# Patient Record
Sex: Female | Born: 1935 | Race: White | Hispanic: No | Marital: Single | Smoking: Never smoker
Health system: Southern US, Community
[De-identification: ages and names within clinical notes are randomized; demographics above are authoritative.]

## PROBLEM LIST (undated history)

## (undated) DIAGNOSIS — Z95810 Presence of automatic (implantable) cardiac defibrillator: Secondary | ICD-10-CM

## (undated) DIAGNOSIS — C8590 Non-Hodgkin lymphoma, unspecified, unspecified site: Secondary | ICD-10-CM

## (undated) DIAGNOSIS — R32 Unspecified urinary incontinence: Secondary | ICD-10-CM

## (undated) DIAGNOSIS — I10 Essential (primary) hypertension: Secondary | ICD-10-CM

## (undated) DIAGNOSIS — M21959 Unspecified acquired deformity of unspecified thigh: Secondary | ICD-10-CM

## (undated) DIAGNOSIS — G629 Polyneuropathy, unspecified: Secondary | ICD-10-CM

## (undated) DIAGNOSIS — R351 Nocturia: Secondary | ICD-10-CM

## (undated) DIAGNOSIS — F329 Major depressive disorder, single episode, unspecified: Secondary | ICD-10-CM

## (undated) DIAGNOSIS — F32A Depression, unspecified: Secondary | ICD-10-CM

## (undated) DIAGNOSIS — N3941 Urge incontinence: Secondary | ICD-10-CM

## (undated) DIAGNOSIS — R000 Tachycardia, unspecified: Secondary | ICD-10-CM

## (undated) DIAGNOSIS — I4891 Unspecified atrial fibrillation: Secondary | ICD-10-CM

## (undated) DIAGNOSIS — I509 Heart failure, unspecified: Secondary | ICD-10-CM

## (undated) HISTORY — DX: Urge incontinence: N39.41

## (undated) HISTORY — DX: Unspecified urinary incontinence: R32

## (undated) HISTORY — DX: Non-Hodgkin lymphoma, unspecified, unspecified site: C85.90

## (undated) HISTORY — DX: Polyneuropathy, unspecified: G62.9

## (undated) HISTORY — PX: TOTAL HIP ARTHROPLASTY: SHX124

## (undated) HISTORY — DX: Depression, unspecified: F32.A

## (undated) HISTORY — DX: Essential (primary) hypertension: I10

## (undated) HISTORY — PX: BONE MARROW TRANSPLANT: SHX200

## (undated) HISTORY — DX: Nocturia: R35.1

## (undated) HISTORY — DX: Tachycardia, unspecified: R00.0

## (undated) HISTORY — PX: COMPLICATED TOTAL HIP PROSTHESIS AND METHYLMETHACRALATE REMOVAL W/O SPACER INSERTION: SHX1385

## (undated) HISTORY — DX: Unspecified acquired deformity of unspecified thigh: M21.959

## (undated) HISTORY — DX: Major depressive disorder, single episode, unspecified: F32.9

---

## 2006-11-13 DIAGNOSIS — C8599 Non-Hodgkin lymphoma, unspecified, extranodal and solid organ sites: Secondary | ICD-10-CM | POA: Insufficient documentation

## 2006-11-13 DIAGNOSIS — I429 Cardiomyopathy, unspecified: Secondary | ICD-10-CM | POA: Insufficient documentation

## 2006-11-13 DIAGNOSIS — C8590 Non-Hodgkin lymphoma, unspecified, unspecified site: Secondary | ICD-10-CM | POA: Insufficient documentation

## 2013-09-24 ENCOUNTER — Ambulatory Visit: Payer: Self-pay | Admitting: Adult Health

## 2013-10-08 ENCOUNTER — Ambulatory Visit: Payer: Self-pay | Admitting: Adult Health

## 2013-10-14 ENCOUNTER — Ambulatory Visit: Payer: Self-pay | Admitting: Adult Health

## 2014-06-09 DIAGNOSIS — R200 Anesthesia of skin: Secondary | ICD-10-CM | POA: Insufficient documentation

## 2014-06-09 DIAGNOSIS — M79604 Pain in right leg: Secondary | ICD-10-CM | POA: Insufficient documentation

## 2014-08-02 DIAGNOSIS — E538 Deficiency of other specified B group vitamins: Secondary | ICD-10-CM | POA: Insufficient documentation

## 2014-08-10 ENCOUNTER — Ambulatory Visit: Payer: Self-pay

## 2014-10-28 ENCOUNTER — Ambulatory Visit: Payer: Self-pay | Admitting: Neurology

## 2015-02-08 ENCOUNTER — Emergency Department
Admit: 2015-02-08 | Disposition: A | Discharge status: Home / Self Care | Payer: Self-pay | Admitting: Emergency Medicine

## 2015-02-08 DIAGNOSIS — I4891 Unspecified atrial fibrillation: Secondary | ICD-10-CM | POA: Insufficient documentation

## 2015-02-08 LAB — BASIC METABOLIC PANEL
Anion Gap: 7 (ref 7–16)
BUN: 19 mg/dL
CHLORIDE: 107 mmol/L
CO2: 23 mmol/L
CREATININE: 0.89 mg/dL
Calcium, Total: 8.8 mg/dL — ABNORMAL LOW
EGFR (African American): >60
EGFR (Non-African Amer.): >60
Glucose: 114 mg/dL — ABNORMAL HIGH
POTASSIUM: 3.8 mmol/L
Sodium: 137 mmol/L

## 2015-02-08 LAB — CBC
HCT: 37.3 % (ref 35.0–47.0)
HGB: 12 g/dL (ref 12.0–16.0)
MCH: 28.8 pg (ref 26.0–34.0)
MCHC: 32.2 g/dL (ref 32.0–36.0)
MCV: 90 fL (ref 80–100)
PLATELETS: 185 10*3/uL (ref 150–440)
RBC: 4.17 10*6/uL (ref 3.80–5.20)
RDW: 14.2 % (ref 11.5–14.5)
WBC: 9.4 10*3/uL (ref 3.6–11.0)

## 2015-02-08 LAB — PRO B NATRIURETIC PEPTIDE: B-Type Natriuretic Peptide: 909 pg/mL — ABNORMAL HIGH

## 2015-02-08 LAB — TROPONIN I
Troponin-I: 0.03 ng/mL
Troponin-I: 0.03 ng/mL

## 2015-03-16 DIAGNOSIS — G4700 Insomnia, unspecified: Secondary | ICD-10-CM | POA: Insufficient documentation

## 2015-03-16 DIAGNOSIS — F0280 Dementia in other diseases classified elsewhere without behavioral disturbance: Secondary | ICD-10-CM

## 2015-03-16 DIAGNOSIS — F0150 Vascular dementia without behavioral disturbance: Secondary | ICD-10-CM | POA: Insufficient documentation

## 2015-03-16 DIAGNOSIS — G309 Alzheimer's disease, unspecified: Secondary | ICD-10-CM

## 2015-04-04 ENCOUNTER — Other Ambulatory Visit: Payer: Self-pay | Admitting: Physician Assistant

## 2015-04-04 DIAGNOSIS — Z1231 Encounter for screening mammogram for malignant neoplasm of breast: Secondary | ICD-10-CM

## 2015-04-08 ENCOUNTER — Ambulatory Visit (INDEPENDENT_AMBULATORY_CARE_PROVIDER_SITE_OTHER): Payer: Medicare Other | Admitting: Urology

## 2015-04-08 ENCOUNTER — Encounter: Payer: Self-pay | Admitting: Urology

## 2015-04-08 VITALS — BP 119/73 | HR 73 | Ht 66.0 in | Wt 149.6 lb

## 2015-04-08 DIAGNOSIS — H409 Unspecified glaucoma: Secondary | ICD-10-CM | POA: Insufficient documentation

## 2015-04-08 DIAGNOSIS — F329 Major depressive disorder, single episode, unspecified: Secondary | ICD-10-CM | POA: Insufficient documentation

## 2015-04-08 DIAGNOSIS — I509 Heart failure, unspecified: Secondary | ICD-10-CM | POA: Insufficient documentation

## 2015-04-08 DIAGNOSIS — G629 Polyneuropathy, unspecified: Secondary | ICD-10-CM | POA: Insufficient documentation

## 2015-04-08 DIAGNOSIS — R350 Frequency of micturition: Secondary | ICD-10-CM

## 2015-04-08 DIAGNOSIS — Z89621 Acquired absence of right hip joint: Secondary | ICD-10-CM | POA: Insufficient documentation

## 2015-04-08 DIAGNOSIS — I10 Essential (primary) hypertension: Secondary | ICD-10-CM | POA: Insufficient documentation

## 2015-04-08 DIAGNOSIS — H269 Unspecified cataract: Secondary | ICD-10-CM | POA: Insufficient documentation

## 2015-04-08 DIAGNOSIS — F32A Depression, unspecified: Secondary | ICD-10-CM | POA: Insufficient documentation

## 2015-04-08 DIAGNOSIS — N3941 Urge incontinence: Secondary | ICD-10-CM

## 2015-04-08 LAB — URINALYSIS, COMPLETE
Bilirubin, UA: NEGATIVE
GLUCOSE, UA: NEGATIVE
KETONES UA: NEGATIVE
LEUKOCYTES UA: NEGATIVE
Nitrite, UA: NEGATIVE
Protein, UA: NEGATIVE
RBC, UA: NEGATIVE
SPEC GRAV UA: 1.025 (ref 1.005–1.030)
Urobilinogen, Ur: 0.2 mg/dL (ref 0.2–1.0)
pH, UA: 6 (ref 5.0–7.5)

## 2015-04-08 LAB — MICROSCOPIC EXAMINATION

## 2015-04-08 NOTE — Progress Notes (Signed)
04/08/2015 2:16 PM   Mindy Cortez September 06, 1935 [id number]  Referring provider: No referring provider defined for this encounter.  Chief Complaint  Patient presents with  . Urinary Incontinence    pt OAB, nocturia, and urge incontinence    HPI: 80 year old female who returns today for reevaluation of nocturia and urge urinary incontinence. Since 05/2014, she has tried on multiple anticholinergic medications including Vesicare, Lisbeth Ply, and most recently Mirbetriq 25 mg and 50 mg. She either had no response to these medications or dry mouth with inability to tolerate the side effects.  Overall, her symptoms are unchanged and she seen no improvement. She is fairly bothered by her symptoms and does desire further intervention.  Previous postvoid residuals have been less than 100 without concern for overflow incontinence.  No history of urinary tract infections or urinary retention.  She continues to get up at least twice per night and has difficulty getting to the bathroom on time even though her toilet is just adjacent to her bedroom. She is in a wheelchair and has difficulty ambulating. Before she is able to get there, she completely soaks her depends which is quite disruptive to her sleep.   She returned today concerned that she may have a UTI but does not verbalize any specific symptoms.    She is very interested in pursuing PNTS as previously discussed but is having difficulty coordinating transportation.    Of note, at last visit, her heart rate was elevated and recommended f/u with PCP.  She has since been dx with Afib and has been started on anticoagulation and digoxin.  PMH: Past Medical History  Diagnosis Date  . Urge incontinence   . Nocturia   . Depression   . Hypertension   . Neuropathy   . Non-Hodgkin lymphoma     Surgical History: Past Surgical History  Procedure Laterality Date  . Total hip arthroplasty  2002,2012    Home Medications:      Medication List       This list is accurate as of: 04/08/15 11:59 PM.  Always use your most recent med list.               apixaban 5 MG Tabs tablet  Commonly known as:  ELIQUIS  Take by mouth.     aspirin EC 325 MG tablet  Take by mouth.     Calcium 250 MG Caps  Take by mouth.     CHONDROITIN SULFATE A PO  Take by mouth.     clonazePAM 1 MG tablet  Commonly known as:  KLONOPIN  Take by mouth.     cyanocobalamin 1000 MCG/ML injection  Commonly known as:  (VITAMIN B-12)  Inject into the muscle.     digoxin 0.125 MG tablet  Commonly known as:  LANOXIN  Take by mouth.     donepezil 10 MG tablet  Commonly known as:  ARICEPT  Take by mouth.     DULoxetine 60 MG capsule  Commonly known as:  CYMBALTA  Take by mouth.     DULoxetine 30 MG capsule  Commonly known as:  CYMBALTA  TK 1 C PO QD     furosemide 20 MG tablet  Commonly known as:  LASIX  TK 1 T PO QD     losartan 25 MG tablet  Commonly known as:  COZAAR  Take by mouth.     LUMIGAN 0.01 % Soln  Generic drug:  bimatoprost  1 drop.     LUMIGAN 0.01 %  Soln  Generic drug:  bimatoprost  INT 1 GTT IN OU QHS     metoprolol 50 MG tablet  Commonly known as:  LOPRESSOR  Take by mouth.     MULTI VITAMIN DAILY PO  Take by mouth.     MYRBETRIQ 50 MG Tb24 tablet  Generic drug:  mirabegron ER  Take by mouth.     PROZAC 20 MG capsule  Generic drug:  FLUoxetine  Take by mouth.     traZODone 100 MG tablet  Commonly known as:  DESYREL  Take by mouth.     Vitamin D (Cholecalciferol) 1000 UNITS Caps  Take by mouth.        Allergies:  Allergies  Allergen Reactions  . Penicillin V Potassium Hives  . Prochlorperazine Nausea And Vomiting    DIZZINESS, DRUNK FEELING. DIZZINESS, DRUNK FEELING.  . Ramipril Other (See Comments)    SEVERE HEADACHE SEVERE HEADACHE  . Rifampin     Severe stomach irriatates cramping and nausea  . Sulfa Antibiotics     TOLD AS A CHILD, MADE PT NERVOUS, EMOTIONAL, TOLD  NOT TO TAKE.  . Cephalexin Rash    RASH, ITCHING. RASH, ITCHING.    Family History: History reviewed. No pertinent family history.  Social History:  reports that she has never smoked. She does not have any smokeless tobacco history on file. She reports that she drinks alcohol. She reports that she does not use illicit drugs.    Physical Exam: BP 119/73 mmHg  Pulse 73  Ht 5\' 6"  (1.676 m)  Wt 149 lb 9.6 oz (67.858 kg)  BMI 24.16 kg/m2  Constitutional:  Alert and oriented, No acute distress. In wheel chair.   HEENT: [place] AT, moist mucus membranes.  Trachea midline, no masses. Cardiovascular: No clubbing, cyanosis, or edema. Respiratory: Normal respiratory effort, no increased work of breathing. GI: Abdomen is soft, nontender, nondistended, no abdominal masses.  Skin: No rashes, bruises or suspicious lesions. Neurologic: Grossly intact, no focal deficits, moving all 4 extremities. Psychiatric: Somewhat irritated, agitated today.    Laboratory Data: Lab Results  Component Value Date   WBC 9.4 02/08/2015   HGB 12.0 02/08/2015   HCT 37.3 02/08/2015   MCV 90 02/08/2015   PLT 185 02/08/2015    Lab Results  Component Value Date   CREATININE 0.89 02/08/2015   Urinalysis Results for orders placed or performed in visit on 04/08/15  Microscopic Examination  Result Value Ref Range   WBC, UA 0-5 0 -  5 /hpf   RBC, UA 0-2 0 -  2 /hpf   Epithelial Cells (non renal) 0-10 0 - 10 /hpf   Bacteria, UA Few None seen/Few  Urinalysis, Complete  Result Value Ref Range   Specific Gravity, UA 1.025 1.005 - 1.030   pH, UA 6.0 5.0 - 7.5   Color, UA Yellow Yellow   Appearance Ur Clear Clear   Leukocytes, UA Negative Negative   Protein, UA Negative Negative/Trace   Glucose, UA Negative Negative   Ketones, UA Negative Negative   RBC, UA Negative Negative   Bilirubin, UA Negative Negative   Urobilinogen, Ur 0.2 0.2 - 1.0 mg/dL   Nitrite, UA Negative Negative   Microscopic Examination See  below:     Assessment & Plan:   80 year old female with OAB, nocturia, and urge incontience. She has now failed multiple anticholinergics including Vesicare and Toviaz, Myrbetriq 25 mg and 50 mg which does not seem to be improving her symptoms. I continue to feel  that she is an excellent candidate for tibial nerve stimulation. Discussed the procedures today in detail including need for multiple weekly sessions 12 and that the effect does not often seen until she's had several treatments. She has been having trouble getting a ride but thinks that she'll be able to work this out. She would like to proceed. - she does have an INACTIVE defibulator which has a dead battery, need cardiac clearance prior to PTNS  1. Urinary incontinence, urge - Urinalysis, Complete  2. Urinary frequency -no evidence of UTI today    Return for arrange for PTNS.  Hollice Espy, MD  [facility] [address], [place], [place] [address] [phone]

## 2015-04-10 ENCOUNTER — Encounter: Payer: Self-pay | Admitting: Urology

## 2015-04-11 ENCOUNTER — Telehealth: Payer: Self-pay | Admitting: Urology

## 2015-04-11 NOTE — Telephone Encounter (Signed)
The patient's daughter called and cancelled all of her PTNS appts for her mother. She said that she has alzheimer's and that the family does not want to proceed with this. I have cancelled all of her appointments.

## 2015-04-13 ENCOUNTER — Telehealth: Payer: Self-pay | Admitting: Urology

## 2015-04-13 NOTE — Telephone Encounter (Signed)
Spoke with patient in regards to scheduling her for PTNS therapy. I discussed with her in detail the fact that she has a defibrillator and even though it is inactive that we would like to get clearance from her cardiologist. Patient insisted that there was nothing to check on and the defibrillator did not work and she does not have a cardiologist and does not remember the name of the cardiologist that placed her device it was a long time ago.  Patient would like to go ahead and schedule for PTNS treatments, she was scheduled for 05-11-15@2 :30pm for her first treatment. A letter with PTNS info and apt date and time was mailed to the patient/SW

## 2015-04-13 NOTE — Telephone Encounter (Signed)
Ok, noted.    Hollice Espy, MD

## 2015-04-26 ENCOUNTER — Ambulatory Visit: Payer: Self-pay | Attending: Physician Assistant

## 2015-05-06 ENCOUNTER — Encounter: Payer: Self-pay | Admitting: *Deleted

## 2015-05-06 ENCOUNTER — Other Ambulatory Visit: Payer: Self-pay | Admitting: *Deleted

## 2015-05-11 ENCOUNTER — Ambulatory Visit: Payer: Medicare Other | Admitting: Urology

## 2015-05-11 ENCOUNTER — Encounter: Payer: Self-pay | Admitting: Internal Medicine

## 2015-05-11 ENCOUNTER — Inpatient Hospital Stay: Payer: Medicare Other | Attending: Internal Medicine | Admitting: Internal Medicine

## 2015-05-11 ENCOUNTER — Encounter: Payer: Self-pay | Admitting: Urology

## 2015-05-11 ENCOUNTER — Inpatient Hospital Stay: Payer: Medicare Other

## 2015-05-11 VITALS — BP 108/68 | HR 74 | Temp 97.5°F | Resp 18 | Ht 66.0 in | Wt 149.6 lb

## 2015-05-11 DIAGNOSIS — C8590 Non-Hodgkin lymphoma, unspecified, unspecified site: Secondary | ICD-10-CM

## 2015-05-11 DIAGNOSIS — Z8572 Personal history of non-Hodgkin lymphomas: Secondary | ICD-10-CM | POA: Diagnosis not present

## 2015-05-11 DIAGNOSIS — R590 Localized enlarged lymph nodes: Secondary | ICD-10-CM | POA: Insufficient documentation

## 2015-05-11 LAB — CBC WITH DIFFERENTIAL/PLATELET
BASOS ABS: 0.1 10*3/uL (ref 0–0.1)
Basophils Relative: 1 %
Eosinophils Absolute: 0.4 10*3/uL (ref 0–0.7)
Eosinophils Relative: 4 %
HEMATOCRIT: 35.4 % (ref 35.0–47.0)
Hemoglobin: 11.5 g/dL — ABNORMAL LOW (ref 12.0–16.0)
LYMPHS ABS: 1.5 10*3/uL (ref 1.0–3.6)
Lymphocytes Relative: 18 %
MCH: 28.5 pg (ref 26.0–34.0)
MCHC: 32.5 g/dL (ref 32.0–36.0)
MCV: 87.5 fL (ref 80.0–100.0)
Monocytes Absolute: 1.5 10*3/uL — ABNORMAL HIGH (ref 0.2–0.9)
Monocytes Relative: 17 %
Neutro Abs: 5.1 10*3/uL (ref 1.4–6.5)
Neutrophils Relative %: 60 %
PLATELETS: 234 10*3/uL (ref 150–440)
RBC: 4.04 MIL/uL (ref 3.80–5.20)
RDW: 16.2 % — ABNORMAL HIGH (ref 11.5–14.5)
WBC: 8.5 10*3/uL (ref 3.6–11.0)

## 2015-05-11 LAB — CREATININE, SERUM
Creatinine, Ser: 0.77 mg/dL (ref 0.44–1.00)
GFR calc non Af Amer: >60 mL/min (ref >60)

## 2015-05-11 LAB — LACTATE DEHYDROGENASE: LDH: 204 U/L — AB (ref 98–192)

## 2015-05-11 NOTE — Progress Notes (Signed)
[place]  [phone] [phone]     ID: Trevor Iha OB: [birth year]  MR#: [id number]  TIR#:[id number]  Patient Care Team: Marinda Elk, MD as PCP - General (Physician Assistant)  CHIEF COMPLAINT/DIAGNOSIS:  History of recurrent lymphoma in the 1990s (details obtained from patient and her daughter's history. 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 Bone marrow biopsy study at that time reported normocellular marrow with lymphoid aggregates, nondiagnostic of lymphoma). Patient currently with persistent tenderness and mild swelling in the left angle of the mandible -  referred here for Oncology evaluation to rule out recurrent lymphoma.  HISTORY OF PRESENT ILLNESS:  Mindy Cortez is a 80 year old female with past medical history significant for hypertension, depression, tachycardia, urinary symptoms as below, non-Hodgkin's lymphoma in the 1990s. 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 Bone marrow biopsy study at that time reported normocellular marrow with lymphoid aggregates, nondiagnostic of lymphoma. According to patient and her daughter present, 16 she presented with multiple areas of adenopathy in the right inguinal area, left axilla/breast area, neck and was diagnosed to have aggressive lymphoma. She underwent chemotherapy and ventricular remission. She relapsed in 1994 mainly in the right inguinal area and possibly other lymph nodes, states that she received chemotherapy and radiation therapy and then had bone marrow transplant (autologous). States that she relapsed in 1995 in the right axilla and possibly in the right  groin but unsure and got more chemotherapy and radiation therapy and since then has been in remission. She has occasional minor sweats but relates this to palpitations and sees Dr. Ubaldo Glassing. Appetite and weight are steady. More recently patient has persistent pain and tenderness in the left angle of the mandible upper neck area and she strongly feels that there is an enlarged lymph node and is very concerned about relapsed lymphoma. Otherwise no new bone pains.   REVIEW OF SYSTEMS:   ROS CONSTITUTIONAL: As in HPI above. No chills, fever or sweats.    ENT:  No headache, dizziness or epistaxis. No ear pain. No sinus symptoms. RESPIRATORY:   No cough.  No shortness of breath. No wheezing. No hemoptysis. CARDIAC:  No palpitations.  No retrosternal chest pain. No orthopnea, PND. GI:  No abdominal pain, nausea or vomiting. No diarrhea.   GU:  No dysuria or hematuria.  SKIN: No rashes or pruritus. HEMATOLOGIC: denies bleeding symptoms MUSCULOSKELETAL:  No new bone pains.  EXTREMITY:  No new swelling or pain.  NEURO:  Chronically poor memory from dementia. Denies new focal weakness. No numbness or tingling of extremities.  No seizures.   ENDOCRINE:  No polyuria or polydipsia.   PAST MEDICAL HISTORY: Reviewed. Past Medical History  Diagnosis Date  . Urge incontinence   . Nocturia   . Depression   . Hypertension   . Neuropathy   . Non-Hodgkin lymphoma   . 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 HTN (hypertension)   . Tachycardia   . Urinary incontinence     PAST SURGICAL HISTORY: Reviewed. Past Surgical History  Procedure Laterality Date  . Total hip arthroplasty  2002,2012  . Complicated total hip prosthesis and methylmethacralate removal w/o spacer insertion    .  Bone marrow transplant      FAMILY HISTORY: Reviewed. Family History  Problem Relation Age of Onset  . Osteosarcoma Father   . Heart disease Mother   . Heart disease Father    . Breast cancer Mother     SOCIAL HISTORY: Reviewed. History  Substance Use Topics  . Smoking status: Never Smoker   . Smokeless tobacco: Not on file  . Alcohol Use: 0.0 oz/week    0 Standard drinks or equivalent per week     Comment: rarely    Allergies  Allergen Reactions  . Penicillin V Potassium Hives  . Prochlorperazine Nausea And Vomiting    DIZZINESS, DRUNK FEELING. DIZZINESS, DRUNK FEELING.  . Ramipril Other (See Comments)    SEVERE HEADACHE SEVERE HEADACHE  . Rifampin     Severe stomach irriatates cramping and nausea  . Sulfa Antibiotics     TOLD AS A CHILD, MADE PT NERVOUS, EMOTIONAL, TOLD NOT TO TAKE.  . Cephalexin Rash    RASH, ITCHING. RASH, ITCHING.    Current Outpatient Prescriptions  Medication Sig Dispense Refill  . apixaban (ELIQUIS) 5 MG TABS tablet Take by mouth.    Marland Kitchen aspirin EC 325 MG tablet Take by mouth.    . bimatoprost (LUMIGAN) 0.01 % SOLN 1 drop.     . Calcium 250 MG CAPS Take by mouth.    . CHONDROITIN SULFATE A PO Take by mouth.    . clonazePAM (KLONOPIN) 1 MG tablet Take by mouth.    . cyanocobalamin (,VITAMIN B-12,) 1000 MCG/ML injection Inject into the muscle.    . digoxin (LANOXIN) 0.125 MG tablet Take by mouth.    . donepezil (ARICEPT) 10 MG tablet Take by mouth.    . DULoxetine (CYMBALTA) 30 MG capsule TK 1 C PO QD  5  . DULoxetine (CYMBALTA) 60 MG capsule Take by mouth.    Marland Kitchen FLUoxetine (PROZAC) 20 MG capsule Take by mouth.    . furosemide (LASIX) 20 MG tablet TK 1 T PO QD  0  . losartan (COZAAR) 25 MG tablet Take by mouth.    . LUMIGAN 0.01 % SOLN INT 1 GTT IN OU QHS  5  . metoprolol (LOPRESSOR) 50 MG tablet Take by mouth.    . mirabegron ER (MYRBETRIQ) 50 MG TB24 tablet Take by mouth.    . Multiple Vitamin (MULTI VITAMIN DAILY PO) Take by mouth.    Marland Kitchen rOPINIRole (REQUIP) 1 MG tablet Take by mouth.    . Vitamin D, Cholecalciferol, 1000 UNITS CAPS Take by mouth.    . traZODone (DESYREL) 100 MG tablet Take by mouth.     No  current facility-administered medications for this visit.    PHYSICAL EXAM: Filed Vitals:   05/11/15 1048  BP: 108/68  Pulse: 74  Temp: 97.5 F (36.4 C)  Resp: 18     Body mass index is 24.15 kg/(m^2).       GENERAL: Patient is alert and oriented and in no acute distress. There is no icterus. HEENT: EOMs intact. Oral exam negative for thrush or lesions. There is thickening and tenderness present in the left angle of the mandible, no clear-cut lymph node palpable. No other cervical lymphadenopathy. CVS: S1S2, regular LUNGS: Bilaterally clear to auscultation, no rhonchi. ABDOMEN: Soft, nontender. No hepatosplenomegaly clinically.  NEURO: grossly nonfocal, cranial nerves are intact.   EXTREMITIES: No pedal edema. LYMPHATICS: No palpable adenopathy in axillary or inguinal areas. SKIN: No major bruising. No rash MUSCULOSKELETAL: No obvious joint redness or swelling  LAB RESULTS:    Component Value Date/Time   NA 137 02/08/2015 0313   K 3.8 02/08/2015 0313   CL 107 02/08/2015 0313   CO2 23 02/08/2015 0313   GLUCOSE 114* 02/08/2015 0313   BUN 19 02/08/2015 0313   CREATININE 0.77 05/11/2015 1203   CREATININE 0.89 02/08/2015 0313   CALCIUM 8.8* 02/08/2015 0313   GFRNONAA >60 05/11/2015 1203   GFRNONAA >60 02/08/2015 0313   GFRAA >60 05/11/2015 1203   GFRAA >60 02/08/2015 0313   Lab Results  Component Value Date   WBC 8.5 05/11/2015   NEUTROABS 5.1 05/11/2015   HGB 11.5* 05/11/2015   HCT 35.4 05/11/2015   MCV 87.5 05/11/2015   PLT 234 05/11/2015     STUDIES: Surgical Pathology report of left axillary lymph node biopsy on 06/04/1990 which reported malignant lymphoma, 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 Bone marrow biopsy study at that time reported normocellular marrow with lymphoid aggregates, nondiagnostic of lymphoma.   ASSESSMENT / PLAN:   History of recurrent lymphoma in the 1990s (details obtained from patient and her daughter's history. 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 Bone marrow biopsy study at that time reported normocellular marrow with lymphoid aggregates, nondiagnostic of lymphoma).  Patient currently with persistent tenderness and mild swelling in the left angle of the mandible -  referred here for Oncology evaluation to rule out recurrent Have reviewed records sent benefiting physician and history provided by patient/her daughter. Have explained that on clinical exam there is no obvious superficial lymphadenopathy in the neck, axillary or inguinal areas, however, she does have significant tenderness and mild prominence in the left upper neck just behind the angle of the mandible on the left side and she has induration/thickening in the right groin area but this could be secondary to scar tissue and patient states that it is mostly unchanged for many years now. She does give a history of upper respiratory symptoms including mild sore throat, mild intermittent cough and sinus drainage but does not want to try and take antibiotic due to concern of side effects. She also does not want ENT referral unless indicated by CAT scan or worsening symptoms. Plan is to obtain labs today including CBC/differential, creatinine LDH, peripheral blood flow cytometry and schedule CT scan of the neck with contrast to evaluate for abnormal lymphadenopathy. Will see her back in about 2 weeks and make further plan of management.     In between visits, the patient has been advised to call or come to the ER in case of fevers, chills, bleeding, acute sickness, or worsening symptoms. Patient is agreeable to this plan.   Leia Alf, MD   05/11/2015 9:48 PM

## 2015-05-11 NOTE — Progress Notes (Signed)
Patient was referred here by Boykin Reaper, PA. Patient has a history of Non-Hodgkin's Lymphoma which was first dx'ed in 1991. Patient had a relapse in 1994 and had a bone marrow transplant in August 1994. She also had another relapse in August 1995. Patient has been having constant jaw pain for 6 months. And she is certain this pain is related to her prior lymphoma. She has not seen an oncologist since roughly 2005.

## 2015-05-12 ENCOUNTER — Encounter: Payer: Self-pay | Admitting: Urology

## 2015-05-13 ENCOUNTER — Other Ambulatory Visit: Payer: Self-pay | Admitting: Physician Assistant

## 2015-05-13 ENCOUNTER — Ambulatory Visit
Admission: RE | Admit: 2015-05-13 | Discharge: 2015-05-13 | Disposition: A | Discharge status: Home / Self Care | Payer: Medicare Other | Source: Ambulatory Visit | Attending: Physician Assistant | Admitting: Physician Assistant

## 2015-05-13 DIAGNOSIS — M79604 Pain in right leg: Secondary | ICD-10-CM | POA: Diagnosis not present

## 2015-05-13 DIAGNOSIS — M7989 Other specified soft tissue disorders: Secondary | ICD-10-CM

## 2015-05-13 DIAGNOSIS — R600 Localized edema: Secondary | ICD-10-CM | POA: Diagnosis not present

## 2015-05-16 LAB — COMP PANEL: LEUKEMIA/LYMPHOMA

## 2015-05-17 ENCOUNTER — Ambulatory Visit: Payer: Medicare Other

## 2015-05-18 ENCOUNTER — Ambulatory Visit: Payer: Medicare Other | Admitting: Urology

## 2015-05-20 ENCOUNTER — Ambulatory Visit
Admission: RE | Admit: 2015-05-20 | Discharge: 2015-05-20 | Disposition: A | Discharge status: Home / Self Care | Payer: Medicare Other | Source: Ambulatory Visit | Attending: Internal Medicine | Admitting: Internal Medicine

## 2015-05-20 DIAGNOSIS — C8590 Non-Hodgkin lymphoma, unspecified, unspecified site: Secondary | ICD-10-CM

## 2015-05-20 DIAGNOSIS — J984 Other disorders of lung: Secondary | ICD-10-CM | POA: Insufficient documentation

## 2015-05-20 MED ORDER — IOHEXOL 300 MG/ML  SOLN
75.0000 mL | Freq: Once | INTRAMUSCULAR | Status: AC | PRN
  Administered 2015-05-20: 75 mL via INTRAVENOUS

## 2015-05-25 ENCOUNTER — Ambulatory Visit: Payer: Medicare Other | Admitting: Urology

## 2015-05-27 ENCOUNTER — Ambulatory Visit: Payer: Medicare Other | Admitting: Internal Medicine

## 2015-05-30 ENCOUNTER — Telehealth: Payer: Self-pay | Admitting: *Deleted

## 2015-05-30 NOTE — Telephone Encounter (Signed)
rcvd in basket from scheduler that daughter did not want to come to appt unless nec. Because pt has alzheimer's and it is difficult to get pt to appt.  She has PCP Boykin Reaper that she sees once a month.  Spoke with daughter Izora Gala per Ma Hillock his findings were anemia with hgb11.5, monocytes 1.5 and based on that rec: cbc once every 3 months to monitor, ct showed RUL 1.6 diameter area that looks like scarring. Usually would have pt go to pulmonary.  Daughter wants the info including and scan and rec. Sent to PCP and she could send pt somewhere if they felt it was [place]. But could get her labs monitored at [facility] because pt comes there monthly for b 12 inj.  I have contacted PCP office and faxed over notes, labs, scan and cover sheet in regards to above. Daughter is agreeable to plan and her f/u appt for pt has been cancelled per req. Of daughter

## 2015-06-03 ENCOUNTER — Ambulatory Visit: Payer: Medicare Other | Admitting: Internal Medicine

## 2015-06-08 ENCOUNTER — Ambulatory Visit: Payer: Medicare Other | Admitting: Urology

## 2015-06-15 ENCOUNTER — Ambulatory Visit: Payer: Medicare Other | Admitting: Urology

## 2015-06-17 NOTE — Progress Notes (Signed)
This was the pt that her daughter called  On 8/8 and said unless it was absolutely nec. Because of her alzheimers and it is so difficult to get her to appt. She has PCP Mimi Mclaughlin and she goes there once a month for b 12 inj. And if I send the notes over about ct and rec and cbc rec. Then she will take advice from [facility].  I called the PCP office and let them know the reason for my call and faxed all info over for review including your note and so that is why she did not come to appt.

## 2015-06-22 ENCOUNTER — Ambulatory Visit: Payer: Medicare Other | Admitting: Urology

## 2015-06-29 ENCOUNTER — Ambulatory Visit: Payer: Medicare Other | Admitting: Urology

## 2016-01-06 ENCOUNTER — Emergency Department
Admission: EM | Admit: 2016-01-06 | Discharge: 2016-01-06 | Disposition: A | Discharge status: Skilled Nursing Facility | Payer: Medicare Other | Attending: Emergency Medicine | Admitting: Emergency Medicine

## 2016-01-06 ENCOUNTER — Emergency Department: Payer: Medicare Other

## 2016-01-06 ENCOUNTER — Encounter: Payer: Self-pay | Admitting: Emergency Medicine

## 2016-01-06 DIAGNOSIS — Z881 Allergy status to other antibiotic agents status: Secondary | ICD-10-CM | POA: Insufficient documentation

## 2016-01-06 DIAGNOSIS — N3091 Cystitis, unspecified with hematuria: Secondary | ICD-10-CM | POA: Diagnosis not present

## 2016-01-06 DIAGNOSIS — F329 Major depressive disorder, single episode, unspecified: Secondary | ICD-10-CM | POA: Insufficient documentation

## 2016-01-06 DIAGNOSIS — Z880 Allergy status to penicillin: Secondary | ICD-10-CM | POA: Diagnosis not present

## 2016-01-06 DIAGNOSIS — Z888 Allergy status to other drugs, medicaments and biological substances status: Secondary | ICD-10-CM | POA: Insufficient documentation

## 2016-01-06 DIAGNOSIS — N3090 Cystitis, unspecified without hematuria: Secondary | ICD-10-CM

## 2016-01-06 DIAGNOSIS — I10 Essential (primary) hypertension: Secondary | ICD-10-CM | POA: Diagnosis not present

## 2016-01-06 DIAGNOSIS — R319 Hematuria, unspecified: Secondary | ICD-10-CM | POA: Diagnosis present

## 2016-01-06 DIAGNOSIS — C8599 Non-Hodgkin lymphoma, unspecified, extranodal and solid organ sites: Secondary | ICD-10-CM | POA: Insufficient documentation

## 2016-01-06 LAB — COMPREHENSIVE METABOLIC PANEL
ALK PHOS: 80 U/L (ref 38–126)
ALT: 17 U/L (ref 14–54)
AST: 20 U/L (ref 15–41)
Albumin: 3.8 g/dL (ref 3.5–5.0)
Anion gap: 8 (ref 5–15)
BILIRUBIN TOTAL: 0.3 mg/dL (ref 0.3–1.2)
BUN: 13 mg/dL (ref 6–20)
CALCIUM: 9 mg/dL (ref 8.9–10.3)
CO2: 26 mmol/L (ref 22–32)
CREATININE: 0.8 mg/dL (ref 0.44–1.00)
Chloride: 95 mmol/L — ABNORMAL LOW (ref 101–111)
Glucose, Bld: 102 mg/dL — ABNORMAL HIGH (ref 65–99)
Potassium: 4.1 mmol/L (ref 3.5–5.1)
Sodium: 129 mmol/L — ABNORMAL LOW (ref 135–145)
Total Protein: 8.4 g/dL — ABNORMAL HIGH (ref 6.5–8.1)

## 2016-01-06 LAB — URINALYSIS COMPLETE WITH MICROSCOPIC (ARMC ONLY)
Bilirubin Urine: NEGATIVE
Glucose, UA: NEGATIVE mg/dL
Ketones, ur: NEGATIVE mg/dL
Nitrite: NEGATIVE
PROTEIN: 100 mg/dL — AB
SPECIFIC GRAVITY, URINE: 1.014 (ref 1.005–1.030)
SQUAMOUS EPITHELIAL / LPF: NONE SEEN
pH: 6 (ref 5.0–8.0)

## 2016-01-06 LAB — CBC WITH DIFFERENTIAL/PLATELET
Basophils Absolute: 0.1 10*3/uL (ref 0–0.1)
Basophils Relative: 1 %
Eosinophils Absolute: 0.3 10*3/uL (ref 0–0.7)
Eosinophils Relative: 2 %
HCT: 34.1 % — ABNORMAL LOW (ref 35.0–47.0)
HEMOGLOBIN: 11.1 g/dL — AB (ref 12.0–16.0)
LYMPHS ABS: 1.5 10*3/uL (ref 1.0–3.6)
LYMPHS PCT: 10 %
MCH: 28.2 pg (ref 26.0–34.0)
MCHC: 32.7 g/dL (ref 32.0–36.0)
MCV: 86.2 fL (ref 80.0–100.0)
Monocytes Absolute: 1.9 10*3/uL — ABNORMAL HIGH (ref 0.2–0.9)
Monocytes Relative: 13 %
NEUTROS PCT: 74 %
Neutro Abs: 11 10*3/uL — ABNORMAL HIGH (ref 1.4–6.5)
Platelets: 267 10*3/uL (ref 150–440)
RBC: 3.95 MIL/uL (ref 3.80–5.20)
RDW: 16.1 % — ABNORMAL HIGH (ref 11.5–14.5)
WBC: 14.8 10*3/uL — AB (ref 3.6–11.0)

## 2016-01-06 LAB — PROTIME-INR
INR: 1.23
PROTHROMBIN TIME: 15.7 s — AB (ref 11.4–15.0)

## 2016-01-06 MED ORDER — CIPROFLOXACIN HCL 500 MG PO TABS: 500.0000 mg | ORAL_TABLET | Freq: Two times a day (BID) | ORAL | Status: DC

## 2016-01-06 MED ORDER — PHENAZOPYRIDINE HCL 200 MG PO TABS: 200.0000 mg | ORAL_TABLET | Freq: Three times a day (TID) | ORAL | Status: AC | PRN

## 2016-01-06 MED ORDER — DEXTROSE 5 % IV SOLN
1.0000 g | Freq: Once | INTRAVENOUS | Status: AC
  Administered 2016-01-06: 1 g via INTRAVENOUS
  Filled 2016-01-06: qty 10

## 2016-01-06 NOTE — ED Notes (Signed)
Pt arrived via EMS from [facility] for complaints of blood in urine and lower abdominal pain. EMS reports 157/88, CBG 78. Pt in NAD on arrival.

## 2016-01-06 NOTE — Discharge Instructions (Signed)

## 2016-01-06 NOTE — ED Notes (Signed)
Pt reports having intermittent lower abdominal pain. Pt reports pain is sharp and stabbing when it comes.

## 2016-01-06 NOTE — ED Provider Notes (Signed)
[facility] Emergency Department Provider Note     Time seen: ----------------------------------------- 8:54 AM on 01/06/2016 -----------------------------------------    I have reviewed the triage vital signs and the nursing notes.   HISTORY  Chief Complaint Hematuria    HPI Mindy Cortez is a 81 y.o. female who presents to the ER for vaginal bleeding and lower abdominal pain that started this morning. Patient states around 3:00 this morning she started seeing blood coming from her vagina and dull lower abdominal pain. EMS reported she had complained of blood in her urine but she complained to me of vaginal bleeding. She denies a history of this before. She does take Eliquis.   Past Medical History  Diagnosis Date  . Urge incontinence   . Nocturia   . Depression   . Hypertension   . Neuropathy   . 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 HTN (hypertension)   . Tachycardia   . Urinary incontinence   . Non-Hodgkin lymphoma     Bone Marrow Transplant with chemo + Rad tx's.    Patient Active Problem List   Diagnosis Date Noted  . Acquired absence of right hip 04/08/2015  . Bilateral cataracts 04/08/2015  . CCF (congestive cardiac failure) ([place]) 04/08/2015  . Clinical depression 04/08/2015  . Glaucoma 04/08/2015  . BP (high blood pressure) 04/08/2015  . Neuropathy ([place]) 04/08/2015  . Insomnia, persistent 03/16/2015  . Mixed Alzheimer's and vascular dementia 03/16/2015  . Atrial fibrillation with rapid ventricular response ([place]) 02/08/2015  . B12 deficiency 08/02/2014  . Leg pain, right 06/09/2014  . Leg numbness 06/09/2014  . Endomyocardial disease ([place]) 11/13/2006  . Cardiac failure left ([place]) 11/13/2006  . Lymphoma, non-Hodgkin's ([place]) 11/13/2006  . Infection and inflammatory reaction due to internal prosthetic device, implant, and graft ([place]) 11/13/2006  . Infection of breast implant ([place])  11/13/2006  . Lymphoma of extranodal and solid organ sites ([place]) 11/13/2006  . Cardiomyopathy ([place]) 11/13/2006    Past Surgical History  Procedure Laterality Date  . Total hip arthroplasty  2002,2012  . Complicated total hip prosthesis and methylmethacralate removal w/o spacer insertion    . Bone marrow transplant      Allergies Penicillin v potassium; Prochlorperazine; Ramipril; Rifampin; Sulfa antibiotics; and Cephalexin  Social History Social History  Substance Use Topics  . Smoking status: Never Smoker   . Smokeless tobacco: Not on file  . Alcohol Use: 0.0 oz/week    0 Standard drinks or equivalent per week     Comment: rarely    Review of Systems Constitutional: Negative for fever. Eyes: Negative for visual changes. ENT: Negative for sore throat. Cardiovascular: Negative for chest pain. Respiratory: Negative for shortness of breath. Gastrointestinal: Positive for lower abdominal pain Genitourinary: Negative for dysuria. Positive for vaginal bleeding Musculoskeletal: Negative for back pain. Skin: Negative for rash. Neurological: Negative for headaches, focal weakness or numbness.  ____________________________________________   PHYSICAL EXAM:  VITAL SIGNS: ED Triage Vitals  Enc Vitals Group     BP --      Pulse --      Resp --      Temp --      Temp src --      SpO2 --      Weight --      Height --      Head Cir --      Peak Flow --      Pain Score --  Pain Loc --      Pain Edu? --      Excl. in [place]? --     Constitutional: Alert, Well appearing and in no distress. Eyes: Conjunctivae are normal. PERRL. Normal extraocular movements. ENT   Head: Normocephalic and atraumatic.   Nose: No congestion/rhinnorhea.   Mouth/Throat: Mucous membranes are moist.   Neck: No stridor. Cardiovascular: Normal rate, regular rhythm. Normal and symmetric distal pulses are present in all extremities. No murmurs, rubs, or gallops. Respiratory: Normal  respiratory effort without tachypnea nor retractions. Breath sounds are clear and equal bilaterally. No wheezes/rales/rhonchi. Gastrointestinal: Soft and nontender. No distention. No abdominal bruits.  Genitourinary: No external bleeding is noted. Vaginal exam reveals no blood vaginally Musculoskeletal: Nontender with normal range of motion in all extremities. No joint effusions.  No lower extremity tenderness nor edema. Neurologic:  Normal speech and language. No gross focal neurologic deficits are appreciated.  Skin:  Skin is warm, dry and intact. No rash noted. Psychiatric: Mood and affect are normal.  ____________________________________________  ED COURSE:  Pertinent labs & imaging results that were available during my care of the patient were reviewed by me and considered in my medical decision making (see chart for details). Patient is in no acute distress, difficult to determine if this is bladder or uterine bleeding. She may require ultrasound basic labs. ____________________________________________    LABS (pertinent positives/negatives)  Labs Reviewed  CBC WITH DIFFERENTIAL/PLATELET - Abnormal; Notable for the following:    WBC 14.8 (*)    Hemoglobin 11.1 (*)    HCT 34.1 (*)    RDW 16.1 (*)    Neutro Abs 11.0 (*)    Monocytes Absolute 1.9 (*)    All other components within normal limits  COMPREHENSIVE METABOLIC PANEL - Abnormal; Notable for the following:    Sodium 129 (*)    Chloride 95 (*)    Glucose, Bld 102 (*)    Total Protein 8.4 (*)    All other components within normal limits  PROTIME-INR - Abnormal; Notable for the following:    Prothrombin Time 15.7 (*)    All other components within normal limits  URINALYSIS COMPLETEWITH MICROSCOPIC ([facility] ONLY) - Abnormal; Notable for the following:    Color, Urine YELLOW (*)    APPearance CLOUDY (*)    Hgb urine dipstick 3+ (*)    Protein, ur 100 (*)    Leukocytes, UA 1+ (*)    Bacteria, UA RARE (*)    All other  components within normal limits  URINE CULTURE    RADIOLOGY Images were viewed by me  Pelvic ultrasound IMPRESSION: Abnormally thickened endometrium at 10 mm, heterogeneous appearance. In the setting of post-menopausal bleeding, endometrial sampling is indicated to exclude carcinoma. If results are benign, sonohysterogram should be considered for focal lesion work-up. (Ref: Radiological Reasoning: Algorithmic Workup of Abnormal Vaginal Bleeding with Endovaginal Sonography and Sonohysterography. [id number]) ____________________________________________  FINAL ASSESSMENT AND PLAN  Cystitis  Plan: Patient with labs and imaging as dictated above. Blood is likely coming from cystitis secondary to infection. She is received IV Rocephin, urine culture has been sent. I did obtain an ultrasound and she does need OB/GYN follow-up and has been referred to same. She is stable for discharge.   Earleen Newport, MD   Earleen Newport, MD 01/06/16 1106

## 2016-01-06 NOTE — ED Notes (Signed)
Patient transported to Ultrasound 

## 2016-01-10 LAB — URINE CULTURE
Culture: >=100000
Special Requests: NORMAL

## 2016-04-19 ENCOUNTER — Ambulatory Visit
Admission: RE | Admit: 2016-04-19 | Discharge: 2016-04-19 | Disposition: A | Discharge status: Home / Self Care | Payer: Medicare Other | Source: Ambulatory Visit | Attending: Physician Assistant | Admitting: Physician Assistant

## 2016-04-19 ENCOUNTER — Other Ambulatory Visit: Payer: Self-pay | Admitting: Physician Assistant

## 2016-04-19 DIAGNOSIS — Z1231 Encounter for screening mammogram for malignant neoplasm of breast: Secondary | ICD-10-CM | POA: Diagnosis not present

## 2016-04-19 DIAGNOSIS — R921 Mammographic calcification found on diagnostic imaging of breast: Secondary | ICD-10-CM | POA: Insufficient documentation

## 2016-04-25 ENCOUNTER — Other Ambulatory Visit: Payer: Self-pay | Admitting: Physician Assistant

## 2016-04-25 DIAGNOSIS — R921 Mammographic calcification found on diagnostic imaging of breast: Secondary | ICD-10-CM

## 2016-05-09 ENCOUNTER — Other Ambulatory Visit: Payer: Self-pay | Admitting: Physician Assistant

## 2016-05-09 ENCOUNTER — Ambulatory Visit
Admission: RE | Admit: 2016-05-09 | Discharge: 2016-05-09 | Disposition: A | Discharge status: Home / Self Care | Payer: Medicare Other | Source: Ambulatory Visit | Attending: Physician Assistant | Admitting: Physician Assistant

## 2016-05-09 DIAGNOSIS — R921 Mammographic calcification found on diagnostic imaging of breast: Secondary | ICD-10-CM

## 2016-05-14 ENCOUNTER — Inpatient Hospital Stay
Admission: RE | Admit: 2016-05-14 | Discharge: 2016-05-14 | Disposition: A | Discharge status: Home / Self Care | Payer: Self-pay | Source: Ambulatory Visit | Attending: *Deleted | Admitting: *Deleted

## 2016-05-14 ENCOUNTER — Other Ambulatory Visit: Payer: Self-pay | Admitting: *Deleted

## 2016-05-14 DIAGNOSIS — Z9289 Personal history of other medical treatment: Secondary | ICD-10-CM

## 2016-06-12 ENCOUNTER — Other Ambulatory Visit: Payer: Self-pay | Admitting: Vascular Surgery

## 2016-06-15 ENCOUNTER — Other Ambulatory Visit
Admission: RE | Admit: 2016-06-15 | Discharge: 2016-06-15 | Disposition: A | Discharge status: Home / Self Care | Payer: Medicare Other | Source: Ambulatory Visit | Attending: Vascular Surgery | Admitting: Vascular Surgery

## 2016-06-15 DIAGNOSIS — I739 Peripheral vascular disease, unspecified: Secondary | ICD-10-CM | POA: Diagnosis present

## 2016-06-15 LAB — CREATININE, SERUM
Creatinine, Ser: 0.75 mg/dL (ref 0.44–1.00)
GFR calc Af Amer: >60 mL/min (ref >60)
GFR calc non Af Amer: >60 mL/min (ref >60)

## 2016-06-15 LAB — BUN: BUN: 13 mg/dL (ref 6–20)

## 2016-06-18 ENCOUNTER — Encounter: Payer: Self-pay | Admitting: *Deleted

## 2016-06-18 ENCOUNTER — Encounter
Admission: RE | Disposition: A | Discharge status: Home / Self Care | Payer: Self-pay | Source: Ambulatory Visit | Attending: Vascular Surgery

## 2016-06-18 ENCOUNTER — Ambulatory Visit
Admission: RE | Admit: 2016-06-18 | Discharge: 2016-06-18 | Disposition: A | Discharge status: Home / Self Care | Payer: Medicare Other | Source: Ambulatory Visit | Attending: Vascular Surgery | Admitting: Vascular Surgery

## 2016-06-18 DIAGNOSIS — Z6825 Body mass index (BMI) 25.0-25.9, adult: Secondary | ICD-10-CM | POA: Insufficient documentation

## 2016-06-18 DIAGNOSIS — I7025 Atherosclerosis of native arteries of other extremities with ulceration: Secondary | ICD-10-CM | POA: Diagnosis not present

## 2016-06-18 DIAGNOSIS — Z7902 Long term (current) use of antithrombotics/antiplatelets: Secondary | ICD-10-CM | POA: Insufficient documentation

## 2016-06-18 DIAGNOSIS — Z96649 Presence of unspecified artificial hip joint: Secondary | ICD-10-CM | POA: Insufficient documentation

## 2016-06-18 DIAGNOSIS — Z881 Allergy status to other antibiotic agents status: Secondary | ICD-10-CM | POA: Insufficient documentation

## 2016-06-18 DIAGNOSIS — Z880 Allergy status to penicillin: Secondary | ICD-10-CM | POA: Diagnosis not present

## 2016-06-18 DIAGNOSIS — I999 Unspecified disorder of circulatory system: Secondary | ICD-10-CM | POA: Diagnosis not present

## 2016-06-18 DIAGNOSIS — L97512 Non-pressure chronic ulcer of other part of right foot with fat layer exposed: Secondary | ICD-10-CM | POA: Insufficient documentation

## 2016-06-18 DIAGNOSIS — Z888 Allergy status to other drugs, medicaments and biological substances status: Secondary | ICD-10-CM | POA: Diagnosis not present

## 2016-06-18 DIAGNOSIS — Z809 Family history of malignant neoplasm, unspecified: Secondary | ICD-10-CM | POA: Diagnosis not present

## 2016-06-18 DIAGNOSIS — Z95810 Presence of automatic (implantable) cardiac defibrillator: Secondary | ICD-10-CM | POA: Diagnosis not present

## 2016-06-18 DIAGNOSIS — C801 Malignant (primary) neoplasm, unspecified: Secondary | ICD-10-CM | POA: Diagnosis not present

## 2016-06-18 DIAGNOSIS — F0390 Unspecified dementia without behavioral disturbance: Secondary | ICD-10-CM | POA: Insufficient documentation

## 2016-06-18 DIAGNOSIS — I509 Heart failure, unspecified: Secondary | ICD-10-CM | POA: Diagnosis not present

## 2016-06-18 DIAGNOSIS — Z882 Allergy status to sulfonamides status: Secondary | ICD-10-CM | POA: Diagnosis not present

## 2016-06-18 DIAGNOSIS — I499 Cardiac arrhythmia, unspecified: Secondary | ICD-10-CM | POA: Diagnosis not present

## 2016-06-18 HISTORY — PX: PERIPHERAL VASCULAR CATHETERIZATION: SHX172C

## 2016-06-18 SURGERY — LOWER EXTREMITY ANGIOGRAPHY
Anesthesia: Moderate Sedation | Laterality: Right

## 2016-06-18 MED ORDER — SODIUM CHLORIDE 0.9 % IV SOLN
500.0000 mL | Freq: Once | INTRAVENOUS | Status: DC | PRN
Start: 2016-06-18 — End: 2016-06-18

## 2016-06-18 MED ORDER — OXYCODONE-ACETAMINOPHEN 5-325 MG PO TABS: 1.0000 | ORAL_TABLET | ORAL | Status: DC | PRN

## 2016-06-18 MED ORDER — ACETAMINOPHEN 325 MG RE SUPP
325.0000 mg | RECTAL | Status: DC | PRN
  Filled 2016-06-18: qty 2

## 2016-06-18 MED ORDER — ACETAMINOPHEN 325 MG PO TABS: 325.0000 mg | ORAL_TABLET | ORAL | Status: DC | PRN

## 2016-06-18 MED ORDER — FENTANYL CITRATE (PF) 100 MCG/2ML IJ SOLN
INTRAMUSCULAR | Status: AC
  Filled 2016-06-18: qty 2

## 2016-06-18 MED ORDER — ASPIRIN EC 81 MG PO TBEC: 81.0000 mg | DELAYED_RELEASE_TABLET | Freq: Every day | ORAL | 2 refills | Status: DC

## 2016-06-18 MED ORDER — ONDANSETRON HCL 4 MG/2ML IJ SOLN: 4.0000 mg | Freq: Four times a day (QID) | INTRAMUSCULAR | Status: DC | PRN

## 2016-06-18 MED ORDER — CLINDAMYCIN PHOSPHATE 300 MG/50ML IV SOLN: 300.0000 mg | Freq: Once | INTRAVENOUS | Status: DC

## 2016-06-18 MED ORDER — HYDROMORPHONE HCL 1 MG/ML IJ SOLN: 0.5000 mg | INTRAMUSCULAR | Status: DC | PRN

## 2016-06-18 MED ORDER — LABETALOL HCL 5 MG/ML IV SOLN: 10.0000 mg | INTRAVENOUS | Status: DC | PRN

## 2016-06-18 MED ORDER — GUAIFENESIN-DM 100-10 MG/5ML PO SYRP: 15.0000 mL | ORAL_SOLUTION | ORAL | Status: DC | PRN

## 2016-06-18 MED ORDER — HYDROMORPHONE HCL 1 MG/ML IJ SOLN
INTRAMUSCULAR | Status: AC
  Filled 2016-06-18: qty 1

## 2016-06-18 MED ORDER — HYDRALAZINE HCL 20 MG/ML IJ SOLN: 5.0000 mg | INTRAMUSCULAR | Status: DC | PRN

## 2016-06-18 MED ORDER — MIDAZOLAM HCL 2 MG/2ML IJ SOLN
INTRAMUSCULAR | Status: DC | PRN
  Administered 2016-06-18: 2 mg via INTRAVENOUS
  Administered 2016-06-18: 1 mg via INTRAVENOUS

## 2016-06-18 MED ORDER — SODIUM CHLORIDE 0.9 % IV SOLN
INTRAVENOUS | Status: DC
  Administered 2016-06-18: 11:00:00 via INTRAVENOUS

## 2016-06-18 MED ORDER — MIDAZOLAM HCL 5 MG/5ML IJ SOLN
INTRAMUSCULAR | Status: AC
  Filled 2016-06-18: qty 5

## 2016-06-18 MED ORDER — PHENOL 1.4 % MT LIQD
1.0000 | OROMUCOSAL | Status: DC | PRN
  Filled 2016-06-18: qty 177

## 2016-06-18 MED ORDER — METHYLPREDNISOLONE SODIUM SUCC 125 MG IJ SOLR: 125.0000 mg | INTRAMUSCULAR | Status: DC | PRN

## 2016-06-18 MED ORDER — HYDROMORPHONE HCL 1 MG/ML IJ SOLN
1.0000 mg | Freq: Once | INTRAMUSCULAR | Status: AC
  Administered 2016-06-18: 1 mg via INTRAVENOUS

## 2016-06-18 MED ORDER — IOPAMIDOL (ISOVUE-300) INJECTION 61%
INTRAVENOUS | Status: DC | PRN
  Administered 2016-06-18: 85 mL via INTRAVENOUS

## 2016-06-18 MED ORDER — CLINDAMYCIN PHOSPHATE 300 MG/50ML IV SOLN
INTRAVENOUS | Status: AC
  Filled 2016-06-18: qty 50

## 2016-06-18 MED ORDER — DIPHENHYDRAMINE HCL 50 MG/ML IJ SOLN
INTRAMUSCULAR | Status: AC
  Filled 2016-06-18: qty 1

## 2016-06-18 MED ORDER — METOPROLOL TARTRATE 5 MG/5ML IV SOLN: 2.0000 mg | INTRAVENOUS | Status: DC | PRN

## 2016-06-18 MED ORDER — LIDOCAINE-EPINEPHRINE (PF) 1 %-1:200000 IJ SOLN
INTRAMUSCULAR | Status: AC
  Filled 2016-06-18: qty 30

## 2016-06-18 MED ORDER — HEPARIN (PORCINE) IN NACL 2-0.9 UNIT/ML-% IJ SOLN
INTRAMUSCULAR | Status: AC
  Filled 2016-06-18: qty 1000

## 2016-06-18 MED ORDER — HEPARIN SODIUM (PORCINE) 1000 UNIT/ML IJ SOLN
INTRAMUSCULAR | Status: DC | PRN
  Administered 2016-06-18: 5000 [IU] via INTRAVENOUS

## 2016-06-18 MED ORDER — HEPARIN SODIUM (PORCINE) 1000 UNIT/ML IJ SOLN
INTRAMUSCULAR | Status: AC
  Filled 2016-06-18: qty 1

## 2016-06-18 MED ORDER — FAMOTIDINE 20 MG PO TABS: 40.0000 mg | ORAL_TABLET | ORAL | Status: DC | PRN

## 2016-06-18 MED ORDER — ATORVASTATIN CALCIUM 20 MG PO TABS: 20.0000 mg | ORAL_TABLET | Freq: Every day | ORAL | 5 refills | Status: DC

## 2016-06-18 MED ORDER — DIPHENHYDRAMINE HCL 50 MG/ML IJ SOLN
INTRAMUSCULAR | Status: DC | PRN
  Administered 2016-06-18: 50 mg via INTRAVENOUS

## 2016-06-18 MED ORDER — FENTANYL CITRATE (PF) 100 MCG/2ML IJ SOLN
INTRAMUSCULAR | Status: DC | PRN
  Administered 2016-06-18: 50 ug via INTRAVENOUS
  Administered 2016-06-18: 25 ug via INTRAVENOUS

## 2016-06-18 SURGICAL SUPPLY — 24 items
BALLN LUTONIX 5X120X130 (BALLOONS) ×4
BALLN LUTONIX DCB 7X60X130 (BALLOONS) ×4
BALLN ULTRVRSE 2X150X150 (BALLOONS) ×2
BALLN ULTRVRSE 2X150X150 OTW (BALLOONS) ×2
BALLN ULTRVRSE 3X300X150 (BALLOONS) ×2
BALLN ULTRVRSE 3X300X150 OTW (BALLOONS) ×2
BALLOON LUTONIX 5X120X130 (BALLOONS) ×2 IMPLANT
BALLOON LUTONIX DCB 7X60X130 (BALLOONS) ×2 IMPLANT
BALLOON ULTRVRSE 2X150X150 OTW (BALLOONS) ×2 IMPLANT
BALLOON ULTRVRSE 3X300X150 OTW (BALLOONS) ×2 IMPLANT
CATH CXI SUPP ANG 4FR 135 (MICROCATHETER) ×2 IMPLANT
CATH CXI SUPP ANG 4FR 135CM (MICROCATHETER) ×4
CATH PIG 70CM (CATHETERS) ×4 IMPLANT
CATH VERT 100CM (CATHETERS) ×4 IMPLANT
DEVICE PRESTO INFLATION (MISCELLANEOUS) ×4 IMPLANT
DEVICE STARCLOSE SE CLOSURE (Vascular Products) ×4 IMPLANT
GLIDEWIRE ADV .035X260CM (WIRE) ×8 IMPLANT
GUIDEWIRE PFTE-COATED .018X300 (WIRE) ×4 IMPLANT
PACK ANGIOGRAPHY (CUSTOM PROCEDURE TRAY) ×4 IMPLANT
SHEATH ANL2 6FRX45 HC (SHEATH) ×4 IMPLANT
SHEATH BRITE TIP 5FRX11 (SHEATH) ×4 IMPLANT
SYR MEDRAD MARK V 150ML (SYRINGE) ×4 IMPLANT
TUBING CONTRAST HIGH PRESS 72 (TUBING) ×4 IMPLANT
WIRE J 3MM .035X145CM (WIRE) ×4 IMPLANT

## 2016-06-18 NOTE — Op Note (Signed)
[facility] Percutaneous Study/Intervention Procedural Note   Date of Surgery: 06/18/2016  Surgeon(s):Tanaia Hawkey   Assistants:none  Pre-operative Diagnosis: PAD with ulceration right lower extremity  Post-operative diagnosis: Same  Procedure(s) Performed: 1. Ultrasound guidance for vascular access left femoral artery 2. Catheter placement into right anterior tibial artery and right posterior tibial artery from left femoral approach 3. Aortogram and selective right lower extremity angiogram 4. Percutaneous transluminal angioplasty of right posterior tibial artery with 2 mm diameter angioplasty balloon distally and 3 mm diameter angioplasty balloon from its origin to just above the ankle 5. Percutaneous transluminal angioplasty of the right anterior tibial artery with 2 mm diameter angioplasty balloon in the dorsalis pedis artery and foot and 3 mm diameter angioplasty balloon from the ankle to the origin  6.  Percutaneous transluminal angioplasty of the distal SFA and above-knee popliteal artery with 5 mm diameter by 12 cm Lutonix drug-coated angioplasty balloon  7.  Percutaneous transluminal angioplasty of the right external iliac artery and proximal common femoral artery with 2 inflations a 7 mm diameter by 6 cm length Lutonix drug-coated angioplasty balloon 8. StarClose closure device left femoral artery  EBL: 25 cc  Contrast: 85 cc  Fluoro Time: 9.7 minutes  Moderate Conscious Sedation Time: approximately 45 minutes using 4 mg of Versed and 100 mcg of Fentanyl  Indications: Patient is a 81 y.o.female with nonhealing ulcers on the right foot. The patient is brought in for angiography for further evaluation and potential treatment. Risks and benefits are discussed and informed consent is obtained  Procedure: The patient was identified and appropriate procedural time  out was performed. The patient was then placed supine on the table and prepped and draped in the usual sterile fashion.Moderate conscious sedation was administered during a face to face encounter with the patient throughout the procedure with my supervision of the RN administering medicines and monitoring the patient's vital signs, pulse oximetry, telemetry and mental status throughout from the start of the procedure until the patient was taken to the recovery room. Ultrasound was used to evaluate the left common femoral artery. It was patent . A digital ultrasound image was acquired. A Seldinger needle was used to access the left common femoral artery under direct ultrasound guidance and a permanent image was performed. A 0.035 J wire was advanced without resistance and a 5Fr sheath was placed. Pigtail catheter was placed into the aorta and an AP aortogram was performed.  Renal arteries and aorta were patent and normal appearing. Right iliac artery was markedly tortuous and had about a 50% stenosis at the origin of the external iliac artery and then a large calcific plaque and what appeared to be the distal external iliac artery and proximal common femoral artery that had about a 70% stenosis. The left external iliac artery had subtle wall irregularities and some tortuosity that could have been fibromuscular dysplasia although wire bias could have also been present. . I then crossed the aortic bifurcation and advanced to the right femoral head. Selective right lower extremity angiogram was then performed. This demonstrated 60-70% stenosis in 2 areas in the above-knee popliteal artery just below Hunter's canal in close proximity. There was then a continuous peroneal artery which terminated at the ankle is typical. The anterior tibial and posterior tibial arteries both had long segment occlusions but reconstituted distally. The patient was systemically heparinized and a 6 [place] Ansell sheath was then placed  over the [employer] wire. I then used a Kumpe catheter and  the advantage wire to navigate into the tibioperoneal trunk. I then went into the posterior tibial artery and was able to easily cross the occlusion with a CXI catheter and a 0.018 advantage wire and confirm intraluminal flow in the posterior tibial artery in the foot. A 2 mm diameter by 10 cm length angioplasty balloon was inflated in the foot and just above the ankle posterior tibial artery with a 3 mm diameter by 30 cm length angioplasty balloon used for the remainder of the posterior tibial artery. These inflations were both to about 8-10 atm for 1 minute. Completion angiogram showed no greater than 20% residual stenosis with brisk flow in the posterior tibial artery to the foot. I then turned my attention to the anterior tibial artery. With the CXI catheter and the 0.018 advantage wire was able to get into the anterior tibial artery, and easily cross the occlusion and confirm intraluminal flow in the foot and the dorsalis pedis artery. The wire was replaced. Again, the 2 mm diameter angioplasty balloon was used in the foot and ankle area and a 3 mm diameter by 30 cm length angioplasty balloon was taken from just above the ankle to the origin of the anterior tibial artery. The 3 balloon was inflated to 8 atm with a 2 balloon inflated to 12 atm. Completion angiogram showed no greater than 20% residual stenosis in the anterior tibial artery and now there was three-vessel runoff. A 5 mm diameter by 12 cm length Lutonix drug-coated angioplasty balloon was used to treat the popliteal stenosis. This was inflated to 10 atm for 1 minute with less than 10% residual stenosis remaining after angioplasty. For the distal external iliac artery and common femoral lesion I used a 7 mm diameter by 6 cm length Lutonix drug-coated angioplasty balloon. This was inflated to 10 atm for 1 minute. This balloon was pulled back to the proximal external iliac artery to treat  the borderline stenosis there with only a mild waist seen. This was inflated to 8 atm for 1 minute. Completion angiogram showed no residual stenosis in the proximal external iliac artery with only about a 20-25% residual stenosis at the distal external iliac artery that did not appear flow limiting. I elected to terminate the procedure. The sheath was removed and StarClose closure device was deployed in the left femoral artery with excellent hemostatic result. The patient was taken to the recovery room in stable condition having tolerated the procedure well.  Findings:  Aortogram: Renal arteries and aorta were patent and normal appearing. Right iliac artery was markedly tortuous and had about a 50% stenosis at the origin of the external iliac artery and then a large calcific plaque and what appeared to be the distal external iliac artery and proximal common femoral artery that had about a 70% stenosis. The left external iliac artery had subtle wall irregularities and some tortuosity that could have been fibromuscular dysplasia although wire bias could have also been present. Right Lower Extremity: This demonstrated 60-70% stenosis in 2 areas in the above-knee popliteal artery just below Hunter's canal in close proximity. There was then a continuous peroneal artery which terminated at the ankle is typical. The anterior tibial and posterior tibial arteries both had long segment occlusions but reconstituted distally.   Disposition: Patient was taken to the recovery room in stable condition having tolerated the procedure well.  Complications: None  Mindy Cortez 06/18/2016 12:28 PM

## 2016-06-18 NOTE — H&P (Signed)
  [facility] History & Physical Update  The patient was interviewed and re-examined.  The patient's previous History and Physical has been reviewed and is unchanged.  There is no change in the plan of care. We plan to proceed with the scheduled procedure.  Anjelina Dung, MD  06/18/2016, 10:32 AM

## 2016-06-18 NOTE — OR Nursing (Signed)
Pubis irregular, Mindy Cortez from vascular reports it is due to prior surgeries and it is unchanged. Left groin site soft and no hematoma noted

## 2016-06-18 NOTE — Discharge Instructions (Signed)
Angiogram, Care After °Refer to this sheet in the next few weeks. These instructions provide you with information about caring for yourself after your procedure. Your health care provider may also give you more specific instructions. Your treatment has been planned according to current medical practices, but problems sometimes occur. Call your health care provider if you have any problems or questions after your procedure. °WHAT TO EXPECT AFTER THE PROCEDURE °After your procedure, it is typical to have the following: °· Bruising at the catheter insertion site that usually fades within 1-2 weeks. °· Blood collecting in the tissue (hematoma) that may be painful to the touch. It should usually decrease in size and tenderness within 1-2 weeks. °HOME CARE INSTRUCTIONS °· Take medicines only as directed by your health care provider. °· You may shower 24-48 hours after the procedure or as directed by your health care provider. Remove the bandage (dressing) and gently wash the site with plain soap and water. Pat the area dry with a clean towel. Do not rub the site, because this may cause bleeding. °· Do not take baths, swim, or use a hot tub until your health care provider approves. °· Check your insertion site every day for redness, swelling, or drainage. °· Do not apply powder or lotion to the site. °· Do not lift over 10 lb (4.5 kg) for 5 days after your procedure or as directed by your health care provider. °· Ask your health care provider when it is okay to: °¨ Return to work or school. °¨ Resume usual physical activities or sports. °¨ Resume sexual activity. °· Do not drive home if you are discharged the same day as the procedure. Have someone else drive you. °· You may drive 24 hours after the procedure unless otherwise instructed by your health care provider. °· Do not operate machinery or power tools for 24 hours after the procedure or as directed by your health care provider. °· If your procedure was done as an  outpatient procedure, which means that you went home the same day as your procedure, a responsible adult should be with you for the first 24 hours after you arrive home. °· Keep all follow-up visits as directed by your health care provider. This is important. °SEEK MEDICAL CARE IF: °· You have a fever. °· You have chills. °· You have increased bleeding from the catheter insertion site. Hold pressure on the site. °SEEK IMMEDIATE MEDICAL CARE IF: °· You have unusual pain at the catheter insertion site. °· You have redness, warmth, or swelling at the catheter insertion site. °· You have drainage (other than a small amount of blood on the dressing) from the catheter insertion site. °· The catheter insertion site is bleeding, and the bleeding does not stop after 30 minutes of holding steady pressure on the site. °· The area near or just beyond the catheter insertion site becomes pale, cool, tingly, or numb. °  °This information is not intended to replace advice given to you by your health care provider. Make sure you discuss any questions you have with your health care provider. °  °Document Released: 04/26/2005 Document Revised: 10/29/2014 Document Reviewed: 03/11/2013 °Elsevier Interactive Patient Education ©2016 [employer] ° °

## 2016-06-19 ENCOUNTER — Encounter: Payer: Self-pay | Admitting: Vascular Surgery

## 2016-09-21 ENCOUNTER — Encounter: Payer: Self-pay | Admitting: *Deleted

## 2016-10-17 ENCOUNTER — Encounter (INDEPENDENT_AMBULATORY_CARE_PROVIDER_SITE_OTHER): Payer: Self-pay

## 2016-10-17 ENCOUNTER — Ambulatory Visit (INDEPENDENT_AMBULATORY_CARE_PROVIDER_SITE_OTHER): Payer: Self-pay | Admitting: Vascular Surgery

## 2016-12-11 ENCOUNTER — Ambulatory Visit (INDEPENDENT_AMBULATORY_CARE_PROVIDER_SITE_OTHER): Payer: Medicare Other | Admitting: Vascular Surgery

## 2016-12-11 ENCOUNTER — Encounter (INDEPENDENT_AMBULATORY_CARE_PROVIDER_SITE_OTHER): Payer: Self-pay | Admitting: Vascular Surgery

## 2016-12-11 VITALS — BP 114/59 | HR 76 | Resp 16 | Wt 153.0 lb

## 2016-12-11 DIAGNOSIS — C8590 Non-Hodgkin lymphoma, unspecified, unspecified site: Secondary | ICD-10-CM | POA: Diagnosis not present

## 2016-12-11 DIAGNOSIS — I10 Essential (primary) hypertension: Secondary | ICD-10-CM | POA: Diagnosis not present

## 2016-12-11 DIAGNOSIS — I7025 Atherosclerosis of native arteries of other extremities with ulceration: Secondary | ICD-10-CM | POA: Diagnosis not present

## 2016-12-11 DIAGNOSIS — I4891 Unspecified atrial fibrillation: Secondary | ICD-10-CM | POA: Diagnosis not present

## 2016-12-11 NOTE — Assessment & Plan Note (Signed)
Can certainly create a hypercoagulable situation. On anticoagulation.

## 2016-12-11 NOTE — Progress Notes (Signed)
Patient ID: Mindy Cortez, female   DOB: 26-Nov-1934, 82 y.o.   MRN: [id number]  Chief Complaint  Patient presents with  . Follow-up    HPI Mindy Cortez is a 82 y.o. female.  I am asked to see the patient by Boykin Reaper for evaluation of a non-healing ulcer on the patient's toe.  She Is about 6 months status post revascularization of the right lower extremity percutaneously, but she has not been back to our clinic in the last 4-5 months.  The patient reports having gone to the podiatrist yesterday who felt this was a blood blister which burst. They're not scheduled to see the podiatrist again and he felt this would heal. She does not really have a lot of pain. Her sensation is decreased in the feet. Her legs are weak and she uses a motorized wheelchair. She remains on anticoagulation.   Past Medical History:  Diagnosis Date  . Depression   . 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 HTN (hypertension)   . Hypertension   . Neuropathy ([place])   . Nocturia   . Non-Hodgkin lymphoma ([place])    Bone Marrow Transplant with chemo + Rad tx's.  . Tachycardia   . Urge incontinence   . Urinary incontinence     Past Surgical History:  Procedure Laterality Date  . BONE MARROW TRANSPLANT    . COMPLICATED TOTAL HIP PROSTHESIS AND METHYLMETHACRALATE REMOVAL W/O SPACER INSERTION    . PERIPHERAL VASCULAR CATHETERIZATION Right 06/18/2016   Procedure: Lower Extremity Angiography;  Surgeon: Algernon Huxley, MD;  Location: [facility];  Service: Cardiovascular;  Laterality: Right;  . PERIPHERAL VASCULAR CATHETERIZATION  06/18/2016   Procedure: Lower Extremity Intervention;  Surgeon: Algernon Huxley, MD;  Location: [facility];  Service: Cardiovascular;;  . TOTAL HIP ARTHROPLASTY  [id number]    Family History  Problem Relation Age of Onset  . Osteosarcoma Father   . Heart disease Father   . Heart disease Mother   . Breast cancer Mother   No  bleeding or clotting disorders  Social History Social History  Substance Use Topics  . Smoking status: Never Smoker  . Smokeless tobacco: Never Used  . Alcohol use 0.0 oz/week     Comment: rarely  No IVDU  Allergies  Allergen Reactions  . Penicillin V Potassium Hives  . Prochlorperazine Nausea And Vomiting    DIZZINESS, DRUNK FEELING. DIZZINESS, DRUNK FEELING.  . Ramipril Other (See Comments)    SEVERE HEADACHE SEVERE HEADACHE  . Rifampin     Severe stomach irriatates cramping and nausea  . Sulfa Antibiotics     TOLD AS A CHILD, MADE PT NERVOUS, EMOTIONAL, TOLD NOT TO TAKE.  . Cephalexin Rash    RASH, ITCHING. RASH, ITCHING.    Current Outpatient Prescriptions  Medication Sig Dispense Refill  . apixaban (ELIQUIS) 5 MG TABS tablet Take 5 mg by mouth 2 (two) times daily.    Marland Kitchen aspirin EC 81 MG tablet Take 1 tablet (81 mg total) by mouth daily. 150 tablet 2  . atorvastatin (LIPITOR) 20 MG tablet Take 1 tablet (20 mg total) by mouth daily. 30 tablet 5  . cholecalciferol (VITAMIN D) 1000 units tablet Take 1,000 Units by mouth daily.    . digoxin (LANOXIN) 0.125 MG tablet Take 0.125 mg by mouth daily.    Marland Kitchen donepezil (ARICEPT) 10 MG tablet Take 10 mg by mouth at bedtime.    Marland Kitchen  DULoxetine (CYMBALTA) 30 MG capsule Take 30 mg by mouth daily.    . DULoxetine (CYMBALTA) 60 MG capsule Take 60 mg by mouth daily.    . furosemide (LASIX) 20 MG tablet Take 20 mg by mouth daily as needed for edema.    Marland Kitchen guaifenesin (ROBITUSSIN) 100 MG/5ML syrup Take 200 mg by mouth every 6 (six) hours as needed for cough.    . loperamide (IMODIUM) 2 MG capsule Take 2 mg by mouth as needed for diarrhea or loose stools.    Marland Kitchen losartan (COZAAR) 25 MG tablet Take 25 mg by mouth daily.    . magnesium hydroxide (MILK OF MAGNESIA) 400 MG/5ML suspension Take 30 mLs by mouth at bedtime as needed for mild constipation.    . metoprolol (LOPRESSOR) 50 MG tablet Take 50 mg by mouth 2 (two) times daily.    . Multiple  Vitamin (MULTIVITAMIN WITH MINERALS) TABS tablet Take 1 tablet by mouth daily.    Marland Kitchen neomycin-bacitracin-polymyxin (NEOSPORIN) ointment Apply 1 application topically as needed for wound care. apply to eye    . vitamin B-12 (CYANOCOBALAMIN) 1000 MCG tablet Take 1,000 mcg by mouth daily.    Marland Kitchen alum & mag hydroxide-simeth (MINTOX) 200-200-20 MG/5ML suspension Take 30 mLs by mouth as needed for indigestion or heartburn.    . phenazopyridine (PYRIDIUM) 200 MG tablet Take 1 tablet (200 mg total) by mouth 3 (three) times daily as needed for pain. (Patient not taking: Reported on 12/11/2016) 20 tablet 0   No current facility-administered medications for this visit.       REVIEW OF SYSTEMS (Negative unless checked)  Constitutional: [] Weight loss  [] Fever  [] Chills Cardiac: [] Chest pain   [] Chest pressure   [] Palpitations   [] Shortness of breath when laying flat   [] Shortness of breath at rest   [] Shortness of breath with exertion. Vascular:  [] Pain in legs with walking   [] Pain in legs at rest   [] Pain in legs when laying flat   [] Claudication   [] Pain in feet when walking  [x] Pain in feet at rest  [] Pain in feet when laying flat   [] History of DVT   [] Phlebitis   [] Swelling in legs   [] Varicose veins   [x] Non-healing ulcers Pulmonary:   [] Uses home oxygen   [] Productive cough   [] Hemoptysis   [] Wheeze  [] COPD   [] Asthma Neurologic:  [] Dizziness  [] Blackouts   [] Seizures   [] History of stroke   [] History of TIA  [] Aphasia   [] Temporary blindness   [] Dysphagia   [] Weakness or numbness in arms   [x] Weakness or numbness in legs Musculoskeletal:  [] Arthritis   [] Joint swelling   [] Joint pain   [] Low back pain Hematologic:  [x] Easy bruising  [x] Easy bleeding   [] Hypercoagulable state   [x] Anemic  [] Hepatitis Gastrointestinal:  [] Blood in stool   [] Vomiting blood  [] Gastroesophageal reflux/heartburn   [] Abdominal pain Genitourinary:  [] Chronic kidney disease   [] Difficult urination  [] Frequent urination   [] Burning with urination   [] Hematuria Skin:  [] Rashes   [x] Ulcers   [x] Wounds Psychological:  [] History of anxiety   []  History of major depression.    Physical Exam BP (!) 114/59   Pulse 76   Resp 16   Wt 153 lb (69.4 kg)   BMI 24.32 kg/m  Gen:  WD/WN, NAD Head: [place]/AT, No temporalis wasting. Ear/Nose/Throat: Hearing grossly intact, nares w/o erythema or drainage, oropharynx w/o Erythema/Exudate Eyes: Conjunctiva clear, sclera non-icteric  Neck: trachea midline.  No JVD.  Pulmonary:  Good air  movement, no use of accessory muscles  Cardiac: RRR, normal S1, S2 Vascular:  Vessel Right Left  Radial Palpable Palpable  Ulnar Palpable Palpable  Brachial Palpable Palpable  Carotid Palpable, without bruit Palpable, without bruit  Aorta Not palpable N/A  Femoral Palpable Palpable  Popliteal 1+ Palpable 1+ Palpable  PT Trace Palpable 1+ Palpable  DP 2+ Palpable 1+ Palpable   Gastrointestinal: soft, non-tender/non-distended. Musculoskeletal: uses a motorize wheelchair. 1+ BLE edema. Neurologic: Sensation grossly intact in extremities. Speech is fluent.  Psychiatric: Judgment intact, Mood & affect appropriate for pt's clinical situation. Dermatologic: Shallow ulceration on the medial aspect of the right second toe. Recently treated by podiatry who felt this was a blood blister that burst. No evidence of erythema or purulent drainage. Lymph : No Cervical, Axillary, or Inguinal lymphadenopathy.   Radiology No results found.  Labs No results found for this or any previous visit (from the past 2160 hour(s)).  Assessment/Plan:  BP (high blood pressure) blood pressure control important in reducing the progression of atherosclerotic disease. On appropriate oral medications.   Atrial fibrillation with rapid ventricular response On anticoagulation.  Lymphoma, non-Hodgkin's Can certainly create a hypercoagulable situation. On anticoagulation.  Atherosclerosis of native arteries of  the extremities with ulceration ([place]) The patient does have a shallow ulceration on the right second toe. Although this appears reasonably clean, with a patient who had an extensive history of revascularization 6 months ago and has not been checked in many months, I think it would be prudent to recheck her noninvasive studies. She does have a palpable pulse and I think clinically he has good perfusion for wound healing. Her right ABI at last check was 0.9. I would continue her current medical regimen and obtain noninvasive studies in the near future at her convenience. We will see her back following the studies.      Leotis Pain 12/11/2016, 5:02 PM   This note was created with Dragon medical transcription system.  Any errors from dictation are unintentional.

## 2016-12-11 NOTE — Assessment & Plan Note (Signed)
On anticoagulation 

## 2016-12-11 NOTE — Assessment & Plan Note (Signed)
blood pressure control important in reducing the progression of atherosclerotic disease. On appropriate oral medications.  

## 2016-12-11 NOTE — Assessment & Plan Note (Signed)
The patient does have a shallow ulceration on the right second toe. Although this appears reasonably clean, with a patient who had an extensive history of revascularization 6 months ago and has not been checked in many months, I think it would be prudent to recheck her noninvasive studies. She does have a palpable pulse and I think clinically he has good perfusion for wound healing. Her right ABI at last check was 0.9. I would continue her current medical regimen and obtain noninvasive studies in the near future at her convenience. We will see her back following the studies.

## 2017-02-22 ENCOUNTER — Ambulatory Visit (INDEPENDENT_AMBULATORY_CARE_PROVIDER_SITE_OTHER): Payer: Medicare Other | Admitting: Vascular Surgery

## 2017-02-22 ENCOUNTER — Encounter (INDEPENDENT_AMBULATORY_CARE_PROVIDER_SITE_OTHER): Payer: Medicare Other

## 2017-09-05 ENCOUNTER — Other Ambulatory Visit: Payer: Self-pay | Admitting: Physician Assistant

## 2017-09-05 ENCOUNTER — Ambulatory Visit
Admission: RE | Admit: 2017-09-05 | Discharge: 2017-09-05 | Disposition: A | Discharge status: Home / Self Care | Payer: Medicare Other | Source: Ambulatory Visit | Attending: Physician Assistant | Admitting: Physician Assistant

## 2017-09-05 DIAGNOSIS — K5730 Diverticulosis of large intestine without perforation or abscess without bleeding: Secondary | ICD-10-CM | POA: Insufficient documentation

## 2017-09-05 DIAGNOSIS — I700 Atherosclerosis of aorta: Secondary | ICD-10-CM | POA: Diagnosis not present

## 2017-09-05 DIAGNOSIS — R1031 Right lower quadrant pain: Secondary | ICD-10-CM | POA: Insufficient documentation

## 2017-09-05 DIAGNOSIS — M8580 Other specified disorders of bone density and structure, unspecified site: Secondary | ICD-10-CM | POA: Diagnosis not present

## 2017-09-05 DIAGNOSIS — K8020 Calculus of gallbladder without cholecystitis without obstruction: Secondary | ICD-10-CM | POA: Insufficient documentation

## 2017-09-05 DIAGNOSIS — I708 Atherosclerosis of other arteries: Secondary | ICD-10-CM | POA: Diagnosis not present

## 2017-09-05 DIAGNOSIS — K439 Ventral hernia without obstruction or gangrene: Secondary | ICD-10-CM | POA: Diagnosis not present

## 2017-09-05 MED ORDER — IOPAMIDOL (ISOVUE-300) INJECTION 61%
100.0000 mL | Freq: Once | INTRAVENOUS | Status: AC | PRN
  Administered 2017-09-05: 100 mL via INTRAVENOUS

## 2017-10-10 ENCOUNTER — Emergency Department: Payer: Medicare Other

## 2017-10-10 ENCOUNTER — Other Ambulatory Visit: Payer: Self-pay

## 2017-10-10 ENCOUNTER — Inpatient Hospital Stay
Admission: EM | Admit: 2017-10-10 | Discharge: 2017-10-12 | DRG: 871 | Disposition: A | Discharge status: Nursing Home (Includes ICF) | Payer: Medicare Other | Attending: Internal Medicine | Admitting: Internal Medicine

## 2017-10-10 DIAGNOSIS — Z8249 Family history of ischemic heart disease and other diseases of the circulatory system: Secondary | ICD-10-CM

## 2017-10-10 DIAGNOSIS — R6520 Severe sepsis without septic shock: Secondary | ICD-10-CM | POA: Diagnosis present

## 2017-10-10 DIAGNOSIS — E876 Hypokalemia: Secondary | ICD-10-CM | POA: Diagnosis present

## 2017-10-10 DIAGNOSIS — G9341 Metabolic encephalopathy: Secondary | ICD-10-CM | POA: Diagnosis present

## 2017-10-10 DIAGNOSIS — R4189 Other symptoms and signs involving cognitive functions and awareness: Secondary | ICD-10-CM | POA: Diagnosis present

## 2017-10-10 DIAGNOSIS — R351 Nocturia: Secondary | ICD-10-CM | POA: Diagnosis present

## 2017-10-10 DIAGNOSIS — Z7982 Long term (current) use of aspirin: Secondary | ICD-10-CM

## 2017-10-10 DIAGNOSIS — E871 Hypo-osmolality and hyponatremia: Secondary | ICD-10-CM | POA: Diagnosis present

## 2017-10-10 DIAGNOSIS — E860 Dehydration: Secondary | ICD-10-CM | POA: Diagnosis present

## 2017-10-10 DIAGNOSIS — G8929 Other chronic pain: Secondary | ICD-10-CM | POA: Diagnosis present

## 2017-10-10 DIAGNOSIS — M79643 Pain in unspecified hand: Secondary | ICD-10-CM

## 2017-10-10 DIAGNOSIS — R4182 Altered mental status, unspecified: Secondary | ICD-10-CM | POA: Diagnosis not present

## 2017-10-10 DIAGNOSIS — S92901A Unspecified fracture of right foot, initial encounter for closed fracture: Secondary | ICD-10-CM

## 2017-10-10 DIAGNOSIS — F0390 Unspecified dementia without behavioral disturbance: Secondary | ICD-10-CM | POA: Diagnosis present

## 2017-10-10 DIAGNOSIS — F329 Major depressive disorder, single episode, unspecified: Secondary | ICD-10-CM | POA: Diagnosis present

## 2017-10-10 DIAGNOSIS — G629 Polyneuropathy, unspecified: Secondary | ICD-10-CM | POA: Diagnosis present

## 2017-10-10 DIAGNOSIS — Z96649 Presence of unspecified artificial hip joint: Secondary | ICD-10-CM | POA: Diagnosis present

## 2017-10-10 DIAGNOSIS — Z79899 Other long term (current) drug therapy: Secondary | ICD-10-CM

## 2017-10-10 DIAGNOSIS — E872 Acidosis: Secondary | ICD-10-CM | POA: Diagnosis present

## 2017-10-10 DIAGNOSIS — Z808 Family history of malignant neoplasm of other organs or systems: Secondary | ICD-10-CM

## 2017-10-10 DIAGNOSIS — Z87440 Personal history of urinary (tract) infections: Secondary | ICD-10-CM | POA: Diagnosis not present

## 2017-10-10 DIAGNOSIS — Z881 Allergy status to other antibiotic agents status: Secondary | ICD-10-CM

## 2017-10-10 DIAGNOSIS — M7989 Other specified soft tissue disorders: Secondary | ICD-10-CM

## 2017-10-10 DIAGNOSIS — I5022 Chronic systolic (congestive) heart failure: Secondary | ICD-10-CM | POA: Diagnosis present

## 2017-10-10 DIAGNOSIS — I110 Hypertensive heart disease with heart failure: Secondary | ICD-10-CM | POA: Diagnosis present

## 2017-10-10 DIAGNOSIS — Z8572 Personal history of non-Hodgkin lymphomas: Secondary | ICD-10-CM | POA: Diagnosis not present

## 2017-10-10 DIAGNOSIS — A419 Sepsis, unspecified organism: Principal | ICD-10-CM

## 2017-10-10 DIAGNOSIS — Z803 Family history of malignant neoplasm of breast: Secondary | ICD-10-CM

## 2017-10-10 DIAGNOSIS — Z882 Allergy status to sulfonamides status: Secondary | ICD-10-CM

## 2017-10-10 DIAGNOSIS — I480 Paroxysmal atrial fibrillation: Secondary | ICD-10-CM | POA: Diagnosis present

## 2017-10-10 DIAGNOSIS — Z7901 Long term (current) use of anticoagulants: Secondary | ICD-10-CM

## 2017-10-10 DIAGNOSIS — Z880 Allergy status to penicillin: Secondary | ICD-10-CM

## 2017-10-10 DIAGNOSIS — Z888 Allergy status to other drugs, medicaments and biological substances status: Secondary | ICD-10-CM

## 2017-10-10 LAB — URINALYSIS, COMPLETE (UACMP) WITH MICROSCOPIC
Bacteria, UA: NONE SEEN
Bilirubin Urine: NEGATIVE
GLUCOSE, UA: NEGATIVE mg/dL
HGB URINE DIPSTICK: NEGATIVE
Ketones, ur: 5 mg/dL — AB
LEUKOCYTES UA: NEGATIVE
NITRITE: NEGATIVE
PH: 5 (ref 5.0–8.0)
Protein, ur: 100 mg/dL — AB
SPECIFIC GRAVITY, URINE: 1.027 (ref 1.005–1.030)
Squamous Epithelial / LPF: NONE SEEN

## 2017-10-10 LAB — APTT
aPTT: 51 seconds — ABNORMAL HIGH (ref 24–36)
aPTT: 53 seconds — ABNORMAL HIGH (ref 24–36)

## 2017-10-10 LAB — COMPREHENSIVE METABOLIC PANEL
ALBUMIN: 3.2 g/dL — AB (ref 3.5–5.0)
ALK PHOS: 137 U/L — AB (ref 38–126)
ALT: 9 U/L — ABNORMAL LOW (ref 14–54)
ANION GAP: 14 (ref 5–15)
AST: 24 U/L (ref 15–41)
BILIRUBIN TOTAL: 1.1 mg/dL (ref 0.3–1.2)
BUN: 20 mg/dL (ref 6–20)
CALCIUM: 9 mg/dL (ref 8.9–10.3)
CO2: 21 mmol/L — ABNORMAL LOW (ref 22–32)
Chloride: 99 mmol/L — ABNORMAL LOW (ref 101–111)
Creatinine, Ser: 1.07 mg/dL — ABNORMAL HIGH (ref 0.44–1.00)
GFR calc Af Amer: 55 mL/min — ABNORMAL LOW (ref >60)
GFR, EST NON AFRICAN AMERICAN: 48 mL/min — AB (ref >60)
GLUCOSE: 125 mg/dL — AB (ref 65–99)
Potassium: 3.9 mmol/L (ref 3.5–5.1)
Sodium: 134 mmol/L — ABNORMAL LOW (ref 135–145)
TOTAL PROTEIN: 8 g/dL (ref 6.5–8.1)

## 2017-10-10 LAB — CBC WITH DIFFERENTIAL/PLATELET
BASOS ABS: 0.1 10*3/uL (ref 0–0.1)
BASOS PCT: 0 %
EOS PCT: 0 %
Eosinophils Absolute: 0 10*3/uL (ref 0–0.7)
HCT: 35.3 % (ref 35.0–47.0)
Hemoglobin: 11.4 g/dL — ABNORMAL LOW (ref 12.0–16.0)
Lymphocytes Relative: 2 %
Lymphs Abs: 0.3 10*3/uL — ABNORMAL LOW (ref 1.0–3.6)
MCH: 27.8 pg (ref 26.0–34.0)
MCHC: 32.4 g/dL (ref 32.0–36.0)
MCV: 85.8 fL (ref 80.0–100.0)
MONO ABS: 1.3 10*3/uL — AB (ref 0.2–0.9)
Monocytes Relative: 6 %
Neutro Abs: 19.7 10*3/uL — ABNORMAL HIGH (ref 1.4–6.5)
Neutrophils Relative %: 92 %
PLATELETS: 292 10*3/uL (ref 150–440)
RBC: 4.12 MIL/uL (ref 3.80–5.20)
RDW: 15.2 % — AB (ref 11.5–14.5)
WBC: 21.4 10*3/uL — AB (ref 3.6–11.0)

## 2017-10-10 LAB — INFLUENZA PANEL BY PCR (TYPE A & B)
Influenza A By PCR: NEGATIVE
Influenza B By PCR: NEGATIVE

## 2017-10-10 LAB — PROTIME-INR
INR: 2.11
INR: 2.25
PROTHROMBIN TIME: 23.5 s — AB (ref 11.4–15.2)
PROTHROMBIN TIME: 24.7 s — AB (ref 11.4–15.2)

## 2017-10-10 LAB — MRSA PCR SCREENING: MRSA by PCR: POSITIVE — AB

## 2017-10-10 LAB — TROPONIN I

## 2017-10-10 LAB — LACTIC ACID, PLASMA
Lactic Acid, Venous: 2.9 mmol/L (ref 0.5–1.9)
Lactic Acid, Venous: 2.2 mmol/L (ref 0.5–1.9)

## 2017-10-10 LAB — PROCALCITONIN: PROCALCITONIN: 5.4 ng/mL

## 2017-10-10 MED ORDER — ACETAMINOPHEN 650 MG RE SUPP: 650.0000 mg | Freq: Four times a day (QID) | RECTAL | Status: DC | PRN

## 2017-10-10 MED ORDER — DONEPEZIL HCL 5 MG PO TABS
10.0000 mg | ORAL_TABLET | Freq: Every day | ORAL | Status: DC
  Administered 2017-10-10 – 2017-10-11 (×2): 10 mg via ORAL
  Filled 2017-10-10 (×3): qty 2

## 2017-10-10 MED ORDER — LEVOFLOXACIN IN D5W 750 MG/150ML IV SOLN: 750.0000 mg | INTRAVENOUS | Status: DC

## 2017-10-10 MED ORDER — ONDANSETRON HCL 4 MG/2ML IJ SOLN: 4.0000 mg | Freq: Four times a day (QID) | INTRAMUSCULAR | Status: DC | PRN

## 2017-10-10 MED ORDER — HYDROCODONE-ACETAMINOPHEN 5-325 MG PO TABS
1.0000 | ORAL_TABLET | ORAL | Status: DC | PRN
  Administered 2017-10-11 (×2): 1 via ORAL
  Administered 2017-10-11: 2 via ORAL
  Administered 2017-10-11: 1 via ORAL
  Administered 2017-10-12: 2 via ORAL
  Filled 2017-10-10 (×5): qty 1
  Filled 2017-10-10: qty 2

## 2017-10-10 MED ORDER — DULOXETINE HCL 30 MG PO CPEP
30.0000 mg | ORAL_CAPSULE | Freq: Every day | ORAL | Status: DC
  Administered 2017-10-11 – 2017-10-12 (×2): 30 mg via ORAL
  Filled 2017-10-10 (×2): qty 1

## 2017-10-10 MED ORDER — DIGOXIN 125 MCG PO TABS
0.1250 mg | ORAL_TABLET | Freq: Every day | ORAL | Status: DC
  Administered 2017-10-11 – 2017-10-12 (×2): 0.125 mg via ORAL
  Filled 2017-10-10 (×2): qty 1

## 2017-10-10 MED ORDER — LEVOFLOXACIN IN D5W 750 MG/150ML IV SOLN
750.0000 mg | Freq: Once | INTRAVENOUS | Status: AC
  Administered 2017-10-10: 750 mg via INTRAVENOUS
  Filled 2017-10-10: qty 150

## 2017-10-10 MED ORDER — CHLORHEXIDINE GLUCONATE CLOTH 2 % EX PADS
6.0000 | MEDICATED_PAD | Freq: Every day | CUTANEOUS | Status: DC
  Administered 2017-10-11: 6 via TOPICAL

## 2017-10-10 MED ORDER — DEXTROSE 5 % IV SOLN
2.0000 g | Freq: Three times a day (TID) | INTRAVENOUS | Status: DC
  Administered 2017-10-11 – 2017-10-12 (×4): 2 g via INTRAVENOUS
  Filled 2017-10-10 (×6): qty 2

## 2017-10-10 MED ORDER — VANCOMYCIN HCL IN DEXTROSE 1-5 GM/200ML-% IV SOLN
1000.0000 mg | Freq: Once | INTRAVENOUS | Status: AC
Start: 2017-10-10 — End: 2017-10-10
  Administered 2017-10-10: 1000 mg via INTRAVENOUS
  Filled 2017-10-10: qty 200

## 2017-10-10 MED ORDER — APIXABAN 5 MG PO TABS
5.0000 mg | ORAL_TABLET | Freq: Two times a day (BID) | ORAL | Status: DC
  Administered 2017-10-10 – 2017-10-12 (×4): 5 mg via ORAL
  Filled 2017-10-10 (×4): qty 1

## 2017-10-10 MED ORDER — ENOXAPARIN SODIUM 40 MG/0.4ML ~~LOC~~ SOLN: 40.0000 mg | SUBCUTANEOUS | Status: DC

## 2017-10-10 MED ORDER — ONDANSETRON HCL 4 MG PO TABS: 4.0000 mg | ORAL_TABLET | Freq: Four times a day (QID) | ORAL | Status: DC | PRN

## 2017-10-10 MED ORDER — SODIUM CHLORIDE 0.9 % IV BOLUS (SEPSIS)
1000.0000 mL | Freq: Once | INTRAVENOUS | Status: AC
  Administered 2017-10-10: 1000 mL via INTRAVENOUS

## 2017-10-10 MED ORDER — SENNOSIDES-DOCUSATE SODIUM 8.6-50 MG PO TABS: 1.0000 | ORAL_TABLET | Freq: Every evening | ORAL | Status: DC | PRN

## 2017-10-10 MED ORDER — ACETAMINOPHEN 325 MG PO TABS: 650.0000 mg | ORAL_TABLET | Freq: Four times a day (QID) | ORAL | Status: DC | PRN

## 2017-10-10 MED ORDER — SODIUM CHLORIDE 0.9 % IV SOLN
INTRAVENOUS | Status: DC
  Administered 2017-10-10 – 2017-10-11 (×2): via INTRAVENOUS

## 2017-10-10 MED ORDER — ALBUTEROL SULFATE (2.5 MG/3ML) 0.083% IN NEBU: 2.5000 mg | INHALATION_SOLUTION | RESPIRATORY_TRACT | Status: DC | PRN

## 2017-10-10 MED ORDER — ASPIRIN EC 81 MG PO TBEC
81.0000 mg | DELAYED_RELEASE_TABLET | Freq: Every day | ORAL | Status: DC
  Administered 2017-10-11 – 2017-10-12 (×2): 81 mg via ORAL
  Filled 2017-10-10 (×2): qty 1

## 2017-10-10 MED ORDER — MUPIROCIN 2 % EX OINT
1.0000 "application " | TOPICAL_OINTMENT | Freq: Two times a day (BID) | CUTANEOUS | Status: DC
  Administered 2017-10-11 – 2017-10-12 (×3): 1 via NASAL
  Filled 2017-10-10: qty 22

## 2017-10-10 MED ORDER — LOPERAMIDE HCL 2 MG PO CAPS: 2.0000 mg | ORAL_CAPSULE | Freq: Four times a day (QID) | ORAL | Status: DC | PRN

## 2017-10-10 MED ORDER — ACETAMINOPHEN 325 MG PO TABS
650.0000 mg | ORAL_TABLET | Freq: Once | ORAL | Status: AC | PRN
  Administered 2017-10-10: 650 mg via ORAL

## 2017-10-10 MED ORDER — DEXTROSE 5 % IV SOLN
2.0000 g | Freq: Once | INTRAVENOUS | Status: AC
  Administered 2017-10-10: 2 g via INTRAVENOUS
  Filled 2017-10-10: qty 2

## 2017-10-10 MED ORDER — DULOXETINE HCL 30 MG PO CPEP
60.0000 mg | ORAL_CAPSULE | Freq: Every day | ORAL | Status: DC
  Administered 2017-10-11 – 2017-10-12 (×2): 60 mg via ORAL
  Filled 2017-10-10 (×2): qty 2

## 2017-10-10 MED ORDER — ACETAMINOPHEN 325 MG PO TABS
ORAL_TABLET | ORAL | Status: AC
  Filled 2017-10-10: qty 2

## 2017-10-10 MED ORDER — VANCOMYCIN HCL IN DEXTROSE 1-5 GM/200ML-% IV SOLN
1000.0000 mg | INTRAVENOUS | Status: DC
  Administered 2017-10-11 – 2017-10-12 (×2): 1000 mg via INTRAVENOUS
  Filled 2017-10-10 (×2): qty 200

## 2017-10-10 NOTE — ED Notes (Signed)
Per [employer] paper work pt takes eliquis. Denies blurred vision but keeps holding head. Denies falling. Family denies receiving any calls about pt falling. Family states that pt hasn't slept in 2 days and has been sitting in wheelchair x 2 days. States has been treated for UTI recently and finished antibiotics. Pt is confused but answering some questions correctly.

## 2017-10-10 NOTE — Progress Notes (Signed)
Pharmacy Antibiotic Note  Mindy Cortez is a 82 y.o. female admitted on 10/10/2017 with sepsis.  Pharmacy has been consulted for levofloxacin and aztreonam dosing.  Plan: Aztreonam 2 g IV q8h Levofloxacin 750 mg IV q48h  Height: 5\' 6"  (167.6 cm) Weight: 130 lb (59 kg) IBW/kg (Calculated) : 59.3  Temp (24hrs), Avg:99.9 F (37.7 C), Min:99.1 F (37.3 C), Max:100.9 F (38.3 C)  Recent Labs  Lab 10/10/17 1419 10/10/17 1630  WBC 21.4*  --   CREATININE 1.07*  --   LATICACIDVEN 2.9* 2.2*    Estimated Creatinine Clearance: 39.1 mL/min (A) (by C-G formula based on SCr of 1.07 mg/dL (H)).    Allergies  Allergen Reactions  . Penicillin V Potassium Hives  . Prochlorperazine Nausea And Vomiting    DIZZINESS, DRUNK FEELING. DIZZINESS, DRUNK FEELING.  . Ramipril Other (See Comments)    SEVERE HEADACHE SEVERE HEADACHE  . Rifampin     Severe stomach irriatates cramping and nausea  . Sulfa Antibiotics     TOLD AS A CHILD, MADE PT NERVOUS, EMOTIONAL, TOLD NOT TO TAKE.  . Cephalexin Rash    RASH, ITCHING. RASH, ITCHING.   Antimicrobials this admission: levofloxacin 12/20 >>  aztreonam 12/20 >>   Dose adjustments this admission:  Microbiology results:  12/20 Urine: Ordered  Thank you for allowing pharmacy to be a part of this patient's care.  Lenis Noon, PharmD, BCPS Clinical Pharmacist 10/10/2017 6:51 PM

## 2017-10-10 NOTE — ED Provider Notes (Signed)
[facility] Emergency Department Provider Note   ____________________________________________    I have reviewed the triage vital signs and the nursing notes.   HISTORY  Chief Complaint Altered Mental Status  History primarily from daughter who is caregiver.   HPI Mindy Cortez is a 82 y.o. female who presents with altered mental status.  Daughter reports over the last 3-4 days the patient has been more confused than typical.  The patient does have "some dementia ".  But is typically able to drive her wheelchair and get herself into their [facility] but has had significantly more trouble with this than his usual.  Recently treated for urinary tract infection and finished antibiotics several days ago.  Patient complains of chronic right hip pain.  Daughter does report there is been some diarrhea as well but that this happens quite frequently.  Patient denies abdominal pain or nausea or vomiting no rashes reported.   Past Medical History:  Diagnosis Date  . Depression   . 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 HTN (hypertension)   . Hypertension   . Neuropathy   . Nocturia   . Non-Hodgkin lymphoma ([place])    Bone Marrow Transplant with chemo + Rad tx's.  . Tachycardia   . Urge incontinence   . Urinary incontinence     Patient Active Problem List   Diagnosis Date Noted  . Atherosclerosis of native arteries of the extremities with ulceration ([place]) 12/11/2016  . Acquired absence of right hip 04/08/2015  . Bilateral cataracts 04/08/2015  . CCF (congestive cardiac failure) ([place]) 04/08/2015  . Clinical depression 04/08/2015  . Glaucoma 04/08/2015  . BP (high blood pressure) 04/08/2015  . Neuropathy 04/08/2015  . Insomnia, persistent 03/16/2015  . Mixed Alzheimer's and vascular dementia 03/16/2015  . Atrial fibrillation with rapid ventricular response ([place]) 02/08/2015  . B12 deficiency 08/02/2014  . Leg  pain, right 06/09/2014  . Leg numbness 06/09/2014  . 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 Cardiac failure left ([place]) 11/13/2006  . Lymphoma, non-Hodgkin's ([place]) 11/13/2006  . Infection and inflammatory reaction due to internal prosthetic device, implant, and graft 11/13/2006  . Infection of breast implant ([place]) 11/13/2006  . Lymphoma of extranodal and solid organ sites ([place]) 11/13/2006  . Cardiomyopathy ([place]) 11/13/2006    Past Surgical History:  Procedure Laterality Date  . BONE MARROW TRANSPLANT    . COMPLICATED TOTAL HIP PROSTHESIS AND METHYLMETHACRALATE REMOVAL W/O SPACER INSERTION    . PERIPHERAL VASCULAR CATHETERIZATION Right 06/18/2016   Procedure: Lower Extremity Angiography;  Surgeon: Algernon Huxley, MD;  Location: [facility];  Service: Cardiovascular;  Laterality: Right;  . PERIPHERAL VASCULAR CATHETERIZATION  06/18/2016   Procedure: Lower Extremity Intervention;  Surgeon: Algernon Huxley, MD;  Location: [facility];  Service: Cardiovascular;;  . TOTAL HIP ARTHROPLASTY  [id number]    Prior to Admission medications   Medication Sig Start Date End Date Taking? Authorizing Provider  acetaminophen (TYLENOL) 325 MG tablet Take 650 mg by mouth every 6 (six) hours as needed.   Yes [provider]  apixaban (ELIQUIS) 5 MG TABS tablet Take 5 mg by mouth 2 (two) times daily.   Yes [provider]  aspirin EC 81 MG tablet Take 1 tablet (81 mg total) by mouth daily. 06/18/16  Yes Algernon Huxley, MD  cholecalciferol (VITAMIN D) 1000 units tablet Take 1,000 Units by mouth daily.   Yes [provider]  digoxin (LANOXIN) 0.125 MG tablet Take 0.125 mg by mouth daily.   Yes [provider]  donepezil (ARICEPT) 10 MG tablet Take 10 mg by mouth at bedtime.   Yes [provider]  DULoxetine (CYMBALTA) 30 MG capsule Take 30 mg by mouth daily.   Yes [provider]  DULoxetine (CYMBALTA) 60 MG capsule Take 60 mg by mouth daily.   Yes  [provider]  furosemide (LASIX) 20 MG tablet Take 20 mg by mouth daily as needed for edema.   Yes [provider]  loperamide (IMODIUM) 2 MG capsule Take 2 mg by mouth every 6 (six) hours as needed for diarrhea or loose stools.    Yes [provider]  losartan (COZAAR) 25 MG tablet Take 25 mg by mouth daily.   Yes [provider]  metoprolol tartrate (LOPRESSOR) 25 MG tablet Take 25 mg by mouth 2 (two) times daily.    Yes [provider]  Multiple Vitamin (MULTIVITAMIN WITH MINERALS) TABS tablet Take 1 tablet by mouth daily.   Yes [provider]  vitamin B-12 (CYANOCOBALAMIN) 1000 MCG tablet Take 1,000 mcg by mouth daily.   Yes [provider]  atorvastatin (LIPITOR) 20 MG tablet Take 1 tablet (20 mg total) by mouth daily. Patient not taking: Reported on 10/10/2017 06/18/16   Algernon Huxley, MD     Allergies Penicillin v potassium; Prochlorperazine; Ramipril; Rifampin; Sulfa antibiotics; and Cephalexin  Family History  Problem Relation Age of Onset  . Osteosarcoma Father   . Heart disease Father   . Heart disease Mother   . Breast cancer Mother     Social History Social History   Tobacco Use  . Smoking status: Never Smoker  . Smokeless tobacco: Never Used  Substance Use Topics  . Alcohol use: Yes    Alcohol/week: 0.0 oz    Comment: rarely  . Drug use: No    Review of Systems  Constitutional: Positive chills Eyes: No visual changes.  ENT: No sore throat. Cardiovascular: Denies chest pain. Respiratory: Denies shortness of breath. Gastrointestinal: No abdominal pain.  No nausea, no vomiting.   Genitourinary: Mild dysuria reported Musculoskeletal: Negative for back pain. Skin: Negative for rash. Neurological: Negative for headaches    ____________________________________________   PHYSICAL EXAM:  VITAL SIGNS: ED Triage Vitals  Enc Vitals Group     BP 10/10/17 1420 (!) 106/57     Pulse Rate 10/10/17  1420 (!) 131     Resp 10/10/17 1420 20     Temp 10/10/17 1420 (!) 100.9 F (38.3 C)     Temp Source 10/10/17 1420 Oral     SpO2 10/10/17 1420 97 %     Weight 10/10/17 1421 59 kg (130 lb)     Height 10/10/17 1421 1.676 m (5\' 6" )     Head Circumference --      Peak Flow --      Pain Score 10/10/17 1420 10     Pain Loc --      Pain Edu? --      Excl. in [place]? --     Constitutional: Alert but disoriented.. No acute distress. Pleasant and interactive Eyes: Conjunctivae are normal.   Nose: No congestion/rhinnorhea. Mouth/Throat: Mucous membranes are moist.   Neck:  Painless ROM Cardiovascular: Tachycardia regular rhythm. Grossly normal heart sounds.  Good peripheral circulation. Respiratory: Normal respiratory effort.  No retractions. Lungs CTAB. Gastrointestinal: Mild suprapubic tenderness to palpation. No distention.  No CVA tenderness. Genitourinary:  deferred Musculoskeletal: .  Warm and well perfused extremities, no erythema or fluctuance at the right hip Neurologic:  Normal speech and language. No gross focal neurologic deficits are appreciated.  Skin:  Skin is warm, dry and intact. No rash noted. Psychiatric: Mood and affect are normal. Speech and behavior are normal.  ____________________________________________   LABS (all labs ordered are listed, but only abnormal results are displayed)  Labs Reviewed  LACTIC ACID, PLASMA - Abnormal; Notable for the following components:      Result Value   Lactic Acid, Venous 2.9 (*)    All other components within normal limits  LACTIC ACID, PLASMA - Abnormal; Notable for the following components:   Lactic Acid, Venous 2.2 (*)    All other components within normal limits  COMPREHENSIVE METABOLIC PANEL - Abnormal; Notable for the following components:   Sodium 134 (*)    Chloride 99 (*)    CO2 21 (*)    Glucose, Bld 125 (*)    Creatinine, Ser 1.07 (*)    Albumin 3.2 (*)    ALT 9 (*)    Alkaline Phosphatase 137 (*)    GFR calc non  Af Amer 48 (*)    GFR calc Af Amer 55 (*)    All other components within normal limits  CBC WITH DIFFERENTIAL/PLATELET - Abnormal; Notable for the following components:   WBC 21.4 (*)    Hemoglobin 11.4 (*)    RDW 15.2 (*)    Neutro Abs 19.7 (*)    Lymphs Abs 0.3 (*)    Monocytes Absolute 1.3 (*)    All other components within normal limits  URINALYSIS, COMPLETE (UACMP) WITH MICROSCOPIC - Abnormal; Notable for the following components:   Color, Urine AMBER (*)    APPearance CLEAR (*)    Ketones, ur 5 (*)    Protein, ur 100 (*)    All other components within normal limits  PROTIME-INR - Abnormal; Notable for the following components:   Prothrombin Time 23.5 (*)    All other components within normal limits  APTT - Abnormal; Notable for the following components:   aPTT 51 (*)    All other components within normal limits  TROPONIN I  INFLUENZA PANEL BY PCR (TYPE A & B)  CBC  DIFFERENTIAL  CBG MONITORING, ED   ____________________________________________  EKG  ED ECG REPORT I, Lavonia Drafts, the attending physician, personally viewed and interpreted this ECG.  Date: 10/10/2017  Rate: 128 Rhythm: Sinus tachycardia QRS Axis left axis deviation Intervals: normal ST/T Wave abnormalities: normal   ____________________________________________  RADIOLOGY CT head unremarkable Chest x-ray unremarkable ____________________________________________   PROCEDURES  Procedure(s) performed: No  Procedures   Critical Care performed: No ____________________________________________   INITIAL IMPRESSION / ASSESSMENT AND PLAN / ED COURSE  Pertinent labs & imaging results that were available during my care of the patient were reviewed by me and considered in my medical decision making (see chart for details).  Patient presents with altered mental status fever and tachycardia.  Strong suspicion for metabolic encephalopathy secondary to infection/sepsis  Recently treated for  urinary tract infection and this is certainly the most likely cause.  We will check labs send influenza and call code sepsis  Medical records reviewed.   Labs significant for elevated white blood cell count, elevated lactic acid, flu negative.  Urinalysis is overall reassuring however given that she just finished a course of antibiotics I am still suspicious of a urinary tract infection.  Antibiotics given will admit  to the hospitalist service for further management     ____________________________________________   FINAL CLINICAL IMPRESSION(S) / ED DIAGNOSES  Final diagnoses:  Sepsis, due to unspecified organism [facility])        Note:  This document was prepared using Dragon voice recognition software and may include unintentional dictation errors.    Lavonia Drafts, MD 10/10/17 [phone]

## 2017-10-10 NOTE — ED Triage Notes (Signed)
[facility] for "not acting herself." granddaughter brought pt up to ED. Granddaughter states that her mother has been seeing pt every day and states she has been slurring her speech, c/o of R sided HA. Pt takes eliquis and ASA. Doesn't have a hip on R side per granddaughter. Pt is answering questions. Pt holding head. Wheelchair bound. Pt appears weak but equal bilaterally with grips. Face symmetrical.

## 2017-10-10 NOTE — ED Notes (Signed)
Attempted to call report

## 2017-10-10 NOTE — Progress Notes (Signed)
ANTIBIOTIC CONSULT NOTE - INITIAL  Pharmacy Consult for Vancomycin  Indication: sepsis  Allergies  Allergen Reactions  . Penicillin V Potassium Hives  . Prochlorperazine Nausea And Vomiting    DIZZINESS, DRUNK FEELING. DIZZINESS, DRUNK FEELING.  . Ramipril Other (See Comments)    SEVERE HEADACHE SEVERE HEADACHE  . Rifampin     Severe stomach irriatates cramping and nausea  . Sulfa Antibiotics     TOLD AS A CHILD, MADE PT NERVOUS, EMOTIONAL, TOLD NOT TO TAKE.  . Cephalexin Rash    RASH, ITCHING. RASH, ITCHING.    Patient Measurements: Height: 5\' 5"  (165.1 cm) Weight: 142 lb 13.7 oz (64.8 kg) IBW/kg (Calculated) : 57 Adjusted Body Weight: 60.12 kg   Vital Signs: Temp: 99.3 F (37.4 C) (12/20 2005) Temp Source: Oral (12/20 2005) BP: 100/39 (12/20 2005) Pulse Rate: 65 (12/20 2005) Intake/Output from previous day: No intake/output data recorded. Intake/Output from this shift: No intake/output data recorded.  Labs: Recent Labs    10/10/17 1419  WBC 21.4*  HGB 11.4*  PLT 292  CREATININE 1.07*   Estimated Creatinine Clearance: 37.7 mL/min (A) (by C-G formula based on SCr of 1.07 mg/dL (H)). No results for input(s): VANCOTROUGH, VANCOPEAK, VANCORANDOM, GENTTROUGH, GENTPEAK, GENTRANDOM, TOBRATROUGH, TOBRAPEAK, TOBRARND, AMIKACINPEAK, AMIKACINTROU, AMIKACIN in the last 72 hours.   Microbiology: No results found for this or any previous visit (from the past 720 hour(s)).  Medical History: Past Medical History:  Diagnosis Date  . Depression   . 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 HTN (hypertension)   . Hypertension   . Neuropathy   . Nocturia   . Non-Hodgkin lymphoma ([place])    Bone Marrow Transplant with chemo + Rad tx's.  . Tachycardia   . Urge incontinence   . Urinary incontinence     Medications:  Medications Prior to Admission  Medication Sig Dispense Refill Last Dose  . acetaminophen (TYLENOL) 325  MG tablet Take 650 mg by mouth every 6 (six) hours as needed.   PRN at PRN  . apixaban (ELIQUIS) 5 MG TABS tablet Take 5 mg by mouth 2 (two) times daily.   10/10/2017 at 0800  . aspirin EC 81 MG tablet Take 1 tablet (81 mg total) by mouth daily. 150 tablet 2 10/10/2017 at 0800  . cholecalciferol (VITAMIN D) 1000 units tablet Take 1,000 Units by mouth daily.   10/10/2017 at 0800  . digoxin (LANOXIN) 0.125 MG tablet Take 0.125 mg by mouth daily.   10/10/2017 at 0800  . donepezil (ARICEPT) 10 MG tablet Take 10 mg by mouth at bedtime.   10/09/2017 at 2000  . DULoxetine (CYMBALTA) 30 MG capsule Take 30 mg by mouth daily.   10/10/2017 at 0800  . DULoxetine (CYMBALTA) 60 MG capsule Take 60 mg by mouth daily.   10/10/2017 at 0800  . furosemide (LASIX) 20 MG tablet Take 20 mg by mouth daily as needed for edema.   PRN at PRN  . loperamide (IMODIUM) 2 MG capsule Take 2 mg by mouth every 6 (six) hours as needed for diarrhea or loose stools.    PRN at PRN  . losartan (COZAAR) 25 MG tablet Take 25 mg by mouth daily.   10/10/2017 at 0800  . metoprolol tartrate (LOPRESSOR) 25 MG tablet Take 25 mg by mouth 2 (two) times daily.    10/10/2017 at 0800  . Multiple Vitamin (MULTIVITAMIN WITH MINERALS) TABS tablet Take 1 tablet by mouth daily.  10/10/2017 at 0800  . vitamin B-12 (CYANOCOBALAMIN) 1000 MCG tablet Take 1,000 mcg by mouth daily.   10/10/2017 at 0800  . atorvastatin (LIPITOR) 20 MG tablet Take 1 tablet (20 mg total) by mouth daily. (Patient not taking: Reported on 10/10/2017) 30 tablet 5 Not Taking at Unknown time   Assessment: CrCl = 37.7 ml/min Ke = 0.04 hr-1 T1/2 = 17.3 hrs Vd = 42 L   Goal of Therapy:  Vancomycin trough level 15-20 mcg/ml  Plan:  Expected duration 7 days with resolution of temperature and/or normalization of WBC   Vancomycin 1 gm IV X 1 to be given on 12/20 @ 21:00. Vancomycin 1 gm IV Q24H to start on 12/21 @ 0500 , ~ 8 hrs after 1st dose (stacked dosing). This pt will reach  Css by 12/24 @ 0900. Will draw 1st trough on 12/24 @ 0430 which will be very close to Css.   Saniyah Mondesir D 10/10/2017,8:43 PM

## 2017-10-10 NOTE — H&P (Signed)
[facility] [place] NAME: Mindy Cortez    MR#:  [id number]  DATE OF BIRTH:  [birth year]  DATE OF ADMISSION:  10/10/2017  PRIMARY CARE PHYSICIAN: Marinda Elk, MD   REQUESTING/REFERRING PHYSICIAN: Lavonia Drafts, MD  CHIEF COMPLAINT:   Chief Complaint  Patient presents with  . Altered Mental Status   Confusion and generalized weakness for 3-4 days. HISTORY OF PRESENT ILLNESS:  Mindy Cortez  is a 82 y.o. female with a known history of hypertension, neuropathy, UTI, incontinence and non-Hodgkin's lymphoma.  The patient was sent to ED from home due to above chief complaints.  The patient has no complaints at all.  But according to her daughter, she has confusion and generalized weakness for the past 3-4 days.  She was found hypotension, tachycardia and leukocytosis in the ED.  She is being treated with attacked him and vancomycin in the ED.  Urinalysis is unremarkable and the chest x-ray is unremarkable.  But the patient had UTI and is on antibiotics recently.  PAST MEDICAL HISTORY:   Past Medical History:  Diagnosis Date  . Depression   . 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 HTN (hypertension)   . Hypertension   . Neuropathy   . Nocturia   . Non-Hodgkin lymphoma ([place])    Bone Marrow Transplant with chemo + Rad tx's.  . Tachycardia   . Urge incontinence   . Urinary incontinence     PAST SURGICAL HISTORY:   Past Surgical History:  Procedure Laterality Date  . BONE MARROW TRANSPLANT    . COMPLICATED TOTAL HIP PROSTHESIS AND METHYLMETHACRALATE REMOVAL W/O SPACER INSERTION    . PERIPHERAL VASCULAR CATHETERIZATION Right 06/18/2016   Procedure: Lower Extremity Angiography;  Surgeon: Algernon Huxley, MD;  Location: [facility];  Service: Cardiovascular;  Laterality: Right;  . PERIPHERAL VASCULAR CATHETERIZATION  06/18/2016   Procedure: Lower Extremity Intervention;  Surgeon:  Algernon Huxley, MD;  Location: [facility];  Service: Cardiovascular;;  . TOTAL HIP ARTHROPLASTY  [id number]    SOCIAL HISTORY:   Social History   Tobacco Use  . Smoking status: Never Smoker  . Smokeless tobacco: Never Used  Substance Use Topics  . Alcohol use: Yes    Alcohol/week: 0.0 oz    Comment: rarely    FAMILY HISTORY:   Family History  Problem Relation Age of Onset  . Osteosarcoma Father   . Heart disease Father   . Heart disease Mother   . Breast cancer Mother     DRUG ALLERGIES:   Allergies  Allergen Reactions  . Penicillin V Potassium Hives  . Prochlorperazine Nausea And Vomiting    DIZZINESS, DRUNK FEELING. DIZZINESS, DRUNK FEELING.  . Ramipril Other (See Comments)    SEVERE HEADACHE SEVERE HEADACHE  . Rifampin     Severe stomach irriatates cramping and nausea  . Sulfa Antibiotics     TOLD AS A CHILD, MADE PT NERVOUS, EMOTIONAL, TOLD NOT TO TAKE.  . Cephalexin Rash    RASH, ITCHING. RASH, ITCHING.    REVIEW OF SYSTEMS:   Review of Systems  Constitutional: Negative for chills, fever and malaise/fatigue.  HENT: Negative for sore throat.   Eyes: Negative for blurred vision and double vision.  Respiratory: Negative for cough, hemoptysis, shortness of breath, wheezing and stridor.   Cardiovascular: Negative for chest pain, palpitations, orthopnea and leg swelling.  Gastrointestinal: Negative for  abdominal pain, blood in stool, diarrhea, melena, nausea and vomiting.  Genitourinary: Negative for dysuria, flank pain and hematuria.  Musculoskeletal: Negative for back pain and joint pain.  Neurological: Negative for dizziness, sensory change, focal weakness, seizures, loss of consciousness, weakness and headaches.  Endo/Heme/Allergies: Negative for polydipsia.  Psychiatric/Behavioral: Negative for depression. The patient is not nervous/anxious.     MEDICATIONS AT HOME:   Prior to Admission medications   Medication Sig Start Date End Date  Taking? Authorizing Provider  acetaminophen (TYLENOL) 325 MG tablet Take 650 mg by mouth every 6 (six) hours as needed.   Yes [provider]  apixaban (ELIQUIS) 5 MG TABS tablet Take 5 mg by mouth 2 (two) times daily.   Yes [provider]  aspirin EC 81 MG tablet Take 1 tablet (81 mg total) by mouth daily. 06/18/16  Yes Algernon Huxley, MD  cholecalciferol (VITAMIN D) 1000 units tablet Take 1,000 Units by mouth daily.   Yes [provider]  digoxin (LANOXIN) 0.125 MG tablet Take 0.125 mg by mouth daily.   Yes [provider]  donepezil (ARICEPT) 10 MG tablet Take 10 mg by mouth at bedtime.   Yes [provider]  DULoxetine (CYMBALTA) 30 MG capsule Take 30 mg by mouth daily.   Yes [provider]  DULoxetine (CYMBALTA) 60 MG capsule Take 60 mg by mouth daily.   Yes [provider]  furosemide (LASIX) 20 MG tablet Take 20 mg by mouth daily as needed for edema.   Yes [provider]  loperamide (IMODIUM) 2 MG capsule Take 2 mg by mouth every 6 (six) hours as needed for diarrhea or loose stools.    Yes [provider]  losartan (COZAAR) 25 MG tablet Take 25 mg by mouth daily.   Yes [provider]  metoprolol tartrate (LOPRESSOR) 25 MG tablet Take 25 mg by mouth 2 (two) times daily.    Yes [provider]  Multiple Vitamin (MULTIVITAMIN WITH MINERALS) TABS tablet Take 1 tablet by mouth daily.   Yes [provider]  vitamin B-12 (CYANOCOBALAMIN) 1000 MCG tablet Take 1,000 mcg by mouth daily.   Yes [provider]  atorvastatin (LIPITOR) 20 MG tablet Take 1 tablet (20 mg total) by mouth daily. Patient not taking: Reported on 10/10/2017 06/18/16   Algernon Huxley, MD      VITAL SIGNS:  Blood pressure (!) 100/58, pulse (!) 102, temperature 99.1 F (37.3 C), resp. rate (!) 24, height 5\' 6"  (1.676 m), weight 130 lb (59 kg), SpO2 96 %.  PHYSICAL EXAMINATION:  Physical Exam  GENERAL:  82  y.o.-year-old patient lying in the bed with no acute distress.  EYES: Pupils equal, round, reactive to light and accommodation. No scleral icterus. Extraocular muscles intact.  HEENT: Head atraumatic, normocephalic. Oropharynx and nasopharynx clear.  NECK:  Supple, no jugular venous distention. No thyroid enlargement, no tenderness.  LUNGS: Normal breath sounds bilaterally, no wheezing, rales,rhonchi or crepitation. No use of accessory muscles of respiration.  CARDIOVASCULAR: S1, S2 normal. No murmurs, rubs, or gallops.  ABDOMEN: Soft, nontender, nondistended. Bowel sounds present. No organomegaly or mass.  EXTREMITIES: No pedal edema, cyanosis, or clubbing.  NEUROLOGIC: Cranial nerves II through XII are intact. Muscle strength 3-4/5 in all extremities. Sensation intact. Gait not checked.  PSYCHIATRIC: The patient is alert and oriented x 3.  SKIN: No obvious rash, lesion, or ulcer.   LABORATORY PANEL:   CBC Recent Labs  Lab 10/10/17 1419  WBC 21.4*  HGB 11.4*  HCT 35.3  PLT 292   ------------------------------------------------------------------------------------------------------------------  Chemistries  Recent Labs  Lab 10/10/17 1419  NA 134*  K 3.9  CL 99*  CO2 21*  GLUCOSE 125*  BUN 20  CREATININE 1.07*  CALCIUM 9.0  AST 24  ALT 9*  ALKPHOS 137*  BILITOT 1.1   ------------------------------------------------------------------------------------------------------------------  Cardiac Enzymes Recent Labs  Lab 10/10/17 1419  TROPONINI <0.03   ------------------------------------------------------------------------------------------------------------------  RADIOLOGY:  Ct Head Wo Contrast  Result Date: 10/10/2017 CLINICAL DATA:  82 year old female with altered level of consciousness and headache. EXAM: CT HEAD WITHOUT CONTRAST TECHNIQUE: Contiguous axial images were obtained from the base of the skull through the vertex without intravenous contrast. COMPARISON:   10/28/2014 CT FINDINGS: Brain: No evidence of acute infarction, hemorrhage, hydrocephalus, extra-axial collection or mass lesion/mass effect. Atrophy and chronic small-vessel white matter ischemic changes again noted. Vascular: Atherosclerotic calcifications noted Skull: Normal. Negative for fracture or focal lesion. Sinuses/Orbits: No acute finding. Other: None IMPRESSION: 1. No evidence of acute intracranial abnormality 2. Atrophy and chronic small-vessel white matter ischemic changes Electronically Signed   By: Margarette Canada M.D.   On: 10/10/2017 15:11   Dg Chest Portable 1 View  Result Date: 10/10/2017 CLINICAL DATA:  L status change, fever, recent urinary tract infection. History of hypertension, CHF, tachycardia, bone marrow transplant for lymphoma. Never smoked. EXAM: PORTABLE CHEST 1 VIEW COMPARISON:  Chest x-ray of February 08, 2015. FINDINGS: The lungs are adequately inflated. The interstitial markings are mildly prominent but less conspicuous than on the earlier study. There is no alveolar infiltrate or pleural effusion. The cardiac silhouette is mildly enlarged. The central pulmonary vascularity is normal. The ICD is in stable position. The bony thorax exhibits no acute abnormality. IMPRESSION: Mild cardiomegaly with mild central pulmonary vascular prominence but decreased prominence of the pulmonary interstitium since the previous study. No acute pneumonia. Electronically Signed   By: David  Martinique M.D.   On: 10/10/2017 15:36   Dg Hip Unilat With Pelvis 2-3 Views Right  Result Date: 10/10/2017 CLINICAL DATA:  Pain in the right hip EXAM: DG HIP (WITH OR WITHOUT PELVIS) 2-3V RIGHT COMPARISON:  CT 09/05/2017 FINDINGS: No fracture or dislocation involving the left hip. The pubic symphysis is intact. Extensive chronic deformity of the right hip with remodeling and diffuse thinning of the right acetabulum with mixed lucency and scleroses, grossly similar to the prior CT from November allowing for  limitations of inter modality comparison. There is been prior resection of the right femoral head and neck. Slight irregularity at the cut surface probably stable compared to the recent CT. Multiple calcifications or osseous fragments projecting over the right hip and inferior pelvis. No gross fracture is seen. Cerclage wires at the proximal femur and over the right hip. Femoral shaft appears osteopenic. IMPRESSION: 1. Extensive chronic deformity of the right acetabulum with evidence of prior resection of the right femoral head and neck. There is no gross fracture seen. Electronically Signed   By: Donavan Foil M.D.   On: 10/10/2017 17:10      IMPRESSION AND PLAN:   Sepsis and hypotension The patient will be admitted to medical floor. Start sepsis protocol, continue vancomycin, Bactrim and Levaquin pharmacy to dose.  No saline IV.  Follow-up CBC and cultures.   Lactic acidosis.  Treatment as above in the follow-up lactic acid.  Dehydration and hyponatremia.  IV fluids support and follow-up BMP.  Hypertension.  Hold hypertension medication due to hypotension.  All the records are  reviewed and case discussed with ED provider. Management plans discussed with the patient, her daughter and they are in agreement.  CODE STATUS: Full code  TOTAL TIME TAKING CARE OF THIS PATIENT: 56 minutes.    Demetrios Loll M.D on 10/10/2017 at 6:38 PM  Between 7am to 6pm - Pager - [phone]  After 6pm go to [email or web address] - Proofreader  [facility]  Office  [phone]  CC: Primary care physician; Marinda Elk, MD   Note: This dictation was prepared with Dragon dictation along with smaller phrase technology. Any transcriptional errors that result from this process are unin

## 2017-10-11 ENCOUNTER — Inpatient Hospital Stay: Payer: Medicare Other

## 2017-10-11 LAB — CBC
HCT: 27.2 % — ABNORMAL LOW (ref 35.0–47.0)
HEMOGLOBIN: 9 g/dL — AB (ref 12.0–16.0)
MCH: 28.2 pg (ref 26.0–34.0)
MCHC: 32.9 g/dL (ref 32.0–36.0)
MCV: 85.8 fL (ref 80.0–100.0)
Platelets: 220 10*3/uL (ref 150–440)
RBC: 3.17 MIL/uL — AB (ref 3.80–5.20)
RDW: 14.9 % — ABNORMAL HIGH (ref 11.5–14.5)
WBC: 18.7 10*3/uL — AB (ref 3.6–11.0)

## 2017-10-11 LAB — BASIC METABOLIC PANEL
Anion gap: 9 (ref 5–15)
BUN: 17 mg/dL (ref 6–20)
CO2: 20 mmol/L — ABNORMAL LOW (ref 22–32)
CREATININE: 0.93 mg/dL (ref 0.44–1.00)
Calcium: 7.8 mg/dL — ABNORMAL LOW (ref 8.9–10.3)
Chloride: 104 mmol/L (ref 101–111)
GFR calc Af Amer: >60 mL/min (ref >60)
GFR, EST NON AFRICAN AMERICAN: 57 mL/min — AB (ref >60)
Glucose, Bld: 108 mg/dL — ABNORMAL HIGH (ref 65–99)
Potassium: 3.5 mmol/L (ref 3.5–5.1)
SODIUM: 133 mmol/L — AB (ref 135–145)

## 2017-10-11 NOTE — Consult Note (Addendum)
WOC consult requested for foot wound prior to podiatry consult.  Dr Elvina Mattes has seen the patient and provided topical treatment orders for the bedside nurses to perform. Please refer to their team for further questions. Please re-consult if further assistance is needed.  Thank-you,  Julien Girt MSN, [place], [facility] [place], [place], [place]

## 2017-10-11 NOTE — Progress Notes (Signed)
[facility] at [place] NAME: Mindy Cortez    MR#:  [id number]  DATE OF BIRTH:  February 02, 1935  SUBJECTIVE:   Patient with dementia Here with weakness found to have fevers and elevated WBC family at bedside REVIEW OF SYSTEMS:    Unable to obtain due to dementia   Tolerating Diet: yes      DRUG ALLERGIES:   Allergies  Allergen Reactions  . Penicillin V Potassium Hives  . Prochlorperazine Nausea And Vomiting    DIZZINESS, DRUNK FEELING. DIZZINESS, DRUNK FEELING.  . Ramipril Other (See Comments)    SEVERE HEADACHE SEVERE HEADACHE  . Rifampin     Severe stomach irriatates cramping and nausea  . Sulfa Antibiotics     TOLD AS A CHILD, MADE PT NERVOUS, EMOTIONAL, TOLD NOT TO TAKE.  . Cephalexin Rash    RASH, ITCHING. RASH, ITCHING.    VITALS:  Blood pressure (!) 101/42, pulse (!) 107, temperature 98.7 F (37.1 C), temperature source Oral, resp. rate 20, height 5\' 5"  (1.651 m), weight 64.8 kg (142 lb 13.7 oz), SpO2 95 %.  PHYSICAL EXAMINATION:  Constitutional: Appears well-developed and well-nourished. No distress. HENT: Normocephalic. Marland Kitchen Oropharynx is clear and moist.  Eyes: Conjunctivae and EOM are normal. PERRLA, no scleral icterus.  Neck: Normal ROM. Neck supple. No JVD. No tracheal deviation. CVS: RRR, S1/S2 +, no murmurs, no gallops, no carotid bruit.  Pulmonary: Effort and breath sounds normal, no stridor, rhonchi, wheezes, rales.  Abdominal: Soft. BS +,  no distension, tenderness, rebound or guarding.  Musculoskeletal: Normal range of motion. No edema and no tenderness.  Neuro: Alert. CN 2-12 grossly intact. No focal deficits. Skin: Right foot second toe is red  Psychiatric: Confused with dementia    LABORATORY PANEL:   CBC Recent Labs  Lab 10/11/17 0519  WBC 18.7*  HGB 9.0*  HCT 27.2*  PLT 220    ------------------------------------------------------------------------------------------------------------------  Chemistries  Recent Labs  Lab 10/10/17 1419 10/11/17 0519  NA 134* 133*  K 3.9 3.5  CL 99* 104  CO2 21* 20*  GLUCOSE 125* 108*  BUN 20 17  CREATININE 1.07* 0.93  CALCIUM 9.0 7.8*  AST 24  --   ALT 9*  --   ALKPHOS 137*  --   BILITOT 1.1  --    ------------------------------------------------------------------------------------------------------------------  Cardiac Enzymes Recent Labs  Lab 10/10/17 1419  TROPONINI <0.03   ------------------------------------------------------------------------------------------------------------------  RADIOLOGY:  Ct Head Wo Contrast  Result Date: 10/10/2017 CLINICAL DATA:  82 year old female with altered level of consciousness and headache. EXAM: CT HEAD WITHOUT CONTRAST TECHNIQUE: Contiguous axial images were obtained from the base of the skull through the vertex without intravenous contrast. COMPARISON:  10/28/2014 CT FINDINGS: Brain: No evidence of acute infarction, hemorrhage, hydrocephalus, extra-axial collection or mass lesion/mass effect. Atrophy and chronic small-vessel white matter ischemic changes again noted. Vascular: Atherosclerotic calcifications noted Skull: Normal. Negative for fracture or focal lesion. Sinuses/Orbits: No acute finding. Other: None IMPRESSION: 1. No evidence of acute intracranial abnormality 2. Atrophy and chronic small-vessel white matter ischemic changes Electronically Signed   By: Margarette Canada M.D.   On: 10/10/2017 15:11   Dg Chest Portable 1 View  Result Date: 10/10/2017 CLINICAL DATA:  L status change, fever, recent urinary tract infection. History of hypertension, CHF, tachycardia, bone marrow transplant for lymphoma. Never smoked. EXAM: PORTABLE CHEST 1 VIEW COMPARISON:  Chest x-ray of February 08, 2015. FINDINGS: The lungs are adequately inflated. The interstitial markings are mildly  prominent  but less conspicuous than on the earlier study. There is no alveolar infiltrate or pleural effusion. The cardiac silhouette is mildly enlarged. The central pulmonary vascularity is normal. The ICD is in stable position. The bony thorax exhibits no acute abnormality. IMPRESSION: Mild cardiomegaly with mild central pulmonary vascular prominence but decreased prominence of the pulmonary interstitium since the previous study. No acute pneumonia. Electronically Signed   By: David  Martinique M.D.   On: 10/10/2017 15:36   Dg Foot Complete Right  Result Date: 10/11/2017 CLINICAL DATA:  Right foot pain today. EXAM: RIGHT FOOT COMPLETE - 3+ VIEW COMPARISON:  None. FINDINGS: Possible nondisplaced fracture at the base of the fifth metatarsal. Examination technically limited due to difficulties with positioning. No additional acute fracture. Diffuse osteopenia/ osteoporosis. Hammertoe deformity of the digits. Mild osteoarthritis of the first metatarsal phalangeal joint. IMPRESSION: Possible nondisplaced fracture at the base of the fifth metatarsal. Recommend correlation for focal tenderness. Osseous under mineralization. Electronically Signed   By: Jeb Levering M.D.   On: 10/11/2017 06:45   Dg Hip Unilat With Pelvis 2-3 Views Right  Result Date: 10/10/2017 CLINICAL DATA:  Pain in the right hip EXAM: DG HIP (WITH OR WITHOUT PELVIS) 2-3V RIGHT COMPARISON:  CT 09/05/2017 FINDINGS: No fracture or dislocation involving the left hip. The pubic symphysis is intact. Extensive chronic deformity of the right hip with remodeling and diffuse thinning of the right acetabulum with mixed lucency and scleroses, grossly similar to the prior CT from November allowing for limitations of inter modality comparison. There is been prior resection of the right femoral head and neck. Slight irregularity at the cut surface probably stable compared to the recent CT. Multiple calcifications or osseous fragments projecting over the  right hip and inferior pelvis. No gross fracture is seen. Cerclage wires at the proximal femur and over the right hip. Femoral shaft appears osteopenic. IMPRESSION: 1. Extensive chronic deformity of the right acetabulum with evidence of prior resection of the right femoral head and neck. There is no gross fracture seen. Electronically Signed   By: Donavan Foil M.D.   On: 10/10/2017 17:10     ASSESSMENT AND PLAN:    82 year old female with remote history of non-Hodgkin's lymphoma, depression and some cognitive impairment/dementia who presents from facility with confusion.  1. Acute encephalopathy due to sepsis on top of cognitive impairment/dementia  2. Sepsis: Patient presents with fever, tachycardia and leukocytosis  UA did not show evidence of UTI. Chest x-ray did not show evidence of pneumonia. Blood cultures are negative thus far. Right foot second toe does appear red Continue aztreonam and vancomycin. Follow-up on final blood culture Podiatry consultation requested regarding the right foot as well as possible fifth metatarsal fracture is seen on x-ray Wound care consultation for the right foot  3. Generalized weakness: Physical therapy consultation requested  4. Dementia: Continue donepezil  5. Depression: Continue Cymbalta 6. PAF: Continue digoxin and Eliquis No Metoprolol due to low BP  7. Hyponatremia: Follow sodium level  8. Chronic systolic heart failure ejection fraction 25%: Stop IV fluids  Monitor intake and output Follows with Dr. Ubaldo Glassing  Management plans discussed with the family and they are is in agreement.  CODE STATUS: FULL  TOTAL TIME TAKING CARE OF THIS PATIENT: 30 minutes.     POSSIBLE D/C 2 days, DEPENDING ON CLINICAL CONDITION.   Carrianne Hyun M.D on 10/11/2017 at 9:59 AM  Between 7am to 6pm - Pager - [phone] After 6pm go to [email or web address] - [place]  [place]  [phone]  CC: Primary care  physician; Marinda Elk, MD  Note: This dictation was prepared with Dragon dictation along with smaller phrase technology. Any transcriptional errors that result from this process are unintentional.

## 2017-10-11 NOTE — Clinical Social Work Note (Signed)
Mindy Cortez returned Mindy Cortez phone call and she stated that she would be able to take patient back and that patient needs assistance with all her ADL's. Mindy Cortez stated that patient only can pivot and that she uses an Clinical research associate. CSW will follow up with patient's son to determine if they wish for patient to return with Mindy Cortez at [place].  Mindy Cortez MSW,LCSW [phone]

## 2017-10-11 NOTE — Progress Notes (Signed)
Pt complaining of pain in the right hand and foot. Right foot swollen, red and hot. Right second toe has a small scabbed blister/wound to the top and is red. MD notified and XRay of foot done to RO fracture. Right hand has little swelling.

## 2017-10-11 NOTE — Care Management Important Message (Signed)
Important Message  Patient Details  Name: Mindy Cortez MRN: [id number] Date of Birth: 06-04-1935   Medicare Important Message Given:  Yes    Beverly Sessions, RN 10/11/2017, 2:58 PM

## 2017-10-11 NOTE — Evaluation (Signed)
Physical Therapy Evaluation Patient Details Name: Mindy Cortez MRN: [id number] DOB: 26-Sep-1935 Today's Date: 10/11/2017   History of Present Illness  82 y/o female here with sepsis.  She had 1-2 days with increasing weakness found to have fevers and elevated WBC.  Pt with dementia at baseline, more confused at time of PT exam.  Clinical Impression  Pt is eager to try to work with PT but struggled to stay on task, showed considerably more weakness than she apparently has at baseline.  She was able to maintain sitting balance relatively well, but struggled to get to sitting or even initiate consistent effort to change position.g    Follow Up Recommendations SNF    Equipment Recommendations       Recommendations for Other Services       Precautions / Restrictions Precautions Precautions: Fall Restrictions Weight Bearing Restrictions: Yes Other Position/Activity Restrictions: pt had R hip removed, does not/cannot use it for WBing      Mobility  Bed Mobility Overal bed mobility: Needs Assistance Bed Mobility: Supine to Sit;Sit to Supine     Supine to sit: Mod assist;Max assist Sit to supine: Mod assist   General bed mobility comments: Pt typically can get up to EOB w/o issue, she needed excessive cuing/encouragement and ultimately needed considerable assist just to assume sitting at EOB.    Transfers                 General transfer comment: deferred secondary to weakness, confusion.  Pt unsafe to transfer w/o lift/total assist  Ambulation/Gait                Stairs            Wheelchair Mobility    Modified Rankin (Stroke Patients Only)       Balance Overall balance assessment: Needs assistance Sitting-balance support: Bilateral upper extremity supported Sitting balance-Leahy Scale: Fair Sitting balance - Comments: Pt inconsistent with ability to hold herself upright in sitting                                     Pertinent  Vitals/Pain Pain Assessment: 0-10 Pain Score: (unrated) Pain Location: c/o vague L heel pain, is hypersensitive to L hand palpation    Home Living Family/patient expects to be discharged to:: Assisted living               Home Equipment: Tub bench;Wheelchair - power      Prior Function Level of Independence: Needs assistance         Comments: Pt can transfer to/from motorized w/c, but needs assist with plenty of tasks     Hand Dominance        Extremity/Trunk Assessment   Upper Extremity Assessment Upper Extremity Assessment: Generalized weakness(R UE grossly 3+ to 4-/5, L UE grossly 3-/5)    Lower Extremity Assessment Lower Extremity Assessment: Generalized weakness(R LE with no AROM/poor sensation, L LE grossly 4-/5)       Communication   Communication: (Pt with somewhat rambling conversation)  Cognition Arousal/Alertness: Awake/alert Behavior During Therapy: Restless Overall Cognitive Status: Impaired/Different from baseline                                 General Comments: baseline dementia, per grand daughter she is more confused than her normal      General Comments  Exercises     Assessment/Plan    PT Assessment Patient needs continued PT services  PT Problem List         PT Treatment Interventions DME instruction;Functional mobility training;Therapeutic activities;Therapeutic exercise;Balance training;Neuromuscular re-education;Patient/family education    PT Goals (Current goals can be found in the Care Plan section)  Acute Rehab PT Goals Patient Stated Goal: unable to state PT Goal Formulation: With patient Time For Goal Achievement: 10/25/17 Potential to Achieve Goals: Fair    Frequency Min 2X/week   Barriers to discharge        Co-evaluation               AM-PAC PT "6 Clicks" Daily Activity  Outcome Measure Difficulty turning over in bed (including adjusting bedclothes, sheets and blankets)?:  Unable Difficulty moving from lying on back to sitting on the side of the bed? : Unable Difficulty sitting down on and standing up from a chair with arms (e.g., wheelchair, bedside commode, etc,.)?: Unable Help needed moving to and from a bed to chair (including a wheelchair)?: Total Help needed walking in hospital room?: Total Help needed climbing 3-5 steps with a railing? : Total 6 Click Score: 6    End of Session   Activity Tolerance: Patient limited by fatigue Patient left: with bed alarm set;with family/visitor present;with call bell/phone within reach Nurse Communication: Mobility status PT Visit Diagnosis: Muscle weakness (generalized) (M62.81)    Time: 1470-9295 PT Time Calculation (min) (ACUTE ONLY): 25 min   Charges:   PT Evaluation $PT Eval Low Complexity: 1 Low     PT G CodesKreg Shropshire, DPT 10/11/2017, 4:43 PM

## 2017-10-11 NOTE — Clinical Social Work Note (Signed)
Clinical Social Work Assessment  Patient Details  Name: Mindy Cortez MRN: [id number] Date of Birth: 1934-11-08  Date of referral:  10/11/17               Reason for consult:  Discharge Planning                Permission sought to share information with:    Permission granted to share information::     Name::        Agency::     Relationship::     Contact Information:     Housing/Transportation Living arrangements for the past 2 months:  [place] of Information:  Adult Children Patient Interpreter Needed:  None Criminal Activity/Legal Involvement Pertinent to Current Situation/Hospitalization:  No - Comment as needed Significant Relationships:  Adult Children Lives with:  Facility Resident Do you feel safe going back to the place where you live?  Yes Need for family participation in patient care:  Yes (Comment)  Care giving concerns:  Patient resides at [facility]. Owner: [name]: [phone].    Social Worker assessment / plan:  PT worked with patient and stated that patient is not near baseline and that it was reported that she was driving and doing some things independently. PT has recommended STR. CSW spoke with patient's daughter: Mindy Cortez: [phone] and informed her of PT recommendations. Mrs. Mindy Cortez stated that patient has not been independent in a long time and that she has not driven in years. Mrs. Mindy Cortez stated that the family drives her where she needs to go and that she is confused at baseline but changing her environment confuses her more. Mrs. Mindy Cortez stated that her brother: Mindy Cortez: [phone] is the HPOA/POA and that even though they all talk and make decisions together for their mom, Mindy Cortez decision is the final one. CSW attempted to call Mindy Cortez but had to leave a message and CSW attempted to call Mindy Cortez but had to leave a message as well.   At this time, it will be up to Mindy Cortez to make the decision  regarding STR or not but also at this time, patient's daughter does not believe that patient would benefit from it.  Employment status:  Retired Nurse, adult PT Recommendations:  [place] / Referral to community resources:     Patient/Family's Response to care:  Patient's daughter expressed appreciation for CSW assistance.   Patient/Family's Understanding of and Emotional Response to Diagnosis, Current Treatment, and Prognosis:  Patient's children are very involved in patient's care.   Emotional Assessment Appearance:    Attitude/Demeanor/Rapport:    Affect (typically observed):    Orientation:  Oriented to Self Alcohol / Substance use:  Not Applicable Psych involvement (Current and /or in the community):  No (Comment)  Discharge Needs  Concerns to be addressed:  Care Coordination Readmission within the last 30 days:  No Current discharge risk:  None Barriers to Discharge:  No Barriers Identified   Mindy Leff, LCSW 10/11/2017, 3:54 PM

## 2017-10-11 NOTE — Plan of Care (Signed)
Pt still with AMS but is improving.Pt still afebrile. No BM for C Diff collection. Right foot cleaned and dressed by podiatry

## 2017-10-11 NOTE — Consult Note (Signed)
Patient Demographics  Mindy Cortez, is a 82 y.o. female   MRN: [id number]   DOB - 05/08/1935  Admit Date - 10/10/2017    Outpatient Primary MD for the patient is Marinda Elk, MD  Consult requested in the Hospital by Bettey Costa, MD, On 10/11/2017    Reason for consult consult was written to evaluate a fracture of the right foot but when speaking with the family member more concerned about a lesion on the second toe of the right foot.   With History of -  Past Medical History:  Diagnosis Date  . Depression   . 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 HTN (hypertension)   . Hypertension   . Neuropathy   . Nocturia   . Non-Hodgkin lymphoma ([place])    Bone Marrow Transplant with chemo + Rad tx's.  . Tachycardia   . Urge incontinence   . Urinary incontinence       Past Surgical History:  Procedure Laterality Date  . BONE MARROW TRANSPLANT    . COMPLICATED TOTAL HIP PROSTHESIS AND METHYLMETHACRALATE REMOVAL W/O SPACER INSERTION    . PERIPHERAL VASCULAR CATHETERIZATION Right 06/18/2016   Procedure: Lower Extremity Angiography;  Surgeon: Algernon Huxley, MD;  Location: [facility];  Service: Cardiovascular;  Laterality: Right;  . PERIPHERAL VASCULAR CATHETERIZATION  06/18/2016   Procedure: Lower Extremity Intervention;  Surgeon: Algernon Huxley, MD;  Location: [facility];  Service: Cardiovascular;;  . TOTAL HIP ARTHROPLASTY  [id number]    in for   Chief Complaint  Patient presents with  . Altered Mental Status     HPI  Mindy Cortez  is a 82 y.o. female, history of periodic ulcerations on the right foot and leg which usually a result of the stresses from lack of function in that foot. Patient is mobile in a wheelchair only. 9 history of injury to the right foot. X-rays taken yesterday were questionable about fifth metatarsal fracture and no injury history  to that place. Concerns about second toe where there is an open sore on the toe.    Review of Systems    In addition to the HPI above,  No Fever-chills, No Headache, No changes with Vision or hearing, No problems swallowing food or Liquids, No Chest pain, Cough or Shortness of Breath, No Abdominal pain, No Nausea or Vommitting, Bowel movements are regular, No Blood in stool or Urine, No dysuria, No new skin rashes or bruises, Musculoskeletal patient has absence of a functional hip joint on the right side and not bear weight.,  No new weakness, tingling, numbness in any extremity, No recent weight gain or loss, No polyuria, polydypsia or polyphagia, No significant Mental Stressors.  A full 10 point Review of Systems was done, except as stated above, all other Review of Systems were negative.   Social History Social History   Tobacco Use  . Smoking status: Never Smoker  . Smokeless tobacco: Never Used  Substance Use Topics  . Alcohol use: Yes    Alcohol/week: 0.0 oz    Comment: rarely     Family History Family History  Problem  Relation Age of Onset  . Osteosarcoma Father   . Heart disease Father   . Heart disease Mother   . Breast cancer Mother      Prior to Admission medications   Medication Sig Start Date End Date Taking? Authorizing Provider  acetaminophen (TYLENOL) 325 MG tablet Take 650 mg by mouth every 6 (six) hours as needed.   Yes [provider]  apixaban (ELIQUIS) 5 MG TABS tablet Take 5 mg by mouth 2 (two) times daily.   Yes [provider]  aspirin EC 81 MG tablet Take 1 tablet (81 mg total) by mouth daily. 06/18/16  Yes Algernon Huxley, MD  cholecalciferol (VITAMIN D) 1000 units tablet Take 1,000 Units by mouth daily.   Yes [provider]  digoxin (LANOXIN) 0.125 MG tablet Take 0.125 mg by mouth daily.   Yes [provider]  donepezil (ARICEPT) 10 MG tablet Take 10 mg by mouth at bedtime.   Yes [provider]   DULoxetine (CYMBALTA) 30 MG capsule Take 30 mg by mouth daily.   Yes [provider]  DULoxetine (CYMBALTA) 60 MG capsule Take 60 mg by mouth daily.   Yes [provider]  furosemide (LASIX) 20 MG tablet Take 20 mg by mouth daily as needed for edema.   Yes [provider]  loperamide (IMODIUM) 2 MG capsule Take 2 mg by mouth every 6 (six) hours as needed for diarrhea or loose stools.    Yes [provider]  losartan (COZAAR) 25 MG tablet Take 25 mg by mouth daily.   Yes [provider]  metoprolol tartrate (LOPRESSOR) 25 MG tablet Take 25 mg by mouth 2 (two) times daily.    Yes [provider]  Multiple Vitamin (MULTIVITAMIN WITH MINERALS) TABS tablet Take 1 tablet by mouth daily.   Yes [provider]  vitamin B-12 (CYANOCOBALAMIN) 1000 MCG tablet Take 1,000 mcg by mouth daily.   Yes [provider]  atorvastatin (LIPITOR) 20 MG tablet Take 1 tablet (20 mg total) by mouth daily. Patient not taking: Reported on 10/10/2017 06/18/16   Algernon Huxley, MD    Anti-infectives (From admission, onward)   Start     Dose/Rate Route Frequency Ordered Stop   10/12/17 1000  levofloxacin (LEVAQUIN) IVPB 750 mg  Status:  Discontinued     750 mg 100 mL/hr over 90 Minutes Intravenous Every 48 hours 10/10/17 1851 10/11/17 1004   10/11/17 0600  aztreonam (AZACTAM) 2 g in dextrose 5 % 50 mL IVPB     2 g 100 mL/hr over 30 Minutes Intravenous Every 8 hours 10/10/17 1851     10/11/17 0500  vancomycin (VANCOCIN) IVPB 1000 mg/200 mL premix     1,000 mg 200 mL/hr over 60 Minutes Intravenous Every 24 hours 10/10/17 2032     10/10/17 2100  vancomycin (VANCOCIN) IVPB 1000 mg/200 mL premix     1,000 mg 200 mL/hr over 60 Minutes Intravenous  Once 10/10/17 2032 10/10/17 2205   10/10/17 1515  levofloxacin (LEVAQUIN) IVPB 750 mg     750 mg 100 mL/hr over 90 Minutes Intravenous  Once 10/10/17 1511 10/10/17 1713   10/10/17 1515  aztreonam (AZACTAM) 2 g  in dextrose 5 % 50 mL IVPB     2 g 100 mL/hr over 30 Minutes Intravenous  Once 10/10/17 1511 10/10/17 1801      Scheduled Meds: . apixaban  5 mg Oral BID  . aspirin EC  81 mg Oral Daily  .  Chlorhexidine Gluconate Cloth  6 each Topical Q0600  . digoxin  0.125 mg Oral Daily  . donepezil  10 mg Oral QHS  . DULoxetine  30 mg Oral Daily  . DULoxetine  60 mg Oral Daily  . mupirocin ointment  1 application Nasal BID   Continuous Infusions: . aztreonam Stopped (10/11/17 8333)  . vancomycin Stopped (10/11/17 0600)   PRN Meds:.acetaminophen **OR** acetaminophen, albuterol, HYDROcodone-acetaminophen, loperamide, ondansetron **OR** ondansetron (ZOFRAN) IV, senna-docusate  Allergies  Allergen Reactions  . Penicillin V Potassium Hives  . Prochlorperazine Nausea And Vomiting    DIZZINESS, DRUNK FEELING. DIZZINESS, DRUNK FEELING.  . Ramipril Other (See Comments)    SEVERE HEADACHE SEVERE HEADACHE  . Rifampin     Severe stomach irriatates cramping and nausea  . Sulfa Antibiotics     TOLD AS A CHILD, MADE PT NERVOUS, EMOTIONAL, TOLD NOT TO TAKE.  . Cephalexin Rash    RASH, ITCHING. RASH, ITCHING.    Physical Exam  Vitals  Blood pressure (!) 101/42, pulse (!) 107, temperature 98.7 F (37.1 C), temperature source Oral, resp. rate 20, height 5\' 5"  (1.651 m), weight 64.8 kg (142 lb 13.7 oz), SpO2 95 %.  Lower Extremity exam:  Vascular: Diminished PT/PTT pulses bilaterally but according to the patient's family member she has had angioplasty to improve lower extremity vascular flow. Toes have good capillary refill time at this point and are warm.  Dermatological: Patient has an ulcerative change on the medial second toe at the DIPJ level. Debridement and removal of devitalized tissue through an excisional process today showed this was a very superficial lesion proximately 5 mm in diameter with only 2-3 mm of depth involving skin and skin structures only. The second toe is slightly  erythematous and edematous but is fairly mild and does not appear to be grossly infected. There is no purulent drainage from the area.  Neurological: Some peripheral neuropathy of uncertain origin  Ortho: Nonfunctional right hip. Patient has had a failed hip replacement; it just had to have a hip joint removed in its entirety and she's nonfunctional on that side.  Data Review  CBC Recent Labs  Lab 10/10/17 1419 10/11/17 0519  WBC 21.4* 18.7*  HGB 11.4* 9.0*  HCT 35.3 27.2*  PLT 292 220  MCV 85.8 85.8  MCH 27.8 28.2  MCHC 32.4 32.9  RDW 15.2* 14.9*  LYMPHSABS 0.3*  --   MONOABS 1.3*  --   EOSABS 0.0  --   BASOSABS 0.1  --    ------------------------------------------------------------------------------------------------------------------  Chemistries  Recent Labs  Lab 10/10/17 1419 10/11/17 0519  NA 134* 133*  K 3.9 3.5  CL 99* 104  CO2 21* 20*  GLUCOSE 125* 108*  BUN 20 17  CREATININE 1.07* 0.93  CALCIUM 9.0 7.8*  AST 24  --   ALT 9*  --   ALKPHOS 137*  --   BILITOT 1.1  --    ----------------------------------------------------------------------------------Imaging results:   Ct Head Wo Contrast  Result Date: 10/10/2017 CLINICAL DATA:  82 year old female with altered level of consciousness and headache. EXAM: CT HEAD WITHOUT CONTRAST TECHNIQUE: Contiguous axial images were obtained from the base of the skull through the vertex without intravenous contrast. COMPARISON:  10/28/2014 CT FINDINGS: Brain: No evidence of acute infarction, hemorrhage, hydrocephalus, extra-axial collection or mass lesion/mass effect. Atrophy and chronic small-vessel white matter ischemic changes again noted. Vascular: Atherosclerotic calcifications noted Skull: Normal. Negative for fracture or focal lesion. Sinuses/Orbits: No acute finding. Other: None IMPRESSION: 1. No evidence of  acute intracranial abnormality 2. Atrophy and chronic small-vessel white matter ischemic changes Electronically  Signed   By: Margarette Canada M.D.   On: 10/10/2017 15:11   Dg Chest Portable 1 View  Result Date: 10/10/2017 CLINICAL DATA:  L status change, fever, recent urinary tract infection. History of hypertension, CHF, tachycardia, bone marrow transplant for lymphoma. Never smoked. EXAM: PORTABLE CHEST 1 VIEW COMPARISON:  Chest x-ray of February 08, 2015. FINDINGS: The lungs are adequately inflated. The interstitial markings are mildly prominent but less conspicuous than on the earlier study. There is no alveolar infiltrate or pleural effusion. The cardiac silhouette is mildly enlarged. The central pulmonary vascularity is normal. The ICD is in stable position. The bony thorax exhibits no acute abnormality. IMPRESSION: Mild cardiomegaly with mild central pulmonary vascular prominence but decreased prominence of the pulmonary interstitium since the previous study. No acute pneumonia. Electronically Signed   By: David  Martinique M.D.   On: 10/10/2017 15:36   Dg Foot Complete Right  Result Date: 10/11/2017 CLINICAL DATA:  Right foot pain today. EXAM: RIGHT FOOT COMPLETE - 3+ VIEW COMPARISON:  None. FINDINGS: Possible nondisplaced fracture at the base of the fifth metatarsal. Examination technically limited due to difficulties with positioning. No additional acute fracture. Diffuse osteopenia/ osteoporosis. Hammertoe deformity of the digits. Mild osteoarthritis of the first metatarsal phalangeal joint. IMPRESSION: Possible nondisplaced fracture at the base of the fifth metatarsal. Recommend correlation for focal tenderness. Osseous under mineralization. Electronically Signed   By: Jeb Levering M.D.   On: 10/11/2017 06:45   Dg Hip Unilat With Pelvis 2-3 Views Right  Result Date: 10/10/2017 CLINICAL DATA:  Pain in the right hip EXAM: DG HIP (WITH OR WITHOUT PELVIS) 2-3V RIGHT COMPARISON:  CT 09/05/2017 FINDINGS: No fracture or dislocation involving the left hip. The pubic symphysis is intact. Extensive chronic deformity  of the right hip with remodeling and diffuse thinning of the right acetabulum with mixed lucency and scleroses, grossly similar to the prior CT from November allowing for limitations of inter modality comparison. There is been prior resection of the right femoral head and neck. Slight irregularity at the cut surface probably stable compared to the recent CT. Multiple calcifications or osseous fragments projecting over the right hip and inferior pelvis. No gross fracture is seen. Cerclage wires at the proximal femur and over the right hip. Femoral shaft appears osteopenic. IMPRESSION: 1. Extensive chronic deformity of the right acetabulum with evidence of prior resection of the right femoral head and neck. There is no gross fracture seen. Electronically Signed   By: Donavan Foil M.D.   On: 10/10/2017 17:10    Assessment & plan: Wound on the second toes very superficial may have had a little small amount of cellulitis surrounding the region but I doubt this is the cause of her sepsis. There is no deep penetration is isolated just the skin without much progression of cellulitis and no purulent drainage. There is no swelling or redness to the fifth metatarsal tuberosity on the right foot this was the point in question regarding the x-rays. I evaluated his x-rays and see no evidence of a fracture to the fifth metatarsal base. I recommended this point to apply wet-to-dry saline dressing to the toe. Does not need to be wrapped with a conformer gauze just rapid toe with the gauze wet with saline and let her wear foot E Evert hold it in place. This should allow this area to heal up uneventfully. She does not specifically need follow-up with  me unless other problems develop or if this fails to progress.   Family Communication: Plan discussed with patient and her family    Perry Mount M.D on 10/11/2017 at 11:56 AM  Thank you for the consult, we will follow the patient with you in the Hospital.

## 2017-10-12 LAB — CBC
HEMATOCRIT: 30.7 % — AB (ref 35.0–47.0)
Hemoglobin: 10 g/dL — ABNORMAL LOW (ref 12.0–16.0)
MCH: 28 pg (ref 26.0–34.0)
MCHC: 32.5 g/dL (ref 32.0–36.0)
MCV: 86.2 fL (ref 80.0–100.0)
PLATELETS: 248 10*3/uL (ref 150–440)
RBC: 3.56 MIL/uL — ABNORMAL LOW (ref 3.80–5.20)
RDW: 15.5 % — AB (ref 11.5–14.5)
WBC: 17.2 10*3/uL — AB (ref 3.6–11.0)

## 2017-10-12 LAB — BASIC METABOLIC PANEL
Anion gap: 7 (ref 5–15)
BUN: 12 mg/dL (ref 6–20)
CALCIUM: 8.2 mg/dL — AB (ref 8.9–10.3)
CHLORIDE: 106 mmol/L (ref 101–111)
CO2: 23 mmol/L (ref 22–32)
CREATININE: 0.71 mg/dL (ref 0.44–1.00)
GFR calc Af Amer: >60 mL/min (ref >60)
GFR calc non Af Amer: >60 mL/min (ref >60)
GLUCOSE: 104 mg/dL — AB (ref 65–99)
Potassium: 3.1 mmol/L — ABNORMAL LOW (ref 3.5–5.1)
Sodium: 136 mmol/L (ref 135–145)

## 2017-10-12 LAB — MAGNESIUM: Magnesium: 1.6 mg/dL — ABNORMAL LOW (ref 1.7–2.4)

## 2017-10-12 LAB — URINE CULTURE: CULTURE: NO GROWTH

## 2017-10-12 MED ORDER — POTASSIUM CHLORIDE CRYS ER 20 MEQ PO TBCR
60.0000 meq | EXTENDED_RELEASE_TABLET | Freq: Once | ORAL | Status: AC
  Administered 2017-10-12: 60 meq via ORAL
  Filled 2017-10-12: qty 3

## 2017-10-12 MED ORDER — MAGNESIUM SULFATE 2 GM/50ML IV SOLN
2.0000 g | Freq: Once | INTRAVENOUS | Status: AC
  Administered 2017-10-12: 2 g via INTRAVENOUS
  Filled 2017-10-12: qty 50

## 2017-10-12 MED ORDER — CLINDAMYCIN HCL 150 MG PO CAPS
300.0000 mg | ORAL_CAPSULE | Freq: Three times a day (TID) | ORAL | Status: DC
  Administered 2017-10-12: 300 mg via ORAL
  Filled 2017-10-12: qty 2
  Filled 2017-10-12: qty 1

## 2017-10-12 MED ORDER — CLINDAMYCIN HCL 300 MG PO CAPS: 300.0000 mg | ORAL_CAPSULE | Freq: Three times a day (TID) | ORAL | 0 refills | Status: DC

## 2017-10-12 NOTE — NC FL2 (Signed)
[place] LEVEL OF CARE SCREENING TOOL     IDENTIFICATION  Patient Name: Mindy Cortez Birthdate: Dec 30, 1934 Sex: female Admission Date (Current Location): 10/10/2017  [place] and [place] Number:  Engineering geologist and Address:  [facility], [address] [place], [place] [address]      Provider Number: [id number]  Attending Physician Name and Address:  Demetrios Loll, MD  Relative Name and Phone Number:       Current Level of Care: Hospital Recommended Level of Care: [facility] Prior Approval Number:    Date Approved/Denied:   PASRR Number:    Discharge Plan: Domiciliary (Rest home)    Current Diagnoses: Patient Active Problem List   Diagnosis Date Noted  . Sepsis ([place]) 10/10/2017  . Atherosclerosis of native arteries of the extremities with ulceration ([place]) 12/11/2016  . Acquired absence of right hip 04/08/2015  . Bilateral cataracts 04/08/2015  . CCF (congestive cardiac failure) ([place]) 04/08/2015  . Clinical depression 04/08/2015  . Glaucoma 04/08/2015  . BP (high blood pressure) 04/08/2015  . Neuropathy 04/08/2015  . Insomnia, persistent 03/16/2015  . Mixed Alzheimer's and vascular dementia 03/16/2015  . Atrial fibrillation with rapid ventricular response ([place]) 02/08/2015  . B12 deficiency 08/02/2014  . Leg pain, right 06/09/2014  . Leg numbness 06/09/2014  . Endomyocardial disease ([place]) 11/13/2006  . Cardiac failure left ([place]) 11/13/2006  . Lymphoma, non-Hodgkin's ([place]) 11/13/2006  . Infection and inflammatory reaction due to internal prosthetic device, implant, and graft 11/13/2006  . Infection of breast implant ([place]) 11/13/2006  . Lymphoma of extranodal and solid organ sites ([place]) 11/13/2006  . Cardiomyopathy ([place]) 11/13/2006    Orientation RESPIRATION BLADDER Height & Weight     Self, Place, Situation  Normal Continent Weight: 142 lb 13.7 oz (64.8 kg) Height:  5\' 5"  (165.1 cm)  BEHAVIORAL SYMPTOMS/MOOD  NEUROLOGICAL BOWEL NUTRITION STATUS      Continent Diet(Heart healthy)  AMBULATORY STATUS COMMUNICATION OF NEEDS Skin   Total Care Verbally Normal                       Personal Care Assistance Level of Assistance  Bathing, Feeding, Dressing Bathing Assistance: Limited assistance Feeding assistance: Independent Dressing Assistance: Limited assistance     Functional Limitations Info             SPECIAL CARE FACTORS FREQUENCY                       Contractures Contractures Info: Not present    Additional Factors Info  Code Status, Allergies, Psychotropic Code Status Info: Full Allergies Info: Penicillin V Potassium, Prochlorperazine, Ramipril, Rifampin, Sulfa Antibiotics, Cephalexin Psychotropic Info: Cymbalta         Current Medications (10/12/2017):  This is the current hospital active medication list Current Facility-Administered Medications  Medication Dose Route Frequency Provider Last Rate Last Dose  . acetaminophen (TYLENOL) tablet 650 mg  650 mg Oral Q6H PRN Demetrios Loll, MD       Or  . acetaminophen (TYLENOL) suppository 650 mg  650 mg Rectal Q6H PRN Demetrios Loll, MD      . albuterol (PROVENTIL) (2.5 MG/3ML) 0.083% nebulizer solution 2.5 mg  2.5 mg Nebulization Q2H PRN Demetrios Loll, MD      . apixaban Arne Cleveland) tablet 5 mg  5 mg Oral BID Demetrios Loll, MD   5 mg at 10/12/17 0954  . aspirin EC tablet 81 mg  81 mg Oral  Daily Demetrios Loll, MD   81 mg at 10/12/17 7371  . Chlorhexidine Gluconate Cloth 2 % PADS 6 each  6 each Topical Q0600 Bettey Costa, MD   6 each at 10/11/17 0740  . clindamycin (CLEOCIN) capsule 300 mg  300 mg Oral Q8H Demetrios Loll, MD      . digoxin Fonnie Birkenhead) tablet 0.125 mg  0.125 mg Oral Daily Demetrios Loll, MD   0.125 mg at 10/12/17 0956  . donepezil (ARICEPT) tablet 10 mg  10 mg Oral QHS Demetrios Loll, MD   10 mg at 10/11/17 2059  . DULoxetine (CYMBALTA) DR capsule 30 mg  30 mg Oral Daily Demetrios Loll, MD   30 mg at 10/12/17 0626  . DULoxetine (CYMBALTA)  DR capsule 60 mg  60 mg Oral Daily Demetrios Loll, MD   60 mg at 10/12/17 9485  . HYDROcodone-acetaminophen (NORCO/VICODIN) 5-325 MG per tablet 1-2 tablet  1-2 tablet Oral Q4H PRN Demetrios Loll, MD   2 tablet at 10/12/17 0454  . loperamide (IMODIUM) capsule 2 mg  2 mg Oral Q6H PRN Demetrios Loll, MD      . magnesium sulfate IVPB 2 g 50 mL  2 g Intravenous Once Demetrios Loll, MD      . mupirocin ointment (BACTROBAN) 2 % 1 application  1 application Nasal BID Bettey Costa, MD   1 application at 46/27/03 0955  . ondansetron (ZOFRAN) tablet 4 mg  4 mg Oral Q6H PRN Demetrios Loll, MD       Or  . ondansetron [facility]) injection 4 mg  4 mg Intravenous Q6H PRN Demetrios Loll, MD      . senna-docusate (Senokot-S) tablet 1 tablet  1 tablet Oral QHS PRN Demetrios Loll, MD         Discharge Medications: Medication List     STOP taking these medications   losartan 25 MG tablet Commonly known as:  COZAAR   metoprolol tartrate 25 MG tablet Commonly known as:  LOPRESSOR     TAKE these medications   acetaminophen 325 MG tablet Commonly known as:  TYLENOL Take 650 mg by mouth every 6 (six) hours as needed.   aspirin EC 81 MG tablet Take 1 tablet (81 mg total) by mouth daily.   atorvastatin 20 MG tablet Commonly known as:  LIPITOR Take 1 tablet (20 mg total) by mouth daily.   cholecalciferol 1000 units tablet Commonly known as:  VITAMIN D Take 1,000 Units by mouth daily.   clindamycin 300 MG capsule Commonly known as:  CLEOCIN Take 1 capsule (300 mg total) by mouth every 8 (eight) hours.   digoxin 0.125 MG tablet Commonly known as:  LANOXIN Take 0.125 mg by mouth daily.   donepezil 10 MG tablet Commonly known as:  ARICEPT Take 10 mg by mouth at bedtime.   DULoxetine 30 MG capsule Commonly known as:  CYMBALTA Take 30 mg by mouth daily.   DULoxetine 60 MG capsule Commonly known as:  CYMBALTA Take 60 mg by mouth daily.   ELIQUIS 5 MG Tabs tablet Generic drug:  apixaban Take 5 mg by mouth 2  (two) times daily.   furosemide 20 MG tablet Commonly known as:  LASIX Take 20 mg by mouth daily as needed for edema.   loperamide 2 MG capsule Commonly known as:  IMODIUM Take 2 mg by mouth every 6 (six) hours as needed for diarrhea or loose stools.   multivitamin with minerals Tabs tablet Take 1 tablet by mouth daily.   vitamin B-12  1000 MCG tablet Commonly known as:  CYANOCOBALAMIN Take 1,000 mcg by mouth daily.     Relevant Imaging Results:  Relevant Lab Results:   Additional Information    Zettie Pho, LCSW

## 2017-10-12 NOTE — Discharge Instructions (Signed)
HHPT °Fall precaution. °

## 2017-10-12 NOTE — Discharge Summary (Signed)
[place] [facility] [place] NAME: Mindy Cortez    MR#:  [id number]  DATE OF BIRTH:  1935-02-25  DATE OF ADMISSION:  10/10/2017   ADMITTING PHYSICIAN: Demetrios Loll, MD  DATE OF DISCHARGE: 10/12/2017  PRIMARY CARE PHYSICIAN: Marinda Elk, MD   ADMISSION DIAGNOSIS:  Sepsis, due to unspecified organism [facility]) [A41.9] DISCHARGE DIAGNOSIS:  Active Problems:   Sepsis ([place])  SECONDARY DIAGNOSIS:   Past Medical History:  Diagnosis Date  . Depression   . 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 HTN (hypertension)   . Hypertension   . Neuropathy   . Nocturia   . Non-Hodgkin lymphoma ([place])    Bone Marrow Transplant with chemo + Rad tx's.  . Tachycardia   . Urge incontinence   . Urinary incontinence    HOSPITAL COURSE:   82 year old female with remote history of non-Hodgkin's lymphoma, depression and some cognitive impairment/dementia who presents from facility with confusion.  1. Acute encephalopathy due to sepsis on top of cognitive impairment/dementia back to baseline.  2. Sepsis: Patient presents with fever, tachycardia and leukocytosis  UA did not show evidence of UTI. Chest x-ray did not show evidence of pneumonia. Blood cultures are negative thus far. Right foot second toe does appear red She has been on aztreonam and vancomycin. Change to clindamycin po. Per Dr. Elvina Mattes, no evidence of fracture to the 5th metatarsal base, wound care with dressing to 2nd toe.  3. Generalized weakness: Physical therapy consultation: SNF. But family want her back to ALF with HHPT.  4. Dementia: Continue donepezil  5. Depression: Continue Cymbalta 6. PAF: Continue digoxin and Eliquis No Metoprolol due to low BP  7. Hyponatremia: Follow sodium level  8. Chronic systolic heart failure ejection fraction 25%:  Stable.  Hold loperssor and losartan due to low side BP.  Hypokalemia and  hypomagnesemia. Given supplement and f/u level as outpatient.  DISCHARGE CONDITIONS:  Stable, discharge to ALF with HHPT today. CONSULTS OBTAINED:  Treatment Team:  Albertine Patricia, DPM DRUG ALLERGIES:   Allergies  Allergen Reactions  . Penicillin V Potassium Hives  . Prochlorperazine Nausea And Vomiting    DIZZINESS, DRUNK FEELING. DIZZINESS, DRUNK FEELING.  . Ramipril Other (See Comments)    SEVERE HEADACHE SEVERE HEADACHE  . Rifampin     Severe stomach irriatates cramping and nausea  . Sulfa Antibiotics     TOLD AS A CHILD, MADE PT NERVOUS, EMOTIONAL, TOLD NOT TO TAKE.  . Cephalexin Rash    RASH, ITCHING. RASH, ITCHING.   DISCHARGE MEDICATIONS:   Allergies as of 10/12/2017      Reactions   Penicillin V Potassium Hives   Prochlorperazine Nausea And Vomiting   DIZZINESS, DRUNK FEELING. DIZZINESS, DRUNK FEELING.   Ramipril Other (See Comments)   SEVERE HEADACHE SEVERE HEADACHE   Rifampin    Severe stomach irriatates cramping and nausea   Sulfa Antibiotics    TOLD AS A CHILD, MADE PT NERVOUS, EMOTIONAL, TOLD NOT TO TAKE.   Cephalexin Rash   RASH, ITCHING. RASH, ITCHING.      Medication List    STOP taking these medications   losartan 25 MG tablet Commonly known as:  COZAAR   metoprolol tartrate 25 MG tablet Commonly known as:  LOPRESSOR     TAKE these medications   acetaminophen 325 MG tablet Commonly known as:  TYLENOL Take 650 mg by mouth every 6 (six) hours as  needed.   aspirin EC 81 MG tablet Take 1 tablet (81 mg total) by mouth daily.   atorvastatin 20 MG tablet Commonly known as:  LIPITOR Take 1 tablet (20 mg total) by mouth daily.   cholecalciferol 1000 units tablet Commonly known as:  VITAMIN D Take 1,000 Units by mouth daily.   clindamycin 300 MG capsule Commonly known as:  CLEOCIN Take 1 capsule (300 mg total) by mouth every 8 (eight) hours.   digoxin 0.125 MG tablet Commonly known as:  LANOXIN Take 0.125 mg by mouth daily.     donepezil 10 MG tablet Commonly known as:  ARICEPT Take 10 mg by mouth at bedtime.   DULoxetine 30 MG capsule Commonly known as:  CYMBALTA Take 30 mg by mouth daily.   DULoxetine 60 MG capsule Commonly known as:  CYMBALTA Take 60 mg by mouth daily.   ELIQUIS 5 MG Tabs tablet Generic drug:  apixaban Take 5 mg by mouth 2 (two) times daily.   furosemide 20 MG tablet Commonly known as:  LASIX Take 20 mg by mouth daily as needed for edema.   loperamide 2 MG capsule Commonly known as:  IMODIUM Take 2 mg by mouth every 6 (six) hours as needed for diarrhea or loose stools.   multivitamin with minerals Tabs tablet Take 1 tablet by mouth daily.   vitamin B-12 1000 MCG tablet Commonly known as:  CYANOCOBALAMIN Take 1,000 mcg by mouth daily.        DISCHARGE INSTRUCTIONS:  See AVS. If you experience worsening of your admission symptoms, develop shortness of breath, life threatening emergency, suicidal or homicidal thoughts you must seek medical attention immediately by calling 911 or calling your MD immediately  if symptoms less severe.  You Must read complete instructions/literature along with all the possible adverse reactions/side effects for all the Medicines you take and that have been prescribed to you. Take any new Medicines after you have completely understood and accpet all the possible adverse reactions/side effects.   Please note  You were cared for by a hospitalist during your hospital stay. If you have any questions about your discharge medications or the care you received while you were in the hospital after you are discharged, you can call the unit and asked to speak with the hospitalist on call if the hospitalist that took care of you is not available. Once you are discharged, your primary care physician will handle any further medical issues. Please note that NO REFILLS for any discharge medications will be authorized once you are discharged, as it is imperative that  you return to your primary care physician (or establish a relationship with a primary care physician if you do not have one) for your aftercare needs so that they can reassess your need for medications and monitor your lab values.    On the day of Discharge:  VITAL SIGNS:  Blood pressure (!) 140/50, pulse 96, temperature 98.1 F (36.7 C), temperature source Oral, resp. rate 20, height 5\' 5"  (1.651 m), weight 142 lb 13.7 oz (64.8 kg), SpO2 100 %. PHYSICAL EXAMINATION:  GENERAL:  82 y.o.-year-old patient lying in the bed with no acute distress.  EYES: Pupils equal, round, reactive to light and accommodation. No scleral icterus. Extraocular muscles intact.  HEENT: Head atraumatic, normocephalic. Oropharynx and nasopharynx clear.  NECK:  Supple, no jugular venous distention. No thyroid enlargement, no tenderness.  LUNGS: Normal breath sounds bilaterally, no wheezing, rales,rhonchi or crepitation. No use of accessory muscles of respiration.  CARDIOVASCULAR: S1, S2 normal. No murmurs, rubs, or gallops.  ABDOMEN: Soft, non-tender, non-distended. Bowel sounds present. No organomegaly or mass.  EXTREMITIES: No pedal edema, cyanosis, or clubbing.  NEUROLOGIC:unable to exam. PSYCHIATRIC: The patient is demented. SKIN: No obvious rash, lesion, or ulcer.  DATA REVIEW:   CBC Recent Labs  Lab 10/12/17 0340  WBC 17.2*  HGB 10.0*  HCT 30.7*  PLT 248    Chemistries  Recent Labs  Lab 10/10/17 1419  10/12/17 0340  NA 134*   < > 136  K 3.9   < > 3.1*  CL 99*   < > 106  CO2 21*   < > 23  GLUCOSE 125*   < > 104*  BUN 20   < > 12  CREATININE 1.07*   < > 0.71  CALCIUM 9.0   < > 8.2*  MG  --   --  1.6*  AST 24  --   --   ALT 9*  --   --   ALKPHOS 137*  --   --   BILITOT 1.1  --   --    < > = values in this interval not displayed.     Microbiology Results  Results for orders placed or performed during the hospital encounter of 10/10/17  Urine culture     Status: None   Collection Time:  10/10/17  3:13 PM  Result Value Ref Range Status   Specimen Description   Final    URINE, RANDOM Performed at [facility], [address], [place], [place] [address]    Special Requests   Final    NONE Performed at [facility], [address], [place], [place] [address]    Culture   Final    NO GROWTH Performed at [facility], [place] [address], [place], [place] [address]    Report Status 10/12/2017 FINAL  Final  MRSA PCR Screening     Status: Abnormal   Collection Time: 10/10/17  9:33 PM  Result Value Ref Range Status   MRSA by PCR POSITIVE (A) NEGATIVE Final    Comment:        The GeneXpert MRSA Assay (FDA approved for NASAL specimens only), is one component of a comprehensive MRSA colonization surveillance program. It is not intended to diagnose MRSA infection nor to guide or monitor treatment for MRSA infections. RESULT CALLED TO, READ BACK BY AND VERIFIED WITH: DAWN SONGS AT 2337 10/10/17 ALV     RADIOLOGY:  Dg Hand 2 View Left  Result Date: 10/11/2017 CLINICAL DATA:  82 year old female with intermediate pain and swelling of the left hand. EXAM: LEFT HAND - 2 VIEW COMPARISON:  None. FINDINGS: There is no acute fracture or dislocation. The bones are osteopenic. There are arthritic changes of the interphalangeal joints as well as arthritic changes of the base of the thumb. The soft tissues appear unremarkable. IMPRESSION: No acute fracture or dislocation. Electronically Signed   By: Anner Crete M.D.   On: 10/11/2017 23:39     Management plans discussed with the patient, daughter-in-laws and they are in agreement.  CODE STATUS: Full Code   TOTAL TIME TAKING CARE OF THIS PATIENT: 45 minutes.    Demetrios Loll M.D on 10/12/2017 at 10:27 AM  Between 7am to 6pm - Pager - [phone]  After 6pm go to [email or web address] - Proofreader  [facility]  Office  [phone]  CC: Primary care physician; Marinda Elk, MD  Note: This dictation was prepared with Dragon dictation along with smaller phrase technology. Any transcriptional errors that result from this process are unintentional.

## 2017-10-12 NOTE — Progress Notes (Signed)
Trevor Iha  A and O x 4. VSS. Pt tolerating diet well. No complaints of pain or nausea. IV removed intact, prescriptions given daughter. Pt's daughter voiced understanding of discharge instructions with no further questions. Pt discharged via wheelchair with nurse and nurse tech. Pt discharger to [facility] via family transport.     Allergies as of 10/12/2017      Reactions   Penicillin V Potassium Hives   Prochlorperazine Nausea And Vomiting   DIZZINESS, DRUNK FEELING. DIZZINESS, DRUNK FEELING.   Ramipril Other (See Comments)   SEVERE HEADACHE SEVERE HEADACHE   Rifampin    Severe stomach irriatates cramping and nausea   Sulfa Antibiotics    TOLD AS A CHILD, MADE PT NERVOUS, EMOTIONAL, TOLD NOT TO TAKE.   Cephalexin Rash   RASH, ITCHING. RASH, ITCHING.      Medication List    STOP taking these medications   losartan 25 MG tablet Commonly known as:  COZAAR   metoprolol tartrate 25 MG tablet Commonly known as:  LOPRESSOR     TAKE these medications   acetaminophen 325 MG tablet Commonly known as:  TYLENOL Take 650 mg by mouth every 6 (six) hours as needed.   aspirin EC 81 MG tablet Take 1 tablet (81 mg total) by mouth daily.   atorvastatin 20 MG tablet Commonly known as:  LIPITOR Take 1 tablet (20 mg total) by mouth daily.   cholecalciferol 1000 units tablet Commonly known as:  VITAMIN D Take 1,000 Units by mouth daily.   clindamycin 300 MG capsule Commonly known as:  CLEOCIN Take 1 capsule (300 mg total) by mouth every 8 (eight) hours.   digoxin 0.125 MG tablet Commonly known as:  LANOXIN Take 0.125 mg by mouth daily.   donepezil 10 MG tablet Commonly known as:  ARICEPT Take 10 mg by mouth at bedtime.   DULoxetine 30 MG capsule Commonly known as:  CYMBALTA Take 30 mg by mouth daily.   DULoxetine 60 MG capsule Commonly known as:  CYMBALTA Take 60 mg by mouth daily.   ELIQUIS 5 MG Tabs tablet Generic drug:  apixaban Take 5 mg by mouth 2 (two) times  daily.   furosemide 20 MG tablet Commonly known as:  LASIX Take 20 mg by mouth daily as needed for edema.   loperamide 2 MG capsule Commonly known as:  IMODIUM Take 2 mg by mouth every 6 (six) hours as needed for diarrhea or loose stools.   multivitamin with minerals Tabs tablet Take 1 tablet by mouth daily.   vitamin B-12 1000 MCG tablet Commonly known as:  CYANOCOBALAMIN Take 1,000 mcg by mouth daily.       Vitals:   10/12/17 0509 10/12/17 0955  BP: (!) 128/51 (!) 140/50  Pulse: 100 96  Resp: 20   Temp: 98.1 F (36.7 C)   SpO2: 98% 100%    Francesco Sor

## 2017-10-12 NOTE — Clinical Social Work Note (Signed)
Patient will discharge to [facility] via family transport. The patient's facility representative, [facility], is aware and in agreement. CSW will deliver packet to the chart when able which will need to be taken to the facility. CSW will continue to follow pending additional discharge needs.  Santiago Bumpers, MSW, Latanya Presser [phone]

## 2017-10-12 NOTE — NC FL2 (Deleted)
[place] LEVEL OF CARE SCREENING TOOL     IDENTIFICATION  Patient Name: Mindy Cortez Birthdate: 1935/04/29 Sex: female Admission Date (Current Location): 10/10/2017  [facility] and [place] Number:      Facility and Address:         Provider Number:    Attending Physician Name and Address:  Demetrios Loll, MD  Relative Name and Phone Number:       Current Level of Care:   Recommended Level of Care:   Prior Approval Number:    Date Approved/Denied:   PASRR Number:    Discharge Plan:      Current Diagnoses: Patient Active Problem List   Diagnosis Date Noted  . Sepsis ([place]) 10/10/2017  . Atherosclerosis of native arteries of the extremities with ulceration ([place]) 12/11/2016  . Acquired absence of right hip 04/08/2015  . Bilateral cataracts 04/08/2015  . CCF (congestive cardiac failure) ([place]) 04/08/2015  . Clinical depression 04/08/2015  . Glaucoma 04/08/2015  . BP (high blood pressure) 04/08/2015  . Neuropathy 04/08/2015  . Insomnia, persistent 03/16/2015  . Mixed Alzheimer's and vascular dementia 03/16/2015  . Atrial fibrillation with rapid ventricular response ([place]) 02/08/2015  . B12 deficiency 08/02/2014  . Leg pain, right 06/09/2014  . Leg numbness 06/09/2014  . Endomyocardial disease ([place]) 11/13/2006  . Cardiac failure left ([place]) 11/13/2006  . Lymphoma, non-Hodgkin's ([place]) 11/13/2006  . Infection and inflammatory reaction due to internal prosthetic device, implant, and graft 11/13/2006  . Infection of breast implant ([place]) 11/13/2006  . Lymphoma of extranodal and solid organ sites ([place]) 11/13/2006  . Cardiomyopathy ([place]) 11/13/2006    Orientation RESPIRATION BLADDER Height & Weight            Weight: 142 lb 13.7 oz (64.8 kg) Height:  5\' 5"  (165.1 cm)  BEHAVIORAL SYMPTOMS/MOOD NEUROLOGICAL BOWEL NUTRITION STATUS           AMBULATORY STATUS COMMUNICATION OF NEEDS Skin                               Personal Care Assistance Level of  Assistance              Functional Limitations Info             SPECIAL CARE FACTORS FREQUENCY                       Contractures      Additional Factors Info                  Current Medications (10/12/2017):  This is the current hospital active medication list Current Facility-Administered Medications  Medication Dose Route Frequency Provider Last Rate Last Dose  . acetaminophen (TYLENOL) tablet 650 mg  650 mg Oral Q6H PRN Demetrios Loll, MD       Or  . acetaminophen (TYLENOL) suppository 650 mg  650 mg Rectal Q6H PRN Demetrios Loll, MD      . albuterol (PROVENTIL) (2.5 MG/3ML) 0.083% nebulizer solution 2.5 mg  2.5 mg Nebulization Q2H PRN Demetrios Loll, MD      . apixaban Arne [place]) tablet 5 mg  5 mg Oral BID Demetrios Loll, MD   5 mg at 10/12/17 0954  . aspirin EC tablet 81 mg  81 mg Oral Daily Demetrios Loll, MD   81 mg at 10/12/17 9924  . Chlorhexidine Gluconate Cloth 2 % PADS 6 each  6 each Topical Q0600  Bettey Costa, MD   6 each at 10/11/17 0740  . clindamycin (CLEOCIN) capsule 300 mg  300 mg Oral Q8H Demetrios Loll, MD      . digoxin Fonnie Birkenhead) tablet 0.125 mg  0.125 mg Oral Daily Demetrios Loll, MD   0.125 mg at 10/12/17 0956  . donepezil (ARICEPT) tablet 10 mg  10 mg Oral QHS Demetrios Loll, MD   10 mg at 10/11/17 2059  . DULoxetine (CYMBALTA) DR capsule 30 mg  30 mg Oral Daily Demetrios Loll, MD   30 mg at 10/12/17 8099  . DULoxetine (CYMBALTA) DR capsule 60 mg  60 mg Oral Daily Demetrios Loll, MD   60 mg at 10/12/17 8338  . HYDROcodone-acetaminophen (NORCO/VICODIN) 5-325 MG per tablet 1-2 tablet  1-2 tablet Oral Q4H PRN Demetrios Loll, MD   2 tablet at 10/12/17 0454  . loperamide (IMODIUM) capsule 2 mg  2 mg Oral Q6H PRN Demetrios Loll, MD      . magnesium sulfate IVPB 2 g 50 mL  2 g Intravenous Once Demetrios Loll, MD      . mupirocin ointment (BACTROBAN) 2 % 1 application  1 application Nasal BID Bettey Costa, MD   1 application at 25/05/39 0955  . ondansetron (ZOFRAN) tablet 4 mg  4 mg Oral Q6H PRN  Demetrios Loll, MD       Or  . ondansetron [facility]) injection 4 mg  4 mg Intravenous Q6H PRN Demetrios Loll, MD      . senna-docusate (Senokot-S) tablet 1 tablet  1 tablet Oral QHS PRN Demetrios Loll, MD         Discharge Medications: Medication List     STOP taking these medications   losartan 25 MG tablet Commonly known as:  COZAAR   metoprolol tartrate 25 MG tablet Commonly known as:  LOPRESSOR     TAKE these medications   acetaminophen 325 MG tablet Commonly known as:  TYLENOL Take 650 mg by mouth every 6 (six) hours as needed.   aspirin EC 81 MG tablet Take 1 tablet (81 mg total) by mouth daily.   atorvastatin 20 MG tablet Commonly known as:  LIPITOR Take 1 tablet (20 mg total) by mouth daily.   cholecalciferol 1000 units tablet Commonly known as:  VITAMIN D Take 1,000 Units by mouth daily.   clindamycin 300 MG capsule Commonly known as:  CLEOCIN Take 1 capsule (300 mg total) by mouth every 8 (eight) hours.   digoxin 0.125 MG tablet Commonly known as:  LANOXIN Take 0.125 mg by mouth daily.   donepezil 10 MG tablet Commonly known as:  ARICEPT Take 10 mg by mouth at bedtime.   DULoxetine 30 MG capsule Commonly known as:  CYMBALTA Take 30 mg by mouth daily.   DULoxetine 60 MG capsule Commonly known as:  CYMBALTA Take 60 mg by mouth daily.   ELIQUIS 5 MG Tabs tablet Generic drug:  apixaban Take 5 mg by mouth 2 (two) times daily.   furosemide 20 MG tablet Commonly known as:  LASIX Take 20 mg by mouth daily as needed for edema.   loperamide 2 MG capsule Commonly known as:  IMODIUM Take 2 mg by mouth every 6 (six) hours as needed for diarrhea or loose stools.   multivitamin with minerals Tabs tablet Take 1 tablet by mouth daily.   vitamin B-12 1000 MCG tablet Commonly known as:  CYANOCOBALAMIN Take 1,000 mcg by mouth daily.          Relevant Imaging Results:  Relevant Lab Results:   Additional Information    Zettie Pho,  LCSW

## 2017-10-15 ENCOUNTER — Emergency Department: Payer: Medicare Other

## 2017-10-15 ENCOUNTER — Other Ambulatory Visit: Payer: Self-pay

## 2017-10-15 ENCOUNTER — Inpatient Hospital Stay
Admission: EM | Admit: 2017-10-15 | Discharge: 2017-10-17 | DRG: 871 | Disposition: A | Discharge status: Other Acute Inpatient Hospital | Payer: Medicare Other | Attending: Internal Medicine | Admitting: Internal Medicine

## 2017-10-15 ENCOUNTER — Encounter: Payer: Self-pay | Admitting: Emergency Medicine

## 2017-10-15 DIAGNOSIS — I5022 Chronic systolic (congestive) heart failure: Secondary | ICD-10-CM | POA: Diagnosis present

## 2017-10-15 DIAGNOSIS — Z8249 Family history of ischemic heart disease and other diseases of the circulatory system: Secondary | ICD-10-CM | POA: Diagnosis not present

## 2017-10-15 DIAGNOSIS — Z79899 Other long term (current) drug therapy: Secondary | ICD-10-CM

## 2017-10-15 DIAGNOSIS — I2510 Atherosclerotic heart disease of native coronary artery without angina pectoris: Secondary | ICD-10-CM | POA: Diagnosis present

## 2017-10-15 DIAGNOSIS — Z8572 Personal history of non-Hodgkin lymphomas: Secondary | ICD-10-CM | POA: Diagnosis not present

## 2017-10-15 DIAGNOSIS — M21851 Other specified acquired deformities of right thigh: Secondary | ICD-10-CM | POA: Diagnosis present

## 2017-10-15 DIAGNOSIS — J9690 Respiratory failure, unspecified, unspecified whether with hypoxia or hypercapnia: Secondary | ICD-10-CM

## 2017-10-15 DIAGNOSIS — F329 Major depressive disorder, single episode, unspecified: Secondary | ICD-10-CM | POA: Diagnosis present

## 2017-10-15 DIAGNOSIS — N3941 Urge incontinence: Secondary | ICD-10-CM | POA: Diagnosis present

## 2017-10-15 DIAGNOSIS — G9341 Metabolic encephalopathy: Secondary | ICD-10-CM | POA: Diagnosis present

## 2017-10-15 DIAGNOSIS — I252 Old myocardial infarction: Secondary | ICD-10-CM | POA: Diagnosis not present

## 2017-10-15 DIAGNOSIS — Z803 Family history of malignant neoplasm of breast: Secondary | ICD-10-CM

## 2017-10-15 DIAGNOSIS — Z7982 Long term (current) use of aspirin: Secondary | ICD-10-CM

## 2017-10-15 DIAGNOSIS — L97511 Non-pressure chronic ulcer of other part of right foot limited to breakdown of skin: Secondary | ICD-10-CM | POA: Diagnosis present

## 2017-10-15 DIAGNOSIS — Z882 Allergy status to sulfonamides status: Secondary | ICD-10-CM | POA: Diagnosis not present

## 2017-10-15 DIAGNOSIS — A4102 Sepsis due to Methicillin resistant Staphylococcus aureus: Secondary | ICD-10-CM | POA: Diagnosis not present

## 2017-10-15 DIAGNOSIS — Z95810 Presence of automatic (implantable) cardiac defibrillator: Secondary | ICD-10-CM

## 2017-10-15 DIAGNOSIS — Z792 Long term (current) use of antibiotics: Secondary | ICD-10-CM

## 2017-10-15 DIAGNOSIS — B9562 Methicillin resistant Staphylococcus aureus infection as the cause of diseases classified elsewhere: Secondary | ICD-10-CM

## 2017-10-15 DIAGNOSIS — A419 Sepsis, unspecified organism: Secondary | ICD-10-CM | POA: Diagnosis not present

## 2017-10-15 DIAGNOSIS — R6521 Severe sepsis with septic shock: Secondary | ICD-10-CM | POA: Diagnosis present

## 2017-10-15 DIAGNOSIS — F0390 Unspecified dementia without behavioral disturbance: Secondary | ICD-10-CM | POA: Diagnosis present

## 2017-10-15 DIAGNOSIS — Z808 Family history of malignant neoplasm of other organs or systems: Secondary | ICD-10-CM

## 2017-10-15 DIAGNOSIS — R6520 Severe sepsis without septic shock: Secondary | ICD-10-CM | POA: Diagnosis not present

## 2017-10-15 DIAGNOSIS — R000 Tachycardia, unspecified: Secondary | ICD-10-CM | POA: Diagnosis present

## 2017-10-15 DIAGNOSIS — I110 Hypertensive heart disease with heart failure: Secondary | ICD-10-CM | POA: Diagnosis present

## 2017-10-15 DIAGNOSIS — L8990 Pressure ulcer of unspecified site, unspecified stage: Secondary | ICD-10-CM

## 2017-10-15 DIAGNOSIS — R1031 Right lower quadrant pain: Secondary | ICD-10-CM | POA: Diagnosis present

## 2017-10-15 DIAGNOSIS — Z9481 Bone marrow transplant status: Secondary | ICD-10-CM | POA: Diagnosis not present

## 2017-10-15 DIAGNOSIS — G629 Polyneuropathy, unspecified: Secondary | ICD-10-CM | POA: Diagnosis present

## 2017-10-15 DIAGNOSIS — E872 Acidosis: Secondary | ICD-10-CM | POA: Diagnosis present

## 2017-10-15 DIAGNOSIS — Z888 Allergy status to other drugs, medicaments and biological substances status: Secondary | ICD-10-CM

## 2017-10-15 DIAGNOSIS — Z7901 Long term (current) use of anticoagulants: Secondary | ICD-10-CM

## 2017-10-15 DIAGNOSIS — R52 Pain, unspecified: Secondary | ICD-10-CM

## 2017-10-15 DIAGNOSIS — I480 Paroxysmal atrial fibrillation: Secondary | ICD-10-CM | POA: Diagnosis present

## 2017-10-15 DIAGNOSIS — R7881 Bacteremia: Secondary | ICD-10-CM

## 2017-10-15 DIAGNOSIS — Z880 Allergy status to penicillin: Secondary | ICD-10-CM | POA: Diagnosis not present

## 2017-10-15 DIAGNOSIS — M25551 Pain in right hip: Secondary | ICD-10-CM | POA: Diagnosis present

## 2017-10-15 HISTORY — DX: Unspecified atrial fibrillation: I48.91

## 2017-10-15 HISTORY — DX: Heart failure, unspecified: I50.9

## 2017-10-15 HISTORY — DX: Presence of automatic (implantable) cardiac defibrillator: Z95.810

## 2017-10-15 LAB — URINALYSIS, COMPLETE (UACMP) WITH MICROSCOPIC
Bilirubin Urine: NEGATIVE
Glucose, UA: NEGATIVE mg/dL
KETONES UR: 5 mg/dL — AB
Leukocytes, UA: NEGATIVE
Nitrite: NEGATIVE
PROTEIN: 100 mg/dL — AB
Specific Gravity, Urine: 1.016 (ref 1.005–1.030)
pH: 5 (ref 5.0–8.0)

## 2017-10-15 LAB — COMPREHENSIVE METABOLIC PANEL
ALT: 13 U/L — AB (ref 14–54)
ALT: 13 U/L — ABNORMAL LOW (ref 14–54)
ANION GAP: 9 (ref 5–15)
AST: 28 U/L (ref 15–41)
AST: 39 U/L (ref 15–41)
Albumin: 2.2 g/dL — ABNORMAL LOW (ref 3.5–5.0)
Albumin: 2 g/dL — ABNORMAL LOW (ref 3.5–5.0)
Alkaline Phosphatase: 122 U/L (ref 38–126)
Alkaline Phosphatase: 120 U/L (ref 38–126)
Anion gap: 11 (ref 5–15)
BILIRUBIN TOTAL: 1.2 mg/dL (ref 0.3–1.2)
BUN: 18 mg/dL (ref 6–20)
BUN: 18 mg/dL (ref 6–20)
CHLORIDE: 106 mmol/L (ref 101–111)
CHLORIDE: 106 mmol/L (ref 101–111)
CO2: 20 mmol/L — AB (ref 22–32)
CO2: 20 mmol/L — ABNORMAL LOW (ref 22–32)
CREATININE: 0.93 mg/dL (ref 0.44–1.00)
Calcium: 8.2 mg/dL — ABNORMAL LOW (ref 8.9–10.3)
Calcium: 7.4 mg/dL — ABNORMAL LOW (ref 8.9–10.3)
Creatinine, Ser: 1.02 mg/dL — ABNORMAL HIGH (ref 0.44–1.00)
GFR calc Af Amer: 59 mL/min — ABNORMAL LOW (ref >60)
GFR calc non Af Amer: 57 mL/min — ABNORMAL LOW (ref >60)
GFR, EST NON AFRICAN AMERICAN: 51 mL/min — AB (ref >60)
Glucose, Bld: 138 mg/dL — ABNORMAL HIGH (ref 65–99)
Glucose, Bld: 231 mg/dL — ABNORMAL HIGH (ref 65–99)
POTASSIUM: 3.7 mmol/L (ref 3.5–5.1)
POTASSIUM: 3.5 mmol/L (ref 3.5–5.1)
SODIUM: 137 mmol/L (ref 135–145)
Sodium: 135 mmol/L (ref 135–145)
TOTAL PROTEIN: 6.4 g/dL — AB (ref 6.5–8.1)
Total Bilirubin: 1.1 mg/dL (ref 0.3–1.2)
Total Protein: 7.3 g/dL (ref 6.5–8.1)

## 2017-10-15 LAB — CBC WITH DIFFERENTIAL/PLATELET
BASOS ABS: 0 10*3/uL (ref 0–0.1)
BASOS PCT: 0 %
Basophils Absolute: 0.1 10*3/uL (ref 0–0.1)
Basophils Relative: 0 %
EOS ABS: 0 10*3/uL (ref 0–0.7)
EOS PCT: 0 %
Eosinophils Absolute: 0 10*3/uL (ref 0–0.7)
Eosinophils Relative: 0 %
HEMATOCRIT: 31.3 % — AB (ref 35.0–47.0)
HEMATOCRIT: 29.2 % — AB (ref 35.0–47.0)
HEMOGLOBIN: 10.3 g/dL — AB (ref 12.0–16.0)
Hemoglobin: 9.4 g/dL — ABNORMAL LOW (ref 12.0–16.0)
LYMPHS ABS: 0.3 10*3/uL — AB (ref 1.0–3.6)
LYMPHS PCT: 1 %
LYMPHS PCT: 2 %
Lymphs Abs: 0.4 10*3/uL — ABNORMAL LOW (ref 1.0–3.6)
MCH: 27.9 pg (ref 26.0–34.0)
MCH: 27.5 pg (ref 26.0–34.0)
MCHC: 33 g/dL (ref 32.0–36.0)
MCHC: 32.3 g/dL (ref 32.0–36.0)
MCV: 84.7 fL (ref 80.0–100.0)
MCV: 85.3 fL (ref 80.0–100.0)
MONO ABS: 0.7 10*3/uL (ref 0.2–0.9)
MONOS PCT: 6 %
MONOS PCT: 4 %
Monocytes Absolute: 1.7 10*3/uL — ABNORMAL HIGH (ref 0.2–0.9)
NEUTROS PCT: 93 %
Neutro Abs: 24.2 10*3/uL — ABNORMAL HIGH (ref 1.4–6.5)
Neutro Abs: 18.2 10*3/uL — ABNORMAL HIGH (ref 1.4–6.5)
Neutrophils Relative %: 94 %
PLATELETS: 178 10*3/uL (ref 150–440)
Platelets: 232 10*3/uL (ref 150–440)
RBC: 3.7 MIL/uL — AB (ref 3.80–5.20)
RBC: 3.42 MIL/uL — ABNORMAL LOW (ref 3.80–5.20)
RDW: 15.3 % — ABNORMAL HIGH (ref 11.5–14.5)
RDW: 15.8 % — AB (ref 11.5–14.5)
WBC: 26.3 10*3/uL — ABNORMAL HIGH (ref 3.6–11.0)
WBC: 19.4 10*3/uL — ABNORMAL HIGH (ref 3.6–11.0)

## 2017-10-15 LAB — PROTIME-INR
INR: 1.72
PROTHROMBIN TIME: 20 s — AB (ref 11.4–15.2)

## 2017-10-15 LAB — LACTIC ACID, PLASMA
LACTIC ACID, VENOUS: 2.1 mmol/L — AB (ref 0.5–1.9)
LACTIC ACID, VENOUS: 2.4 mmol/L — AB (ref 0.5–1.9)
LACTIC ACID, VENOUS: 3.9 mmol/L — AB (ref 0.5–1.9)
LACTIC ACID, VENOUS: 4.7 mmol/L — AB (ref 0.5–1.9)

## 2017-10-15 LAB — PROCALCITONIN
PROCALCITONIN: 3.98 ng/mL
PROCALCITONIN: 4.5 ng/mL

## 2017-10-15 LAB — APTT: aPTT: 42 seconds — ABNORMAL HIGH (ref 24–36)

## 2017-10-15 LAB — DIGOXIN LEVEL: Digoxin Level: 0.6 ng/mL — ABNORMAL LOW (ref 0.8–2.0)

## 2017-10-15 MED ORDER — SODIUM CHLORIDE 0.9 % IV SOLN
1000.0000 mL | Freq: Once | INTRAVENOUS | Status: AC
  Administered 2017-10-15: 1000 mL via INTRAVENOUS

## 2017-10-15 MED ORDER — MELATONIN 5 MG PO TABS
5.0000 mg | ORAL_TABLET | Freq: Every day | ORAL | Status: DC
  Administered 2017-10-15 – 2017-10-16 (×2): 5 mg via ORAL
  Filled 2017-10-15 (×3): qty 1

## 2017-10-15 MED ORDER — APIXABAN 5 MG PO TABS
5.0000 mg | ORAL_TABLET | Freq: Two times a day (BID) | ORAL | Status: DC
  Administered 2017-10-15 – 2017-10-17 (×4): 5 mg via ORAL
  Filled 2017-10-15 (×4): qty 1

## 2017-10-15 MED ORDER — DULOXETINE HCL 30 MG PO CPEP
90.0000 mg | ORAL_CAPSULE | Freq: Every day | ORAL | Status: DC
  Administered 2017-10-15 – 2017-10-17 (×3): 90 mg via ORAL
  Filled 2017-10-15 (×3): qty 3

## 2017-10-15 MED ORDER — SODIUM CHLORIDE 0.9 % IV SOLN
INTRAVENOUS | Status: DC
  Administered 2017-10-15 – 2017-10-16 (×2): via INTRAVENOUS

## 2017-10-15 MED ORDER — ONDANSETRON HCL 4 MG/2ML IJ SOLN: 4.0000 mg | Freq: Four times a day (QID) | INTRAMUSCULAR | Status: DC | PRN

## 2017-10-15 MED ORDER — VANCOMYCIN HCL 500 MG IV SOLR
500.0000 mg | Freq: Two times a day (BID) | INTRAVENOUS | Status: DC
  Administered 2017-10-15: 500 mg via INTRAVENOUS
  Filled 2017-10-15 (×5): qty 500

## 2017-10-15 MED ORDER — LEVOFLOXACIN IN D5W 750 MG/150ML IV SOLN: 750.0000 mg | Freq: Once | INTRAVENOUS | Status: DC

## 2017-10-15 MED ORDER — PIPERACILLIN-TAZOBACTAM 3.375 G IVPB 30 MIN
3.3750 g | Freq: Once | INTRAVENOUS | Status: AC
  Administered 2017-10-15: 3.375 g via INTRAVENOUS
  Filled 2017-10-15: qty 50

## 2017-10-15 MED ORDER — VANCOMYCIN HCL IN DEXTROSE 1-5 GM/200ML-% IV SOLN
1000.0000 mg | Freq: Once | INTRAVENOUS | Status: AC
  Administered 2017-10-15: 1000 mg via INTRAVENOUS
  Filled 2017-10-15: qty 200

## 2017-10-15 MED ORDER — VITAMIN B-12 1000 MCG PO TABS
1000.0000 ug | ORAL_TABLET | Freq: Every day | ORAL | Status: DC
  Administered 2017-10-16 – 2017-10-17 (×2): 1000 ug via ORAL
  Filled 2017-10-15 (×2): qty 1

## 2017-10-15 MED ORDER — LEVOFLOXACIN IN D5W 750 MG/150ML IV SOLN
750.0000 mg | INTRAVENOUS | Status: DC
  Administered 2017-10-15: 20:00:00 750 mg via INTRAVENOUS
  Filled 2017-10-15: qty 150

## 2017-10-15 MED ORDER — ATORVASTATIN CALCIUM 20 MG PO TABS
20.0000 mg | ORAL_TABLET | Freq: Every day | ORAL | Status: DC
  Administered 2017-10-16: 20 mg via ORAL
  Filled 2017-10-15 (×2): qty 1

## 2017-10-15 MED ORDER — DIGOXIN 125 MCG PO TABS
0.1250 mg | ORAL_TABLET | Freq: Every day | ORAL | Status: DC
  Administered 2017-10-15 – 2017-10-17 (×3): 0.125 mg via ORAL
  Filled 2017-10-15 (×4): qty 1

## 2017-10-15 MED ORDER — VANCOMYCIN HCL IN DEXTROSE 1-5 GM/200ML-% IV SOLN: 1000.0000 mg | Freq: Once | INTRAVENOUS | Status: DC

## 2017-10-15 MED ORDER — ADULT MULTIVITAMIN W/MINERALS CH
1.0000 | ORAL_TABLET | Freq: Every day | ORAL | Status: DC
  Administered 2017-10-16 – 2017-10-17 (×2): 1 via ORAL
  Filled 2017-10-15 (×2): qty 1

## 2017-10-15 MED ORDER — DEXTROSE 5 % IV SOLN: 2.0000 g | Freq: Once | INTRAVENOUS | Status: DC

## 2017-10-15 MED ORDER — DULOXETINE HCL 30 MG PO CPEP: 30.0000 mg | ORAL_CAPSULE | Freq: Every day | ORAL | Status: DC

## 2017-10-15 MED ORDER — ASPIRIN EC 81 MG PO TBEC
81.0000 mg | DELAYED_RELEASE_TABLET | Freq: Every day | ORAL | Status: DC
  Administered 2017-10-16 – 2017-10-17 (×2): 81 mg via ORAL
  Filled 2017-10-15 (×2): qty 1

## 2017-10-15 MED ORDER — VITAMIN D3 25 MCG (1000 UNIT) PO TABS
1000.0000 [IU] | ORAL_TABLET | Freq: Every day | ORAL | Status: DC
  Administered 2017-10-16 – 2017-10-17 (×2): 1000 [IU] via ORAL
  Filled 2017-10-15 (×4): qty 1

## 2017-10-15 MED ORDER — DEXTROSE 5 % IV SOLN
1.0000 g | Freq: Three times a day (TID) | INTRAVENOUS | Status: DC
  Administered 2017-10-15: 1 g via INTRAVENOUS
  Filled 2017-10-15 (×3): qty 1

## 2017-10-15 MED ORDER — DONEPEZIL HCL 5 MG PO TABS
10.0000 mg | ORAL_TABLET | Freq: Every day | ORAL | Status: DC
  Administered 2017-10-15 – 2017-10-16 (×2): 10 mg via ORAL
  Filled 2017-10-15: qty 1
  Filled 2017-10-15 (×2): qty 2

## 2017-10-15 MED ORDER — ACETAMINOPHEN 325 MG PO TABS
650.0000 mg | ORAL_TABLET | Freq: Four times a day (QID) | ORAL | Status: DC | PRN
  Administered 2017-10-15 – 2017-10-17 (×3): 650 mg via ORAL
  Filled 2017-10-15 (×3): qty 2

## 2017-10-15 NOTE — Progress Notes (Signed)
CRITICAL VALUE STICKER  CRITICAL VALUE: Lactic acid 3.4  RECEIVER (on-site recipient of call):Mindy Cortez [place] NOTIFIED: 12/25  2000  MESSENGER (representative from lab):  MD NOTIFIED: Dr Manuella Ghazi  TIME OF NOTIFICATION: 2024  RESPONSE: Increase fluids to 125 cc/hr

## 2017-10-15 NOTE — ED Provider Notes (Signed)
[facility] Emergency Department Provider Note   ____________________________________________    I have reviewed the triage vital signs and the nursing notes.   HISTORY  Chief Complaint Labored breathing, altered mental status    HPI Mindy Cortez is a 82 y.o. female with a history of dementia recently discharged from the hospital who presents with labored breathing, tachycardia and increased altered mental status.  No fevers reported.  Reported low blood pressure at assisted living today.  Daughter reports patient appears significantly worsened and has labored breathing.  No cough reported.  Review of medical records demonstrates admission for sepsis discharge the 22nd, unable to determine source    Past Medical History:  Diagnosis Date  . Depression   . 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 HTN (hypertension)   . Hypertension   . Neuropathy   . Nocturia   . Non-Hodgkin lymphoma ([place])    Bone Marrow Transplant with chemo + Rad tx's.  . Tachycardia   . Urge incontinence   . Urinary incontinence     Patient Active Problem List   Diagnosis Date Noted  . Sepsis ([place]) 10/10/2017  . Atherosclerosis of native arteries of the extremities with ulceration ([place]) 12/11/2016  . Acquired absence of right hip 04/08/2015  . Bilateral cataracts 04/08/2015  . CCF (congestive cardiac failure) ([place]) 04/08/2015  . Clinical depression 04/08/2015  . Glaucoma 04/08/2015  . BP (high blood pressure) 04/08/2015  . Neuropathy 04/08/2015  . Insomnia, persistent 03/16/2015  . Mixed Alzheimer's and vascular dementia 03/16/2015  . Atrial fibrillation with rapid ventricular response ([place]) 02/08/2015  . B12 deficiency 08/02/2014  . Leg pain, right 06/09/2014  . Leg numbness 06/09/2014  . Endomyocardial disease ([place]) 11/13/2006  . Cardiac failure left ([place]) 11/13/2006  . Lymphoma, non-Hodgkin's ([place]) 11/13/2006  .  Infection and inflammatory reaction due to internal prosthetic device, implant, and graft 11/13/2006  . Infection of breast implant ([place]) 11/13/2006  . Lymphoma of extranodal and solid organ sites ([place]) 11/13/2006  . Cardiomyopathy ([place]) 11/13/2006    Past Surgical History:  Procedure Laterality Date  . BONE MARROW TRANSPLANT    . COMPLICATED TOTAL HIP PROSTHESIS AND METHYLMETHACRALATE REMOVAL W/O SPACER INSERTION    . PERIPHERAL VASCULAR CATHETERIZATION Right 06/18/2016   Procedure: Lower Extremity Angiography;  Surgeon: Algernon Huxley, MD;  Location: [facility];  Service: Cardiovascular;  Laterality: Right;  . PERIPHERAL VASCULAR CATHETERIZATION  06/18/2016   Procedure: Lower Extremity Intervention;  Surgeon: Algernon Huxley, MD;  Location: [facility];  Service: Cardiovascular;;  . TOTAL HIP ARTHROPLASTY  1517,6160    Prior to Admission medications   Medication Sig Start Date End Date Taking? Authorizing Provider  acetaminophen (TYLENOL) 325 MG tablet Take 650 mg by mouth every 6 (six) hours as needed.   Yes [provider]  apixaban (ELIQUIS) 5 MG TABS tablet Take 5 mg by mouth 2 (two) times daily.   Yes [provider]  aspirin EC 81 MG tablet Take 1 tablet (81 mg total) by mouth daily. 06/18/16  Yes Algernon Huxley, MD  cholecalciferol (VITAMIN D) 1000 units tablet Take 1,000 Units by mouth daily.   Yes [provider]  clindamycin (CLEOCIN) 300 MG capsule Take 1 capsule (300 mg total) by mouth every 8 (eight) hours. Patient taking differently: Take 300 mg by mouth 2 (two) times daily.  10/12/17  Yes Demetrios Loll, MD  digoxin (  LANOXIN) 0.125 MG tablet Take 0.125 mg by mouth daily.   Yes [provider]  donepezil (ARICEPT) 10 MG tablet Take 10 mg by mouth at bedtime.   Yes [provider]  DULoxetine (CYMBALTA) 30 MG capsule Take 30 mg by mouth daily.   Yes [provider]  DULoxetine (CYMBALTA) 60 MG capsule Take 60 mg by  mouth daily.   Yes [provider]  furosemide (LASIX) 20 MG tablet Take 20 mg by mouth daily as needed for edema.   Yes [provider]  loperamide (IMODIUM) 2 MG capsule Take 2 mg by mouth every 6 (six) hours as needed for diarrhea or loose stools.    Yes [provider]  losartan (COZAAR) 25 MG tablet Take 25 mg by mouth daily.   Yes [provider]  Melatonin 3 MG TBDP Take 3 mg by mouth at bedtime.   Yes [provider]  Multiple Vitamin (MULTIVITAMIN WITH MINERALS) TABS tablet Take 1 tablet by mouth daily.   Yes [provider]  vitamin B-12 (CYANOCOBALAMIN) 1000 MCG tablet Take 1,000 mcg by mouth daily.   Yes [provider]  atorvastatin (LIPITOR) 20 MG tablet Take 1 tablet (20 mg total) by mouth daily. Patient not taking: Reported on 10/10/2017 06/18/16   Algernon Huxley, MD     Allergies Penicillin v potassium; Prochlorperazine; Ramipril; Rifampin; Sulfa antibiotics; and Cephalexin  Family History  Problem Relation Age of Onset  . Osteosarcoma Father   . Heart disease Father   . Heart disease Mother   . Breast cancer Mother     Social History Social History   Tobacco Use  . Smoking status: Never Smoker  . Smokeless tobacco: Never Used  Substance Use Topics  . Alcohol use: Yes    Alcohol/week: 0.0 oz    Comment: rarely  . Drug use: No    Review of Systems  Constitutional: No fevers reported Eyes: No discharge reported ENT: No complaints of neck pain Cardiovascular: Denies chest pain. Respiratory: Increased work of breathing per daughter Gastrointestinal: , no vomiting.   Genitourinary: No reports of foul-smelling urine Musculoskeletal: Left hand edema Skin: Negative for rash. Neurological: Negative for focal weakness   ____________________________________________   PHYSICAL EXAM:  VITAL SIGNS: ED Triage Vitals [10/15/17 1427]  Enc Vitals Group     BP 118/67     Pulse Rate (!) 132     Resp 20      Temp 98.2 F (36.8 C)     Temp Source Oral     SpO2 95 %     Weight 63.5 kg (140 lb)     Height 1.651 m (5\' 5" )     Head Circumference      Peak Flow      Pain Score      Pain Loc      Pain Edu?      Excl. in [place]?     Constitutional: Alert. No acute distress.  Head: Atraumatic. Nose: No congestion/rhinnorhea. Mouth/Throat: Mucous membranes are dry Neck:  Painless ROM Cardiovascular: Tachycardia, regular rhythm. Grossly normal heart sounds.  Good peripheral circulation. Respiratory: Increased work of breathing, no retractions, positive tachypnea, bibasilar rales. Gastrointestinal: Soft and nontender. No distention.  No CVA tenderness. Genitourinary: deferred Musculoskeletal: Left hand edema, lower externally bilaterally 1+.  No erythema or joint swelling or warmth. warm and well perfused Neurologic:  Normal speech and language. No gross focal neurologic deficits are appreciated.  Skin:  Skin is warm, dry  and intact. No rash noted. Psychiatric: Mood and affect are normal. Speech and behavior are normal.  ____________________________________________   LABS (all labs ordered are listed, but only abnormal results are displayed)  Labs Reviewed  LACTIC ACID, PLASMA - Abnormal; Notable for the following components:      Result Value   Lactic Acid, Venous 2.1 (*)    All other components within normal limits  COMPREHENSIVE METABOLIC PANEL - Abnormal; Notable for the following components:   CO2 20 (*)    Glucose, Bld 138 (*)    Calcium 8.2 (*)    Albumin 2.2 (*)    ALT 13 (*)    GFR calc non Af Amer 57 (*)    All other components within normal limits  CBC WITH DIFFERENTIAL/PLATELET - Abnormal; Notable for the following components:   WBC 26.3 (*)    RBC 3.70 (*)    Hemoglobin 10.3 (*)    HCT 31.3 (*)    RDW 15.3 (*)    Neutro Abs 24.2 (*)    Lymphs Abs 0.3 (*)    Monocytes Absolute 1.7 (*)    All other components within normal limits  DIGOXIN LEVEL - Abnormal; Notable  for the following components:   Digoxin Level 0.6 (*)    All other components within normal limits  CULTURE, BLOOD (ROUTINE X 2)  CULTURE, BLOOD (ROUTINE X 2)  URINE CULTURE  LACTIC ACID, PLASMA  URINALYSIS, COMPLETE (UACMP) WITH MICROSCOPIC  URINALYSIS, ROUTINE W REFLEX MICROSCOPIC   ____________________________________________  EKG  ED ECG REPORT I, Lavonia Drafts, the attending physician, personally viewed and interpreted this ECG.  Date: 10/15/2017  Rhythm: Tachycardia QRS Axis: normal Intervals: Nonspecific interventricular conduction delay ST/T Wave abnormalities: normal   ____________________________________________  RADIOLOGY  X-ray unremarkable ____________________________________________   PROCEDURES  Procedure(s) performed: No  Procedures   Critical Care performed: No ____________________________________________   INITIAL IMPRESSION / ASSESSMENT AND PLAN / ED COURSE  Pertinent labs & imaging results that were available during my care of the patient were reviewed by me and considered in my medical decision making (see chart for details).  Patient with a history of dementia recent discharge presents with increased work of breathing, tachycardia.  Afebrile however high on the differential includes pneumonia, recurrent sepsis  Patient with elevated lactic and elevated white blood cell count, given this we will call code sepsis and treat with broad-spectrum antibiotics.  Chest x-ray is unremarkable.  No evidence of cellulitis.  Urinalysis pending.  Will admit to the hospitalist service for further management    ____________________________________________   FINAL CLINICAL IMPRESSION(S) / ED DIAGNOSES  Final diagnoses:  Sepsis, due to unspecified organism [facility])        Note:  This document was prepared using Dragon voice recognition software and may include unintentional dictation errors.    Lavonia Drafts, MD 10/15/17 [phone]

## 2017-10-15 NOTE — H&P (Signed)
[facility] NAME: Mindy Cortez    MR#:  [id number]  DATE OF BIRTH:  July 11, 1935  DATE OF ADMISSION:  10/15/2017  PRIMARY CARE PHYSICIAN: Marinda Elk, MD   REQUESTING/REFERRING PHYSICIAN: kinner,md  CHIEF COMPLAINT:   ams HISTORY OF PRESENT ILLNESS:  Mindy Cortez  is a 82 y.o. female with a known history of  hypertension, neuropathy, non-Hodgkin's lymphoma status post bone marrow transplantation, chronic systolic congestive heart failure with ejection fraction 25% was recently admitted to the hospital with a diagnosis of sepsis and discharged on December 22 with a discharge diagnosis of right toe ulcer and patient was sent home with the clindamycin, patient is compliant with the medication is brought into the hospital for altered mental status from assisted living facility by her daughter. Patient was more confused and having generalized weakness for the past 3-4 days. In the ED she was found to be hypotensive, tachycardic and white count is elevated at 26,000 and at the time of discharge during the previous admission it was at around 17,000. Chest x-ray is unremarkable. Patient is started on broad spectrum IV antibiotics and hospitalist team is called to admit the patient. Patient was given fluid boluses and slightly feeling better according to the daughter at bedside  PAST MEDICAL HISTORY:   Past Medical History:  Diagnosis Date  . Depression   . 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 HTN (hypertension)   . Hypertension   . Neuropathy   . Nocturia   . Non-Hodgkin lymphoma ([place])    Bone Marrow Transplant with chemo + Rad tx's.  . Tachycardia   . Urge incontinence   . Urinary incontinence     PAST SURGICAL HISTOIRY:   Past Surgical History:  Procedure Laterality Date  . BONE MARROW TRANSPLANT    . COMPLICATED TOTAL HIP PROSTHESIS AND METHYLMETHACRALATE REMOVAL W/O  SPACER INSERTION    . PERIPHERAL VASCULAR CATHETERIZATION Right 06/18/2016   Procedure: Lower Extremity Angiography;  Surgeon: Algernon Huxley, MD;  Location: [facility];  Service: Cardiovascular;  Laterality: Right;  . PERIPHERAL VASCULAR CATHETERIZATION  06/18/2016   Procedure: Lower Extremity Intervention;  Surgeon: Algernon Huxley, MD;  Location: [facility];  Service: Cardiovascular;;  . TOTAL HIP ARTHROPLASTY  [id number]    SOCIAL HISTORY:   Social History   Tobacco Use  . Smoking status: Never Smoker  . Smokeless tobacco: Never Used  Substance Use Topics  . Alcohol use: Yes    Alcohol/week: 0.0 oz    Comment: rarely    FAMILY HISTORY:   Family History  Problem Relation Age of Onset  . Osteosarcoma Father   . Heart disease Father   . Heart disease Mother   . Breast cancer Mother     DRUG ALLERGIES:   Allergies  Allergen Reactions  . Penicillin V Potassium Hives    Has patient had a PCN reaction causing immediate rash, facial/tongue/throat swelling, SOB or lightheadedness with hypotension: Unknown Has patient had a PCN reaction causing severe rash involving mucus membranes or skin necrosis: Unknown Has patient had a PCN reaction that required hospitalization: Unknown Has patient had a PCN reaction occurring within the last 10 years: Unknown If all of the above answers are "NO", then may proceed with Cephalosporin use.   Marland Kitchen Prochlorperazine Nausea And Vomiting    DIZZINESS, DRUNK FEELING. DIZZINESS, DRUNK FEELING.  . Ramipril Other (  See Comments)    SEVERE HEADACHE SEVERE HEADACHE  . Rifampin     Severe stomach irriatates cramping and nausea  . Sulfa Antibiotics     TOLD AS A CHILD, MADE PT NERVOUS, EMOTIONAL, TOLD NOT TO TAKE.  . Cephalexin Rash    RASH, ITCHING. RASH, ITCHING.    REVIEW OF SYSTEMS:  Review of systems unobtainable as the patient is altered  MEDICATIONS AT HOME:   Prior to Admission medications   Medication Sig Start Date End  Date Taking? Authorizing Provider  acetaminophen (TYLENOL) 325 MG tablet Take 650 mg by mouth every 6 (six) hours as needed.   Yes [provider]  apixaban (ELIQUIS) 5 MG TABS tablet Take 5 mg by mouth 2 (two) times daily.   Yes [provider]  aspirin EC 81 MG tablet Take 1 tablet (81 mg total) by mouth daily. 06/18/16  Yes Algernon Huxley, MD  cholecalciferol (VITAMIN D) 1000 units tablet Take 1,000 Units by mouth daily.   Yes [provider]  clindamycin (CLEOCIN) 300 MG capsule Take 1 capsule (300 mg total) by mouth every 8 (eight) hours. Patient taking differently: Take 300 mg by mouth 2 (two) times daily.  10/12/17  Yes Demetrios Loll, MD  digoxin (LANOXIN) 0.125 MG tablet Take 0.125 mg by mouth daily.   Yes [provider]  donepezil (ARICEPT) 10 MG tablet Take 10 mg by mouth at bedtime.   Yes [provider]  DULoxetine (CYMBALTA) 30 MG capsule Take 30 mg by mouth daily.   Yes [provider]  DULoxetine (CYMBALTA) 60 MG capsule Take 60 mg by mouth daily.   Yes [provider]  furosemide (LASIX) 20 MG tablet Take 20 mg by mouth daily as needed for edema.   Yes [provider]  loperamide (IMODIUM) 2 MG capsule Take 2 mg by mouth every 6 (six) hours as needed for diarrhea or loose stools.    Yes [provider]  losartan (COZAAR) 25 MG tablet Take 25 mg by mouth daily.   Yes [provider]  Melatonin 3 MG TBDP Take 3 mg by mouth at bedtime.   Yes [provider]  Multiple Vitamin (MULTIVITAMIN WITH MINERALS) TABS tablet Take 1 tablet by mouth daily.   Yes [provider]  vitamin B-12 (CYANOCOBALAMIN) 1000 MCG tablet Take 1,000 mcg by mouth daily.   Yes [provider]  atorvastatin (LIPITOR) 20 MG tablet Take 1 tablet (20 mg total) by mouth daily. Patient not taking: Reported on 10/10/2017 06/18/16   Algernon Huxley, MD      VITAL SIGNS:  Blood pressure (!) 118/96, pulse (!)  105, temperature 98.2 F (36.8 C), temperature source Oral, resp. rate (!) 31, height 5' 5"  (1.651 m), weight 64.4 kg (142 lb), SpO2 96 %.  PHYSICAL EXAMINATION:  GENERAL:  82 y.o.-year-old patient lying in the bed with no acute distress.  EYES: Pupils equal, round, reactive to light and accommodation. No scleral icterus. Extraocular muscles intact.  HEENT: Head atraumatic, normocephalic. Oropharynx and nasopharynx clear.  NECK:  Supple, no jugular venous distention. No thyroid enlargement, no tenderness.  LUNGS: Normal breath sounds bilaterally, no wheezing, rales,rhonchi or crepitation. No use of accessory muscles of respiration.  CARDIOVASCULAR: S1, S2 normal. No murmurs, rubs, or gallops.  ABDOMEN: Soft, nontender, nondistended. Bowel sounds present. No organomegaly or mass.  EXTREMITIES: Right foot second toe ulcer is healing. No pedal edema, cyanosis, or clubbing.  NEUROLOGIC: Patient is awake and alert but  oriented 1-2. Sensation intact. Gait not checked.  PSYCHIATRIC: The patient is alert and oriented x1-2  SKIN: No obvious rash, lesion  LABORATORY PANEL:   CBC Recent Labs  Lab 10/15/17 1522  WBC 26.3*  HGB 10.3*  HCT 31.3*  PLT 232   ------------------------------------------------------------------------------------------------------------------  Chemistries  Recent Labs  Lab 10/12/17 0340 10/15/17 1522  NA 136 137  K 3.1* 3.7  CL 106 106  CO2 23 20*  GLUCOSE 104* 138*  BUN 12 18  CREATININE 0.71 0.93  CALCIUM 8.2* 8.2*  MG 1.6*  --   AST  --  28  ALT  --  13*  ALKPHOS  --  122  BILITOT  --  1.1   ------------------------------------------------------------------------------------------------------------------  Cardiac Enzymes Recent Labs  Lab 10/10/17 1419  TROPONINI <0.03   ------------------------------------------------------------------------------------------------------------------  RADIOLOGY:  Dg Chest Portable 1 View  Result Date:  10/15/2017 CLINICAL DATA:  Tachycardia EXAM: PORTABLE CHEST 1 VIEW COMPARISON:  10/10/2017 FINDINGS: Left-sided ICD device. Mild cardiomegaly. No focal consolidation or effusion. No pneumothorax. IMPRESSION: Mild cardiomegaly.  Negative for edema or focal infiltrate. Electronically Signed   By: Donavan Foil M.D.   On: 10/15/2017 15:21    EKG:   Orders placed or performed during the hospital encounter of 10/15/17  . ED EKG  . ED EKG  . EKG 12-Lead  . EKG 12-Lead    IMPRESSION AND PLAN:   Mindy Cortez  is a 82 y.o. female with a known history of  hypertension, neuropathy, non-Hodgkin's lymphoma status post bone marrow transplantation, chronic systolic congestive heart failure with ejection fraction 25% was recently admitted to the hospital with a diagnosis of sepsis and discharged on December 22 with a discharge diagnosis of right toe ulcer and patient was sent home with the clindamycin, patient is compliant with the medication is brought into the hospital for altered mental status from assisted living facility by her daughter. Patient was more confused and having generalized weakness for the past 3-4 days. In the ED she was found to be hypotensive, tachycardic and white count is elevated  #Acute encephalopathy secondary to sepsis-unclear etiology Admitted to MedSurg unit Met septic criteria with hypotension, tachycardia, leukocytosis and elevated lactic acid Patient is started on broad-spectrum IV antibiotics aztreonam, levofloxacin and vancomycin per protocol as patient is allergic to penicillin IV fluids Blood cultures, urine cultures are ordered Will continue wound care of the second 2toe   #Essential hypertension Currently patient's blood pressure is soft. Hold antihypertensives.  #Chronic non-Hodgkin's lymphoma continue follow-up with cancer Center for surveillance as recommended  #Paroxysmal atrial fibrillation Patient was tachycardic but with IV fluids heart rate is  improving Continue home medication digoxin and eliquis   #Chronic history of dementia continue donepezil   GI prophylaxis with Protonix, DVT prophylaxis continue eliquis    All the records are reviewed and case discussed with ED provider. Management plans discussed with the patient's daughter at bed side and she is  in agreement. Daughter reported that she will communicate with her brother regarding her mom's care  CODE STATUS: Full code, son Rex Estorga is the healthcare power of attorney  TOTAL TIME TAKING CARE OF THIS PATIENT: 43 minutes.   Note: This dictation was prepared with Dragon dictation along with smaller phrase technology. Any transcriptional errors that result from this process are unintentional.  Nicholes Mango M.D on 10/15/2017 at 5:34 PM  Between 7am to 6pm - Pager - [phone]  After 6pm go to [email or web address] - password [email or web address] [facility]  [facility]  Office  [phone]  CC: Primary care physician; Marinda Elk, MD

## 2017-10-15 NOTE — ED Triage Notes (Addendum)
Patient presents to ED with family in wheelchair from [facility]. Patient reports she was recently admitted for hypotension and treated for MRSA. Patient arrives lethargic but arousable to verbal stimuli. Patient tachycardic in triage. Edema noted to right hand. Family also reports more confusion and combativeness.

## 2017-10-15 NOTE — ED Notes (Signed)
RN attempted to call report. Floor to call back in 5 minutes.

## 2017-10-15 NOTE — Progress Notes (Signed)
Lactate trending up from 2.4->3.9.  We will increase intravenous fluid rate to 125 cc an hour and repeat lactate at around 10 pm.  Blood pressure remains soft if not improved with IV fluids she may need transfer to stepdown unit

## 2017-10-15 NOTE — Consult Note (Signed)
Pharmacy Antibiotic Note  Mindy Cortez is a 82 y.o. female admitted on 10/15/2017 with sepsis.  Pharmacy has been consulted for vancomycin, levofloxacin and aztreonam dosing.  Plan: Vancomycin 1g given in the ED. Will give next dose in 7 hours for stacked dosing Vancomycin 500mg  IV every 12 hours.  Goal trough 15-20 mcg/mL.  Levofloxacin 750mg  q 48 hours Aztreonam 1g q 8 hours Pt w/ a PCN allergy (hives) and keflex allergy (rash)  Height: 5\' 5"  (165.1 cm) Weight: 142 lb (64.4 kg) IBW/kg (Calculated) : 57  Temp (24hrs), Avg:98.2 F (36.8 C), Min:98.2 F (36.8 C), Max:98.2 F (36.8 C)  Recent Labs  Lab 10/10/17 1419 10/10/17 1630 10/11/17 0519 10/12/17 0340 10/15/17 1522 10/15/17 1703  WBC 21.4*  --  18.7* 17.2* 26.3*  --   CREATININE 1.07*  --  0.93 0.71 0.93  --   LATICACIDVEN 2.9* 2.2*  --   --  2.1* 2.4*    Estimated Creatinine Clearance: 43.4 mL/min (by C-G formula based on SCr of 0.93 mg/dL).    Allergies  Allergen Reactions  . Penicillin V Potassium Hives    Has patient had a PCN reaction causing immediate rash, facial/tongue/throat swelling, SOB or lightheadedness with hypotension: Unknown Has patient had a PCN reaction causing severe rash involving mucus membranes or skin necrosis: Unknown Has patient had a PCN reaction that required hospitalization: Unknown Has patient had a PCN reaction occurring within the last 10 years: Unknown If all of the above answers are "NO", then may proceed with Cephalosporin use.   Marland Kitchen Prochlorperazine Nausea And Vomiting    DIZZINESS, DRUNK FEELING. DIZZINESS, DRUNK FEELING.  . Ramipril Other (See Comments)    SEVERE HEADACHE SEVERE HEADACHE  . Rifampin     Severe stomach irriatates cramping and nausea  . Sulfa Antibiotics     TOLD AS A CHILD, MADE PT NERVOUS, EMOTIONAL, TOLD NOT TO TAKE.  . Cephalexin Rash    RASH, ITCHING. RASH, ITCHING.    Antimicrobials this admission: zosyn 12/25 >> one dose Vancomycin  12/25 >>   Levofloxacin 12/25>> Aztreonam 12/25>>  Dose adjustments this admission:   Microbiology results: 12/25 BCx:  12/25 UCx:   PCT baseline 3.98  Thank you for allowing pharmacy to be a part of this patient's care.  Ramond Dial, Pharm.D, BCPS Clinical Pharmacist  10/15/2017 6:33 PM

## 2017-10-15 NOTE — ED Triage Notes (Signed)
First Nurse Note:  Arrives from assisted living with c/o hypotension today.  Daughter states patient recently treated for MRSA sepsis.

## 2017-10-16 ENCOUNTER — Inpatient Hospital Stay: Admit: 2017-10-16 | Payer: Medicare Other

## 2017-10-16 ENCOUNTER — Inpatient Hospital Stay: Payer: Medicare Other

## 2017-10-16 ENCOUNTER — Encounter: Payer: Self-pay | Admitting: Internal Medicine

## 2017-10-16 DIAGNOSIS — A419 Sepsis, unspecified organism: Secondary | ICD-10-CM

## 2017-10-16 DIAGNOSIS — R6520 Severe sepsis without septic shock: Secondary | ICD-10-CM

## 2017-10-16 DIAGNOSIS — R7881 Bacteremia: Secondary | ICD-10-CM

## 2017-10-16 DIAGNOSIS — R1031 Right lower quadrant pain: Secondary | ICD-10-CM

## 2017-10-16 DIAGNOSIS — R6521 Severe sepsis with septic shock: Secondary | ICD-10-CM

## 2017-10-16 LAB — BLOOD CULTURE ID PANEL (REFLEXED)
ACINETOBACTER BAUMANNII: NOT DETECTED
CANDIDA ALBICANS: NOT DETECTED
CANDIDA GLABRATA: NOT DETECTED
CANDIDA KRUSEI: NOT DETECTED
CANDIDA PARAPSILOSIS: NOT DETECTED
Candida tropicalis: NOT DETECTED
ENTEROBACTER CLOACAE COMPLEX: NOT DETECTED
ENTEROBACTERIACEAE SPECIES: NOT DETECTED
ENTEROCOCCUS SPECIES: NOT DETECTED
ESCHERICHIA COLI: NOT DETECTED
Haemophilus influenzae: NOT DETECTED
KLEBSIELLA OXYTOCA: NOT DETECTED
Klebsiella pneumoniae: NOT DETECTED
LISTERIA MONOCYTOGENES: NOT DETECTED
Methicillin resistance: DETECTED — AB
Neisseria meningitidis: NOT DETECTED
PSEUDOMONAS AERUGINOSA: NOT DETECTED
Proteus species: NOT DETECTED
STREPTOCOCCUS AGALACTIAE: NOT DETECTED
STREPTOCOCCUS PNEUMONIAE: NOT DETECTED
STREPTOCOCCUS PYOGENES: NOT DETECTED
Serratia marcescens: NOT DETECTED
Staphylococcus aureus (BCID): DETECTED — AB
Staphylococcus species: DETECTED — AB
Streptococcus species: NOT DETECTED

## 2017-10-16 LAB — BASIC METABOLIC PANEL
Anion gap: 8 (ref 5–15)
BUN: 16 mg/dL (ref 6–20)
CALCIUM: 6.9 mg/dL — AB (ref 8.9–10.3)
CO2: 19 mmol/L — AB (ref 22–32)
Chloride: 107 mmol/L (ref 101–111)
Creatinine, Ser: 0.85 mg/dL (ref 0.44–1.00)
GFR calc Af Amer: >60 mL/min (ref >60)
GLUCOSE: 108 mg/dL — AB (ref 65–99)
Potassium: 3.1 mmol/L — ABNORMAL LOW (ref 3.5–5.1)
Sodium: 134 mmol/L — ABNORMAL LOW (ref 135–145)

## 2017-10-16 LAB — CBC WITH DIFFERENTIAL/PLATELET
Basophils Absolute: 0 10*3/uL (ref 0–0.1)
Basophils Relative: 0 %
EOS PCT: 0 %
Eosinophils Absolute: 0 10*3/uL (ref 0–0.7)
HEMATOCRIT: 28.7 % — AB (ref 35.0–47.0)
Hemoglobin: 9.1 g/dL — ABNORMAL LOW (ref 12.0–16.0)
LYMPHS ABS: 0.6 10*3/uL — AB (ref 1.0–3.6)
LYMPHS PCT: 3 %
MCH: 27.1 pg (ref 26.0–34.0)
MCHC: 31.6 g/dL — ABNORMAL LOW (ref 32.0–36.0)
MCV: 85.8 fL (ref 80.0–100.0)
MONO ABS: 1.8 10*3/uL — AB (ref 0.2–0.9)
MONOS PCT: 9 %
NEUTROS ABS: 18.3 10*3/uL — AB (ref 1.4–6.5)
Neutrophils Relative %: 88 %
PLATELETS: 163 10*3/uL (ref 150–440)
RBC: 3.35 MIL/uL — ABNORMAL LOW (ref 3.80–5.20)
RDW: 15.7 % — AB (ref 11.5–14.5)
WBC: 20.7 10*3/uL — ABNORMAL HIGH (ref 3.6–11.0)

## 2017-10-16 LAB — URINE CULTURE: CULTURE: NO GROWTH

## 2017-10-16 LAB — PROCALCITONIN: Procalcitonin: 4.91 ng/mL

## 2017-10-16 LAB — GLUCOSE, CAPILLARY: Glucose-Capillary: 135 mg/dL — ABNORMAL HIGH (ref 65–99)

## 2017-10-16 MED ORDER — POTASSIUM CHLORIDE 2 MEQ/ML IV SOLN
INTRAVENOUS | Status: DC
  Administered 2017-10-16: 11:00:00 via INTRAVENOUS
  Filled 2017-10-16 (×2): qty 1000

## 2017-10-16 MED ORDER — HYDROCORTISONE NA SUCCINATE PF 100 MG IJ SOLR
100.0000 mg | Freq: Once | INTRAMUSCULAR | Status: AC
  Administered 2017-10-16: 100 mg via INTRAVENOUS
  Filled 2017-10-16: qty 2

## 2017-10-16 MED ORDER — SODIUM CHLORIDE 0.9 % IV BOLUS (SEPSIS): 1000.0000 mL | Freq: Once | INTRAVENOUS | Status: DC

## 2017-10-16 MED ORDER — SODIUM CHLORIDE 0.9 % IV SOLN
1.0000 g | Freq: Two times a day (BID) | INTRAVENOUS | Status: DC
  Administered 2017-10-16: 1 g via INTRAVENOUS
  Filled 2017-10-16 (×3): qty 1

## 2017-10-16 MED ORDER — SODIUM CHLORIDE 0.9 % IV BOLUS (SEPSIS)
1000.0000 mL | INTRAVENOUS | Status: AC
  Administered 2017-10-16 (×2): 1000 mL via INTRAVENOUS

## 2017-10-16 MED ORDER — POTASSIUM CHLORIDE 10 MEQ/100ML IV SOLN
10.0000 meq | INTRAVENOUS | Status: AC
  Administered 2017-10-16 (×2): 10 meq via INTRAVENOUS
  Filled 2017-10-16 (×2): qty 100

## 2017-10-16 MED ORDER — POTASSIUM CHLORIDE 20 MEQ PO PACK
60.0000 meq | PACK | Freq: Once | ORAL | Status: AC
  Administered 2017-10-16: 60 meq via ORAL
  Filled 2017-10-16: qty 3

## 2017-10-16 MED ORDER — VANCOMYCIN HCL IN DEXTROSE 750-5 MG/150ML-% IV SOLN
750.0000 mg | Freq: Two times a day (BID) | INTRAVENOUS | Status: DC
Start: 2017-10-16 — End: 2017-10-17
  Administered 2017-10-16 – 2017-10-17 (×3): 750 mg via INTRAVENOUS
  Filled 2017-10-16 (×5): qty 150

## 2017-10-16 NOTE — Progress Notes (Signed)
Lactic acid continues to trend up, patient hypotensive   Will transfer to step-down for close monitoring. May need pressors if not responsive to IVF.  D/w e-ICU

## 2017-10-16 NOTE — Consult Note (Signed)
[facility] Nurse wound consult note Reason for Consult:Nonhealing wound to right second metatarsal.  Was evaluated by podiatry 10/11/17: Vascular: Diminished PT/PTT pulses bilaterally but according to the patient's family member she has had angioplasty to improve lower extremity vascular flow. Toes have good capillary refill time at this point and are warm.  Dermatological: Patient has an ulcerative change on the medial second toe at the DIPJ level. Debridement and removal of devitalized tissue through an excisional process today showed this was a very superficial lesion proximately 5 mm in diameter with only 2-3 mm of depth involving skin and skin structures only. The second toe is slightly erythematous and edematous but is fairly mild and does not appear to be grossly infected. There is no purulent drainage from the area.  Neurological: Some peripheral neuropathy of uncertain origin Wound type: Mixed etiology, vascular and neuropathic ulcer Pressure Injury POA: Yes Measurement: 0.5 cm x 0.5 cm x0.2 cm  Wound ZOX:WRUE pink nongranulating Drainage (amount, consistency, odor) minimal serosanguinous  Musty odor Periwound: pale, macerated Dressing procedure/placement/frequency: Was treating with bactroban per podiatry with no improvement.  Noted allergy to sulfa drugs.  Cleanse wound to right toe with NS.  Cut a thin strip of Iodoform gauze and wrap around toe.  Cover with dry dressing. Change daily. Will not follow at this time.  Please re-consult if needed.  Domenic Moras RN BSN [place] Pager [phone]

## 2017-10-16 NOTE — Consult Note (Signed)
         [facility] for Infectious [facility]    Date of Admission:  10/15/2017   Total days of antibiotics 5        Day 2 vancomycin and levofloxacin  Ms. Thieme is an 82 year old female who was admitted to the hospital on 10/12/2017 with sepsis.  She was treated with broad empiric antibiotic therapy.  The source was felt to be a right second toe ulcer.  There was no evidence of osteomyelitis by plain films.  She was discharged on 10/12/2017 on clindamycin.  She was readmitted yesterday with worsening confusion, weakness and hypotension.  She was septic.  She was started on vancomycin and levofloxacin.  1 of 2 admission blood cultures has grown MRSA.  I will order repeat blood cultures.  I would hold off on PICC placement until we know repeat blood cultures are negative she has a history of heart failure and has an AICD in place.  I will also order a transthoracic echocardiogram.  This is a remote consultation.  We will follow with you         Michel Bickers, MD [facility] for [place] [phone] pager   [phone] cell 10/16/2017, 12:41 PM

## 2017-10-16 NOTE — Progress Notes (Signed)
Pharmacy BCID note:  Blood cx result called from lab at 1015:   2 of 4 bottles, I aerobic, 1 anaerobic:  + GPC: + Staph Aureus; MecA +  Information called to CCU clinical Pharmacist, Ulice Dash.  Chinita Greenland PharmD Clinical Pharmacist 10/16/2017

## 2017-10-16 NOTE — Progress Notes (Signed)
eLink Physician-Brief Progress Note Patient Name: Mindy Cortez DOB: 1935/09/28 MRN: [id number]   Date of Service  10/16/2017  HPI/Events of Note  72 F with recent hospitalization for sepsis related to an infected toe ulcer.  Discharged on the 22nd.  Now presenting with weakness and findings consistent with sepsis syndrome of unclear etiology as toe appeared improved.  Was initially on the floor but now with worsening lactic acidosis and softer BP.  Transferred to SDU for further evaluation and treatment.  On camera check the patient is alert in NAD.  Her RR is 27 with sats of 95% on RA.  HR is 103 with BP of 90/56 (66)  eICU Interventions  Plan of care per primary admitting team Receiving IVFs at 125 cc/hr - will need to monitor closely given diagnosis of CHF Continue to cycle lactate Consider pressors if BP trends down     Intervention Category Evaluation Type: New Patient Evaluation  DETERDING,ELIZABETH 10/16/2017, 1:10 AM

## 2017-10-16 NOTE — Consult Note (Signed)
Pharmacy Antibiotic Note  Mindy Cortez is a 82 y.o. female admitted on 10/15/2017 with sepsis.  Pharmacy has been consulted for vancomycin, levofloxacin and aztreonam dosing.  12/26: BCx: MRSA   Plan: Patient will be on vancomycin only now. ID is following.   Ke= 0.047 h-1, Vd= 43.4 L  Will increase vancomycin dosing to 750 mg iv q 12 hours and check a trough with the 5th dose of the new regimen. Goal trough 15-20 mcg/ml.    Height: 5\' 5"  (165.1 cm) Weight: 151 lb 0.2 oz (68.5 kg) IBW/kg (Calculated) : 57  Temp (24hrs), Avg:98.1 F (36.7 C), Min:97.5 F (36.4 C), Max:98.5 F (36.9 C)  Recent Labs  Lab 10/10/17 1630 10/11/17 0519 10/12/17 0340 10/15/17 1522 10/15/17 1703 10/15/17 1903 10/15/17 2255 10/16/17 0506  WBC  --  18.7* 17.2* 26.3*  --  19.4*  --  20.7*  CREATININE  --  0.93 0.71 0.93  --  1.02*  --  0.85  LATICACIDVEN 2.2*  --   --  2.1* 2.4* 3.9* 4.7*  --     Estimated Creatinine Clearance: 51.3 mL/min (by C-G formula based on SCr of 0.85 mg/dL).    Allergies  Allergen Reactions  . Penicillin V Potassium Hives    Has patient had a PCN reaction causing immediate rash, facial/tongue/throat swelling, SOB or lightheadedness with hypotension: Unknown Has patient had a PCN reaction causing severe rash involving mucus membranes or skin necrosis: Unknown Has patient had a PCN reaction that required hospitalization: Unknown Has patient had a PCN reaction occurring within the last 10 years: Unknown If all of the above answers are "NO", then may proceed with Cephalosporin use.   Marland Kitchen Prochlorperazine Nausea And Vomiting    DIZZINESS, DRUNK FEELING. DIZZINESS, DRUNK FEELING.  . Ramipril Other (See Comments)    SEVERE HEADACHE SEVERE HEADACHE  . Rifampin     Severe stomach irriatates cramping and nausea  . Sulfa Antibiotics     TOLD AS A CHILD, MADE PT NERVOUS, EMOTIONAL, TOLD NOT TO TAKE.  . Cephalexin Rash    RASH, ITCHING. RASH, ITCHING.    Antimicrobials  this admission: zosyn 12/25 >> one dose Vancomycin  12/25 >>  Levofloxacin 12/25>> 12/26 Aztreonam 12/25 x 1 Meropenem 12/26 x 1  Dose adjustments this admission: Vancomycin 500 q12 >> 750 q 12  Microbiology results: 12/25 BCx: MRSA 12/25 UCx:  Sent 12/26 BCx: sent  Thank you for allowing pharmacy to be a part of this patient's care.  Ulice Dash, PharmD Clinical Pharmacist  10/16/2017 2:01 PM

## 2017-10-16 NOTE — Progress Notes (Signed)
Spoke with dtr Patti at [phone] and explained that the patient is being transferred to ICU 10 for increased lactic acid and low blood pressure.  Report given and Pt transferred by bed to ICU. Dorna Bloom RN

## 2017-10-16 NOTE — Progress Notes (Signed)
PHARMACY - PHYSICIAN COMMUNICATION CRITICAL VALUE ALERT - BLOOD CULTURE IDENTIFICATION (BCID)  Results for orders placed or performed during the hospital encounter of 10/15/17  Blood Culture ID Panel (Reflexed) (Collected: 10/15/2017  2:39 PM)  Result Value Ref Range   Enterococcus species NOT DETECTED NOT DETECTED   Listeria monocytogenes NOT DETECTED NOT DETECTED   Staphylococcus species DETECTED (A) NOT DETECTED   Staphylococcus aureus DETECTED (A) NOT DETECTED   Methicillin resistance DETECTED (A) NOT DETECTED   Streptococcus species NOT DETECTED NOT DETECTED   Streptococcus agalactiae NOT DETECTED NOT DETECTED   Streptococcus pneumoniae NOT DETECTED NOT DETECTED   Streptococcus pyogenes NOT DETECTED NOT DETECTED   Acinetobacter baumannii NOT DETECTED NOT DETECTED   Enterobacteriaceae species NOT DETECTED NOT DETECTED   Enterobacter cloacae complex NOT DETECTED NOT DETECTED   Escherichia coli NOT DETECTED NOT DETECTED   Klebsiella oxytoca NOT DETECTED NOT DETECTED   Klebsiella pneumoniae NOT DETECTED NOT DETECTED   Proteus species NOT DETECTED NOT DETECTED   Serratia marcescens NOT DETECTED NOT DETECTED   Haemophilus influenzae NOT DETECTED NOT DETECTED   Neisseria meningitidis NOT DETECTED NOT DETECTED   Pseudomonas aeruginosa NOT DETECTED NOT DETECTED   Candida albicans NOT DETECTED NOT DETECTED   Candida glabrata NOT DETECTED NOT DETECTED   Candida krusei NOT DETECTED NOT DETECTED   Candida parapsilosis NOT DETECTED NOT DETECTED   Candida tropicalis NOT DETECTED NOT DETECTED    Name of physician (or Provider) Contacted: Dr. Alva Garnet  Changes to prescribed antibiotics required: Levaquin and vancomycin per CCM, subsequently ID d/c Levaquin  Ulice Dash D 10/16/2017  1:11 PM

## 2017-10-16 NOTE — Progress Notes (Signed)
CRITICAL VALUE STICKER  CRITICAL VALUE: Lactic acid 4.7  RECEIVER (on-site recipient of call):Robertlee Rogacki Wheelwright NOTIFIED: 10/14/17 2350  MESSENGER (representative from lab):  MD NOTIFIED: Dr Manuella Ghazi  TIME OF NOTIFICATION: 10/15/17  0000  RESPONSE: Transfer to ICU.  Bed pending.

## 2017-10-16 NOTE — Consult Note (Signed)
PULMONARY / CRITICAL CARE MEDICINE   Name: Mindy Cortez MRN: [id number] DOB: Aug 26, 1935    ADMISSION DATE:  10/15/2017 CONSULTATION DATE: 10/15/2017  REFERRING MD: Dr. Manuella Ghazi  Reason: Sepsis, septic shock  HISTORY OF PRESENT ILLNESS:   This is an 82 year old Caucasian female with a past medical history as indicated below presenting with recurrent sepsis of unknown etiology.  Patient was recently discharged on 12 October 2017 after being treated for sepsis of unknown source.  Her blood cultures at the time were all negative.  She was readmitted on 10/15/2017 with similar symptoms.  She is being transferred to the ICU for worsening lactic acidosis and hypotension despite fluid resuscitation.  Lactic acid is up to 4.7 from 2.1 upon admission.  PAST MEDICAL HISTORY :  She  has a past medical history of Depression, Hip deformity, HTN (hypertension), Hypertension, Neuropathy, Nocturia, Non-Hodgkin lymphoma ([place]), Tachycardia, Urge incontinence, and Urinary incontinence.  PAST SURGICAL HISTORY: She  has a past surgical history that includes Total hip arthroplasty (3007,6226); Complicated total hip prosthesis and methylmethacralate removal w/o spacer insertion; Bone marrow transplant; Cardiac catheterization (Right, 06/18/2016); and Cardiac catheterization (06/18/2016).  Allergies  Allergen Reactions  . Penicillin V Potassium Hives    Has patient had a PCN reaction causing immediate rash, facial/tongue/throat swelling, SOB or lightheadedness with hypotension: Unknown Has patient had a PCN reaction causing severe rash involving mucus membranes or skin necrosis: Unknown Has patient had a PCN reaction that required hospitalization: Unknown Has patient had a PCN reaction occurring within the last 10 years: Unknown If all of the above answers are "NO", then may proceed with Cephalosporin use.   Marland Kitchen Prochlorperazine Nausea And Vomiting    DIZZINESS, DRUNK FEELING. DIZZINESS, DRUNK FEELING.  . Ramipril  Other (See Comments)    SEVERE HEADACHE SEVERE HEADACHE  . Rifampin     Severe stomach irriatates cramping and nausea  . Sulfa Antibiotics     TOLD AS A CHILD, MADE PT NERVOUS, EMOTIONAL, TOLD NOT TO TAKE.  . Cephalexin Rash    RASH, ITCHING. RASH, ITCHING.    No current facility-administered medications on file prior to encounter.    Current Outpatient Medications on File Prior to Encounter  Medication Sig  . acetaminophen (TYLENOL) 325 MG tablet Take 650 mg by mouth every 6 (six) hours as needed.  Marland Kitchen apixaban (ELIQUIS) 5 MG TABS tablet Take 5 mg by mouth 2 (two) times daily.  Marland Kitchen aspirin EC 81 MG tablet Take 1 tablet (81 mg total) by mouth daily.  . cholecalciferol (VITAMIN D) 1000 units tablet Take 1,000 Units by mouth daily.  . clindamycin (CLEOCIN) 300 MG capsule Take 1 capsule (300 mg total) by mouth every 8 (eight) hours. (Patient taking differently: Take 300 mg by mouth 2 (two) times daily. )  . digoxin (LANOXIN) 0.125 MG tablet Take 0.125 mg by mouth daily.  Marland Kitchen donepezil (ARICEPT) 10 MG tablet Take 10 mg by mouth at bedtime.  . DULoxetine (CYMBALTA) 30 MG capsule Take 30 mg by mouth daily.  . DULoxetine (CYMBALTA) 60 MG capsule Take 60 mg by mouth daily.  . furosemide (LASIX) 20 MG tablet Take 20 mg by mouth daily as needed for edema.  Marland Kitchen loperamide (IMODIUM) 2 MG capsule Take 2 mg by mouth every 6 (six) hours as needed for diarrhea or loose stools.   Marland Kitchen losartan (COZAAR) 25 MG tablet Take 25 mg by mouth daily.  . Melatonin 3 MG TBDP Take 3 mg by mouth at bedtime.  . Multiple Vitamin (  MULTIVITAMIN WITH MINERALS) TABS tablet Take 1 tablet by mouth daily.  . vitamin B-12 (CYANOCOBALAMIN) 1000 MCG tablet Take 1,000 mcg by mouth daily.  Marland Kitchen atorvastatin (LIPITOR) 20 MG tablet Take 1 tablet (20 mg total) by mouth daily. (Patient not taking: Reported on 10/10/2017)    FAMILY HISTORY:  Her indicated that the status of her mother is unknown. She indicated that the status of her father is  unknown.   SOCIAL HISTORY: She  reports that  has never smoked. she has never used smokeless tobacco. She reports that she drinks alcohol. She reports that she does not use drugs.  REVIEW OF SYSTEMS:   Constitutional: Reports fever and chills.  HENT: Negative for congestion and rhinorrhea.  Eyes: Negative for redness and visual disturbance.  Respiratory: Negative for shortness of breath and wheezing.  Cardiovascular: Negative for chest pain and palpitations.  Gastrointestinal: Negative  for nausea , vomiting and  Loose stools positive for right lower abdominal quadrant pain Genitourinary: Negative for dysuria and urgency.  Endocrine: Denies polyuria, polyphagia and heat intolerance Musculoskeletal: Positive for right hip pain and multiple hip surgeries, nonambulatory Skin: Negative for pallor and wound.  Neurological: Negative for dizziness and headaches   SUBJECTIVE:   VITAL SIGNS: BP 99/64   Pulse 96   Temp (!) 97.5 F (36.4 C) (Axillary)   Resp (!) 33   Ht 5\' 5"  (1.651 m)   Wt 68.5 kg (151 lb 0.2 oz)   SpO2 92%   BMI 25.13 kg/m   HEMODYNAMICS:    VENTILATOR SETTINGS:    INTAKE / OUTPUT: I/O last 3 completed shifts: In: 1727.5 [I.V.:1327.5; IV Piggyback:400] Out: -   PHYSICAL EXAMINATION: General: Acutely ill looking Neuro: Alert and oriented to person place; cranial nerves intact HEENT: PERRLA, oral mucosa dry Cardiovascular: Apical pulse tachycardic, regular, S1-S2, no murmur regurg or gallop, +2 pulses, no edema Lungs: Bilateral breath sounds, no wheezes or rhonchi, diminished in the bases Abdomen: Nondistended, normal bowel sounds in all 4 quadrants, palpation reveals non-reducible lower abdominal quadrant mass, painful with gentle palpation Musculoskeletal: Right hip externally rotated, pain with mild flexion and extension Skin: Warm and dry LABS:  BMET Recent Labs  Lab 10/15/17 1522 10/15/17 1903 10/16/17 0506  NA 137 135 134*  K 3.7 3.5 3.1*   CL 106 106 107  CO2 20* 20* 19*  BUN 18 18 16   CREATININE 0.93 1.02* 0.85  GLUCOSE 138* 231* 108*    Electrolytes Recent Labs  Lab 10/12/17 0340 10/15/17 1522 10/15/17 1903 10/16/17 0506  CALCIUM 8.2* 8.2* 7.4* 6.9*  MG 1.6*  --   --   --     CBC Recent Labs  Lab 10/15/17 1522 10/15/17 1903 10/16/17 0506  WBC 26.3* 19.4* 20.7*  HGB 10.3* 9.4* 9.1*  HCT 31.3* 29.2* 28.7*  PLT 232 178 163    Coag's Recent Labs  Lab 10/10/17 1419 10/10/17 2136 10/15/17 1903  APTT 51* 53* 42*  INR 2.11 2.25 1.72    Sepsis Markers Recent Labs  Lab 10/15/17 1522 10/15/17 1703 10/15/17 1903 10/15/17 2255 10/16/17 0506  LATICACIDVEN 2.1* 2.4* 3.9* 4.7*  --   PROCALCITON 3.98  --  4.50  --  4.91    ABG No results for input(s): PHART, PCO2ART, PO2ART in the last 168 hours.  Liver Enzymes Recent Labs  Lab 10/10/17 1419 10/15/17 1522 10/15/17 1903  AST 24 28 39  ALT 9* 13* 13*  ALKPHOS 137* 122 120  BILITOT 1.1 1.1 1.2  ALBUMIN  3.2* 2.2* 2.0*    Cardiac Enzymes Recent Labs  Lab 10/10/17 1419  TROPONINI <0.03    Glucose Recent Labs  Lab 10/16/17 0108  GLUCAP 135*    Imaging Dg Chest Portable 1 View  Result Date: 10/15/2017 CLINICAL DATA:  Tachycardia EXAM: PORTABLE CHEST 1 VIEW COMPARISON:  10/10/2017 FINDINGS: Left-sided ICD device. Mild cardiomegaly. No focal consolidation or effusion. No pneumothorax. IMPRESSION: Mild cardiomegaly.  Negative for edema or focal infiltrate. Electronically Signed   By: Donavan Foil M.D.   On: 10/15/2017 15:21   Dg Hip Unilat With Pelvis 1v Right  Result Date: 10/16/2017 CLINICAL DATA:  Right hip pain. EXAM: DG HIP (WITH OR WITHOUT PELVIS) 1V RIGHT COMPARISON:  Radiographs dated 10/10/2017 and CT scan dated 09/05/2017 FINDINGS: There has been no change since the prior exams. Again noted is previous resection of proximal femur with evidence of a removed hip prosthesis. There is marked deformity and sclerosis of the right  acetabulum. Dystrophic calcifications and probable methylmethacrylate remnants are noted, unchanged. There is no new bone destruction or fracture. The pelvic bones otherwise appear normal. IMPRESSION: No acute abnormality. No change since the prior exams. 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 Electronically Signed   By: Lorriane Shire M.D.   On: 10/16/2017 07:09    STUDIES:  CT abdomen pending  CULTURES: Blood cultures x2 Urine culture  ANTIBIOTICS: Meropenem Vancomycin Levaquin  SIGNIFICANT EVENTS: 12/22> discharged 12/25: Readmitted with sepsis of unknown source  LINES/TUBES: Peripheral IVs  DISCUSSION: 82 year old female presenting with recurrent sepsis of unknown etiology, abdominal mass-questionable strangulated hernia, and right hip pain.  ASSESSMENT Sepsis of unknown source Septic shock Acute encephalopathy secondary to sepsis-mentation improved  Right hip pain- patient has history of hip joint following removal Lactic acidosis Hypomagnesemia History of tachycardia and hypotension  PLAN Hemodynamic monitoring per ICU protocol Antibiotics as above CT abdomen to rule out GI source X-ray right hip IV fluids Monitor and replace electrolytes Continue all home medications  FAMILY  - Updates: Family at bedside.  Will update when available  - Inter-disciplinary family meet or Palliative Care meeting due by:  day [place]. [facility] ANP-BC Pulmonary and Critical Care Medicine [facility] Pager [phone] or [phone]

## 2017-10-17 ENCOUNTER — Inpatient Hospital Stay: Admit: 2017-10-17 | Payer: Medicare Other

## 2017-10-17 LAB — CBC
HEMATOCRIT: 30.2 % — AB (ref 35.0–47.0)
HEMOGLOBIN: 9.8 g/dL — AB (ref 12.0–16.0)
MCH: 27.3 pg (ref 26.0–34.0)
MCHC: 32.5 g/dL (ref 32.0–36.0)
MCV: 84.2 fL (ref 80.0–100.0)
Platelets: 201 10*3/uL (ref 150–440)
RBC: 3.58 MIL/uL — ABNORMAL LOW (ref 3.80–5.20)
RDW: 16.1 % — AB (ref 11.5–14.5)
WBC: 26.1 10*3/uL — AB (ref 3.6–11.0)

## 2017-10-17 LAB — COMPREHENSIVE METABOLIC PANEL
ALBUMIN: 1.8 g/dL — AB (ref 3.5–5.0)
ALK PHOS: 113 U/L (ref 38–126)
ALT: 16 U/L (ref 14–54)
ANION GAP: 6 (ref 5–15)
AST: 33 U/L (ref 15–41)
BILIRUBIN TOTAL: 0.8 mg/dL (ref 0.3–1.2)
BUN: 22 mg/dL — AB (ref 6–20)
CO2: 19 mmol/L — AB (ref 22–32)
Calcium: 7.6 mg/dL — ABNORMAL LOW (ref 8.9–10.3)
Chloride: 111 mmol/L (ref 101–111)
Creatinine, Ser: 0.67 mg/dL (ref 0.44–1.00)
GFR calc Af Amer: >60 mL/min (ref >60)
GFR calc non Af Amer: >60 mL/min (ref >60)
GLUCOSE: 121 mg/dL — AB (ref 65–99)
POTASSIUM: 4 mmol/L (ref 3.5–5.1)
SODIUM: 136 mmol/L (ref 135–145)
Total Protein: 5.9 g/dL — ABNORMAL LOW (ref 6.5–8.1)

## 2017-10-17 LAB — PROCALCITONIN: PROCALCITONIN: 4.35 ng/mL

## 2017-10-17 MED ORDER — VANCOMYCIN HCL IN DEXTROSE 750-5 MG/150ML-% IV SOLN: 750.0000 mg | Freq: Two times a day (BID) | INTRAVENOUS | 0 refills | Status: DC

## 2017-10-17 MED ORDER — OXYCODONE-ACETAMINOPHEN 5-325 MG PO TABS
1.0000 | ORAL_TABLET | Freq: Four times a day (QID) | ORAL | Status: DC | PRN
  Administered 2017-10-17: 1 via ORAL
  Filled 2017-10-17: qty 1

## 2017-10-17 MED ORDER — METOPROLOL TARTRATE 25 MG PO TABS: 12.5000 mg | ORAL_TABLET | Freq: Two times a day (BID) | ORAL | 0 refills | Status: AC

## 2017-10-17 MED ORDER — METOPROLOL TARTRATE 25 MG PO TABS
12.5000 mg | ORAL_TABLET | Freq: Two times a day (BID) | ORAL | Status: DC
  Administered 2017-10-17: 12.5 mg via ORAL
  Filled 2017-10-17: qty 1

## 2017-10-17 MED ORDER — DULOXETINE HCL 30 MG PO CPEP: 90.0000 mg | ORAL_CAPSULE | Freq: Every day | ORAL | 0 refills | Status: AC

## 2017-10-17 NOTE — Care Management (Addendum)
Patient with recent admission and discharge back to [facility].spoke with [name] from the facility and relayed that patient was not receiving any home health since last discharge.  Agreeable to have home health if patient requires and no agency preference. Will anticipate home health physical therapy and nursing. [facility] would be in network with patient's medicare uhc policy.  Patient has positive blood cultures. ID is following.  Considering TEE to rule out  cardiac device endocarditis due to her AICD. Holding on PICC at present. If patient should require long term IV antibiotics, this most likley could not be managed in the family care home environment.

## 2017-10-17 NOTE — Clinical Social Work Note (Signed)
Clinical Social Work Assessment  Patient Details  Name: Mindy Cortez MRN: [id number] Date of Birth: 12/18/1934  Date of referral:  10/17/17               Reason for consult:  Facility Placement                Permission sought to share information with:  Facility Sport and exercise psychologist, Family Supports Permission granted to share information::  Yes, Verbal Permission Granted  Name::     [facility] Daughter   [phone] or Florean, Ho[place]ler [phone] or Rosalin, Buster [phone] or Mattisen, [place] Daughter [phone]   Agency::  SNF admissions  Relationship::     Contact Information:     Housing/Transportation Living arrangements for the past 2 months:  [place] of Information:  Patient, Adult Children Patient Interpreter Needed:  None Criminal Activity/Legal Involvement Pertinent to Current Situation/Hospitalization:  No - Comment as needed Significant Relationships:  Adult Children Lives with:  Facility Resident Do you feel safe going back to the place where you live?  No Need for family participation in patient care:  Yes (Comment)  Care giving concerns:  Patient feels like she needs some short term rehab before she is able to return back.   Social Worker assessment / plan:  Patient is an 82 year old female who is alert and oriented x4.  Patient is from [place], and has been there for several months.  Patient states that she is happy how things are going.  Patient's daughter in law was at bedside, and agrees they are satisfied how things are going.  Patient was explained role of CSW and process of trying to find SNF.  Patient would like to go to SNF for some short term rehab.  Patient was explained how insurance will pay for stay and what to expect.  Patient gave CSW permission to begin bed search in [place].  Employment status:  Retired Nurse, adult PT Recommendations:  [place] / Referral to community resources:  [place]  Patient/Family's Response to care:  Patient and family are agreeable to going to SNF for short term rehab.  Patient/Family's Understanding of and Emotional Response to Diagnosis, Current Treatment, and Prognosis:  Patient is hopeful she will not have to be in SNF for very long.  Emotional Assessment Appearance:  Appears stated age Attitude/Demeanor/Rapport:    Affect (typically observed):  Appropriate, Calm, Stable Orientation:  Oriented to Self, Oriented to Place, Oriented to  Time, Oriented to Situation Alcohol / Substance use:  Not Applicable Psych involvement (Current and /or in the community):  No (Comment)  Discharge Needs  Concerns to be addressed:  Care Coordination Readmission within the last 30 days:  Yes(10-12-17 to [facility].) Current discharge risk:  Lack of support system Barriers to Discharge:  Continued Medical Work up   Anell Barr 10/17/2017, 5:04 PM

## 2017-10-17 NOTE — Consult Note (Signed)
[facility] Consultation Note  Patient ID: Mindy Cortez, MRN: [id number], DOB/AGE: 1935/10/20 82 y.o. Admit date: 10/15/2017   Date of Consult: 10/17/2017 Primary Physician: Marinda Elk, MD Primary Cardiologist: Fath  Chief Complaint:  Chief Complaint  Patient presents with  . Tachycardia   Reason for Consult: MRSA bacteremia with atrial fibrillation  HPI: 82 y.o. female with known apparent chronic systolic dysfunction congestive heart failure on appropriate medication management including high intensity cholesterol therapy but cannot tolerate other medication management due to hypotension.  The patient has had paroxysmal nonvalvular atrial fibrillation previously on digoxin and Eliquis which has been very well controlled at this time.  Frequent preventricular contractions.  Recently she has had significant overall feeling of poor health weakness fatigue and had been seen in the emergency room with significant white blood cell count elevation and infection.  After further evaluation and treatment the patient has had multiple blood cultures with MRSA bacteremia.  The possible source of this bacteremia appears to be from the toes.  There is no evidence of abrasions or changes in skin or heat at the site of a defibrillator in the left upper chest.  The patient otherwise has now had blood cultures which are negative and she is feeling slightly improved.  We have discussed that there is significant concerns of defibrillator device with MRSA bacteremia and the only way to completely clear the bacteremia long-term is to remove the device.  This is a class I indication for defibrillator and wire removal due to infection  Past Medical History:  Diagnosis Date  . AICD (automatic cardioverter/defibrillator) present    non-functioning due to no active battery in place  . Atrial fibrillation ([place])   . CHF (congestive heart failure) ([place])   . Depression   . Hip deformity    acquired  abscence of hip joint following removal of joint prosthesis  . HTN (hypertension)   . Hypertension   . Neuropathy   . Nocturia   . Non-Hodgkin lymphoma ([place])    Bone Marrow Transplant with chemo + Rad tx's.  . Tachycardia   . Urge incontinence   . Urinary incontinence       Surgical History:  Past Surgical History:  Procedure Laterality Date  . BONE MARROW TRANSPLANT    . COMPLICATED TOTAL HIP PROSTHESIS AND METHYLMETHACRALATE REMOVAL W/O SPACER INSERTION    . PERIPHERAL VASCULAR CATHETERIZATION Right 06/18/2016   Procedure: Lower Extremity Angiography;  Surgeon: Algernon Huxley, MD;  Location: [facility];  Service: Cardiovascular;  Laterality: Right;  . PERIPHERAL VASCULAR CATHETERIZATION  06/18/2016   Procedure: Lower Extremity Intervention;  Surgeon: Algernon Huxley, MD;  Location: [facility];  Service: Cardiovascular;;  . TOTAL HIP ARTHROPLASTY  1443,1540     Home Meds: Prior to Admission medications   Medication Sig Start Date End Date Taking? Authorizing Provider  acetaminophen (TYLENOL) 325 MG tablet Take 650 mg by mouth every 6 (six) hours as needed.   Yes [provider]  apixaban (ELIQUIS) 5 MG TABS tablet Take 5 mg by mouth 2 (two) times daily.   Yes [provider]  aspirin EC 81 MG tablet Take 1 tablet (81 mg total) by mouth daily. 06/18/16  Yes Algernon Huxley, MD  cholecalciferol (VITAMIN D) 1000 units tablet Take 1,000 Units by mouth daily.   Yes [provider]  clindamycin (CLEOCIN) 300 MG capsule Take 1 capsule (300 mg total) by mouth every 8 (eight) hours. Patient taking differently:  Take 300 mg by mouth 2 (two) times daily.  10/12/17  Yes Demetrios Loll, MD  digoxin (LANOXIN) 0.125 MG tablet Take 0.125 mg by mouth daily.   Yes [provider]  donepezil (ARICEPT) 10 MG tablet Take 10 mg by mouth at bedtime.   Yes [provider]  DULoxetine (CYMBALTA) 30 MG capsule Take 30 mg by mouth daily.   Yes [provider]  DULoxetine (CYMBALTA) 60 MG capsule Take 60 mg by mouth daily.   Yes [provider]  furosemide (LASIX) 20 MG tablet Take 20 mg by mouth daily as needed for edema.   Yes [provider]  loperamide (IMODIUM) 2 MG capsule Take 2 mg by mouth every 6 (six) hours as needed for diarrhea or loose stools.    Yes [provider]  losartan (COZAAR) 25 MG tablet Take 25 mg by mouth daily.   Yes [provider]  Melatonin 3 MG TBDP Take 3 mg by mouth at bedtime.   Yes [provider]  Multiple Vitamin (MULTIVITAMIN WITH MINERALS) TABS tablet Take 1 tablet by mouth daily.   Yes [provider]  vitamin B-12 (CYANOCOBALAMIN) 1000 MCG tablet Take 1,000 mcg by mouth daily.   Yes [provider]  atorvastatin (LIPITOR) 20 MG tablet Take 1 tablet (20 mg total) by mouth daily. Patient not taking: Reported on 10/10/2017 06/18/16   Algernon Huxley, MD    Inpatient Medications:  . apixaban  5 mg Oral BID  . aspirin EC  81 mg Oral Daily  . atorvastatin  20 mg Oral Daily  . cholecalciferol  1,000 Units Oral Daily  . digoxin  0.125 mg Oral Daily  . donepezil  10 mg Oral QHS  . DULoxetine  90 mg Oral Daily  . Melatonin  5 mg Oral QHS  . multivitamin with minerals  1 tablet Oral Daily  . vitamin B-12  1,000 mcg Oral Daily   . vancomycin Stopped (10/17/17 0430)    Allergies:  Allergies  Allergen Reactions  . Penicillin V Potassium Hives    Has patient had a PCN reaction causing immediate rash, facial/tongue/throat swelling, SOB or lightheadedness with hypotension: Unknown Has patient had a PCN reaction causing severe rash involving mucus membranes or skin necrosis: Unknown Has patient had a PCN reaction that required hospitalization: Unknown Has patient had a PCN reaction occurring within the last 10 years: Unknown If all of the above answers are "NO", then may proceed with Cephalosporin use.   Marland Kitchen Prochlorperazine Nausea And  Vomiting    DIZZINESS, DRUNK FEELING. DIZZINESS, DRUNK FEELING.  . Ramipril Other (See Comments)    SEVERE HEADACHE SEVERE HEADACHE  . Rifampin     Severe stomach irriatates cramping and nausea  . Sulfa Antibiotics     TOLD AS A CHILD, MADE PT NERVOUS, EMOTIONAL, TOLD NOT TO TAKE.  . Cephalexin Rash    RASH, ITCHING. RASH, ITCHING.    Social History   Socioeconomic History  . Marital status: Single    Spouse name: Not on file  . Number of children: Not on file  . Years of education: Not on file  . Highest education level: Not on file  Social Needs  . Financial resource strain: Not hard at all  . Food insecurity - worry: Never true  . Food insecurity - inability: Never true  . Transportation needs - medical: No  . Transportation needs - non-medical: No  Occupational History  . Not on file  Tobacco  Use  . Smoking status: Never Smoker  . Smokeless tobacco: Never Used  Substance and Sexual Activity  . Alcohol use: Yes    Alcohol/week: 0.0 oz    Comment: rarely  . Drug use: No  . Sexual activity: No  Other Topics Concern  . Not on file  Social History Narrative   Lives at [employer] [facility]     Family History  Problem Relation Age of Onset  . Osteosarcoma Father   . Heart disease Father   . Heart disease Mother   . Breast cancer Mother      Review of Systems Positive for weakness fatigue Negative for: General:  chills, fever, night sweats or weight changes.  Cardiovascular: PND orthopnea syncope dizziness  Dermatological skin lesions rashes Respiratory: Cough congestion Urologic: Frequent urination urination at night and hematuria Abdominal: negative for nausea, vomiting, diarrhea, bright red blood per rectum, melena, or hematemesis Neurologic: negative for visual changes, and/or hearing changes  All other systems reviewed and are otherwise negative except as noted above.  Labs: No results for input(s): CKTOTAL, CKMB, TROPONINI in the last 72 hours. Lab  Results  Component Value Date   WBC 26.1 (H) 10/17/2017   HGB 9.8 (L) 10/17/2017   HCT 30.2 (L) 10/17/2017   MCV 84.2 10/17/2017   PLT 201 10/17/2017    Recent Labs  Lab 10/17/17 0325  NA 136  K 4.0  CL 111  CO2 19*  BUN 22*  CREATININE 0.67  CALCIUM 7.6*  PROT 5.9*  BILITOT 0.8  ALKPHOS 113  ALT 16  AST 33  GLUCOSE 121*   No results found for: CHOL, HDL, LDLCALC, TRIG No results found for: DDIMER  Radiology/Studies:  Ct Head Wo Contrast  Result Date: 10/10/2017 CLINICAL DATA:  82 year old female with altered level of consciousness and headache. EXAM: CT HEAD WITHOUT CONTRAST TECHNIQUE: Contiguous axial images were obtained from the base of the skull through the vertex without intravenous contrast. COMPARISON:  10/28/2014 CT FINDINGS: Brain: No evidence of acute infarction, hemorrhage, hydrocephalus, extra-axial collection or mass lesion/mass effect. Atrophy and chronic small-vessel white matter ischemic changes again noted. Vascular: Atherosclerotic calcifications noted Skull: Normal. Negative for fracture or focal lesion. Sinuses/Orbits: No acute finding. Other: None IMPRESSION: 1. No evidence of acute intracranial abnormality 2. Atrophy and chronic small-vessel white matter ischemic changes Electronically Signed   By: Margarette Canada M.D.   On: 10/10/2017 15:11   Dg Hand 2 View Left  Result Date: 10/11/2017 CLINICAL DATA:  82 year old female with intermediate pain and swelling of the left hand. EXAM: LEFT HAND - 2 VIEW COMPARISON:  None. FINDINGS: There is no acute fracture or dislocation. The bones are osteopenic. There are arthritic changes of the interphalangeal joints as well as arthritic changes of the base of the thumb. The soft tissues appear unremarkable. IMPRESSION: No acute fracture or dislocation. Electronically Signed   By: Anner Crete M.D.   On: 10/11/2017 23:39   Dg Chest Port 1 View  Result Date: 10/16/2017 CLINICAL DATA:  Respiratory failure. EXAM:  PORTABLE CHEST 1 VIEW COMPARISON:  10/15/2017 and 10/10/2017 FINDINGS: The patient has developed slight pulmonary vascular congestion and slight interstitial accentuation since the prior study. Overall heart size is normal. AICD in place. No effusions. No acute bone abnormality. IMPRESSION: New slight pulmonary vascular congestion. Interstitial accentuation probably represents mild pulmonary edema. Electronically Signed   By: Lorriane Shire M.D.   On: 10/16/2017 10:33   Dg Chest Portable 1 View  Result Date: 10/15/2017  CLINICAL DATA:  Tachycardia EXAM: PORTABLE CHEST 1 VIEW COMPARISON:  10/10/2017 FINDINGS: Left-sided ICD device. Mild cardiomegaly. No focal consolidation or effusion. No pneumothorax. IMPRESSION: Mild cardiomegaly.  Negative for edema or focal infiltrate. Electronically Signed   By: Donavan Foil M.D.   On: 10/15/2017 15:21   Dg Chest Portable 1 View  Result Date: 10/10/2017 CLINICAL DATA:  L status change, fever, recent urinary tract infection. History of hypertension, CHF, tachycardia, bone marrow transplant for lymphoma. Never smoked. EXAM: PORTABLE CHEST 1 VIEW COMPARISON:  Chest x-ray of February 08, 2015. FINDINGS: The lungs are adequately inflated. The interstitial markings are mildly prominent but less conspicuous than on the earlier study. There is no alveolar infiltrate or pleural effusion. The cardiac silhouette is mildly enlarged. The central pulmonary vascularity is normal. The ICD is in stable position. The bony thorax exhibits no acute abnormality. IMPRESSION: Mild cardiomegaly with mild central pulmonary vascular prominence but decreased prominence of the pulmonary interstitium since the previous study. No acute pneumonia. Electronically Signed   By: David  Martinique M.D.   On: 10/10/2017 15:36   Dg Foot Complete Right  Result Date: 10/11/2017 CLINICAL DATA:  Right foot pain today. EXAM: RIGHT FOOT COMPLETE - 3+ VIEW COMPARISON:  None. FINDINGS: Possible nondisplaced fracture  at the base of the fifth metatarsal. Examination technically limited due to difficulties with positioning. No additional acute fracture. Diffuse osteopenia/ osteoporosis. Hammertoe deformity of the digits. Mild osteoarthritis of the first metatarsal phalangeal joint. IMPRESSION: Possible nondisplaced fracture at the base of the fifth metatarsal. Recommend correlation for focal tenderness. Osseous under mineralization. Electronically Signed   By: Jeb Levering M.D.   On: 10/11/2017 06:45   Dg Hip Unilat With Pelvis 1v Right  Result Date: 10/16/2017 CLINICAL DATA:  Right hip pain. EXAM: DG HIP (WITH OR WITHOUT PELVIS) 1V RIGHT COMPARISON:  Radiographs dated 10/10/2017 and CT scan dated 09/05/2017 FINDINGS: There has been no change since the prior exams. Again noted is previous resection of proximal femur with evidence of a removed hip prosthesis. There is marked deformity and sclerosis of the right acetabulum. Dystrophic calcifications and probable methylmethacrylate remnants are noted, unchanged. There is no new bone destruction or fracture. The pelvic bones otherwise appear normal. IMPRESSION: No acute abnormality. No change since the prior exams. 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 Electronically Signed   By: Lorriane Shire M.D.   On: 10/16/2017 07:09   Dg Hip Unilat With Pelvis 2-3 Views Right  Result Date: 10/10/2017 CLINICAL DATA:  Pain in the right hip EXAM: DG HIP (WITH OR WITHOUT PELVIS) 2-3V RIGHT COMPARISON:  CT 09/05/2017 FINDINGS: No fracture or dislocation involving the left hip. The pubic symphysis is intact. Extensive chronic deformity of the right hip with remodeling and diffuse thinning of the right acetabulum with mixed lucency and scleroses, grossly similar to the prior CT from November allowing for limitations of inter modality comparison. There is been prior resection of the right femoral head and neck. Slight irregularity at the cut surface probably stable compared to  the recent CT. Multiple calcifications or osseous fragments projecting over the right hip and inferior pelvis. No gross fracture is seen. Cerclage wires at the proximal femur and over the right hip. Femoral shaft appears osteopenic. IMPRESSION: 1. Extensive chronic deformity of the right acetabulum with evidence of prior resection of the right femoral head and neck. There is no gross fracture seen. Electronically Signed   By: Donavan Foil M.D.   On: 10/10/2017 17:10  EKG: Normal sinus rhythm with first-degree AV block and frequent preventricular contractions with nonspecific ST changes  Weights: Filed Weights   10/15/17 1427 10/15/17 1434 10/16/17 0100  Weight: 63.5 kg (140 lb) 64.4 kg (142 lb) 68.5 kg (151 lb 0.2 oz)     Physical Exam: Blood pressure (!) 155/76, pulse (!) 107, temperature 98.3 F (36.8 C), resp. rate 20, height 5\' 5"  (1.651 m), weight 68.5 kg (151 lb 0.2 oz), SpO2 91 %. Body mass index is 25.13 kg/m. General: Well developed, well nourished, in no acute distress. Head eyes ears nose throat: Normocephalic, atraumatic, sclera non-icteric, no xanthomas, nares are without discharge. No apparent thyromegaly and/or mass  Lungs: Normal respiratory effort.  no wheezes, no rales, no rhonchi.  Heart: RRR with normal S1 S2. no murmur gallop, no rub, PMI is normal size and placement, carotid upstroke normal without bruit, jugular venous pressure is normal Abdomen: Soft, non-tender, non-distended with normoactive bowel sounds. No hepatomegaly. No rebound/guarding. No obvious abdominal masses. Abdominal aorta is normal size without bruit Extremities: No edema. no cyanosis, no clubbing, no ulcers  Peripheral : 2+ bilateral upper extremity pulses, 2+ bilateral femoral pulses, 2+ bilateral dorsal pedal pulse Neuro: Alert and oriented. No facial asymmetry. No focal deficit. Moves all extremities spontaneously. Musculoskeletal: Normal muscle tone without kyphosis Psych:  Responds to  questions appropriately with a normal affect.    Assessment: 82 year old female with chronic systolic dysfunction congestive heart failure paroxysmal nonvalvular atrial fibrillation coronary atherosclerosis with previous myocardial infarction on appropriate medications with MRSA bacteremia  Plan: 1.  Continue medication management and antibiotic for MRSA bacteremia 2.  Digoxin for heart rate control and maintenance of normal sinus rhythm 3.  No change in Eliquis for further risk reduction and stroke with atrial fibrillation 4.  High intensity cholesterol therapy with atorvastatin 5.  Echocardiogram for LV systolic dysfunction valvular heart disease for risk stratification prior to further treatment below 6.  Full extraction and removal of defibrillator and wire device due to MRSA bacteremia with further treatment thereafter as per infectious disease  Signed, Corey Skains M.D. [facility] 10/17/2017, 12:45 PM

## 2017-10-17 NOTE — Clinical Social Work Note (Signed)
Patient will be transferring to [facility] today, CSW to sign off.  Jones Broom. [place], MSW, [place]  10/17/2017 5:28 PM

## 2017-10-17 NOTE — Progress Notes (Signed)
    [facility]    Date of Admission:  10/15/2017   Total days of antibiotics 6/ day 3 vancomycin  ID: Mindy Cortez is a 82 y.o. female with  Principal Problem:   MRSA bacteremia Active Problems:   Sepsis ([place])   Pressure injury of skin   Septic shock ([place])   Right lower quadrant abdominal pain         Lab Results Recent Labs    10/16/17 0506 10/17/17 0325  WBC 20.7* 26.1*  HGB 9.1* 9.8*  HCT 28.7* 30.2*  NA 134* 136  K 3.1* 4.0  CL 107 111  CO2 19* 19*  BUN 16 22*  CREATININE 0.85 0.67   Liver Panel Recent Labs    10/15/17 1903 10/17/17 0325  PROT 6.4* 5.9*  ALBUMIN 2.0* 1.8*  AST 39 33  ALT 13* 16  ALKPHOS 120 113  BILITOT 1.2 0.8    Microbiology: 12/26 blood cx ngtd Studies/Results: Dg Chest Port 1 View  Result Date: 10/16/2017 CLINICAL DATA:  Respiratory failure. EXAM: PORTABLE CHEST 1 VIEW COMPARISON:  10/15/2017 and 10/10/2017 FINDINGS: The patient has developed slight pulmonary vascular congestion and slight interstitial accentuation since the prior study. Overall heart size is normal. AICD in place. No effusions. No acute bone abnormality. IMPRESSION: New slight pulmonary vascular congestion. Interstitial accentuation probably represents mild pulmonary edema. Electronically Signed   By: Lorriane Shire M.D.   On: 10/16/2017 10:33   Dg Chest Portable 1 View  Result Date: 10/15/2017 CLINICAL DATA:  Tachycardia EXAM: PORTABLE CHEST 1 VIEW COMPARISON:  10/10/2017 FINDINGS: Left-sided ICD device. Mild cardiomegaly. No focal consolidation or effusion. No pneumothorax. IMPRESSION: Mild cardiomegaly.  Negative for edema or focal infiltrate. Electronically Signed   By: Donavan Foil M.D.   On: 10/15/2017 15:21   Dg Hip Unilat With Pelvis 1v Right  Result Date: 10/16/2017 CLINICAL DATA:  Right hip pain. EXAM: DG HIP (WITH OR WITHOUT PELVIS) 1V RIGHT COMPARISON:  Radiographs dated 10/10/2017 and CT scan dated 09/05/2017 FINDINGS:  There has been no change since the prior exams. Again noted is previous resection of proximal femur with evidence of a removed hip prosthesis. There is marked deformity and sclerosis of the right acetabulum. Dystrophic calcifications and probable methylmethacrylate remnants are noted, unchanged. There is no new bone destruction or fracture. The pelvic bones otherwise appear normal. IMPRESSION: No acute abnormality. No change since the prior exams. 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 Electronically Signed   By: Lorriane Shire M.D.   On: 10/16/2017 07:09     Assessment/Plan:  Sepsis due to MRSA bacteremia and DFU = continue on vancomycin. Await TTE, but ultimately should get TEE since she has an AICD in place to rule out cardiac device-endocarditis.  Repeat blood cx are negative at 24hr. Hold on placing picc line. Will need to get vanco trough to check if therapeutic. Will continue to follow  [facility] Cell: [phone] Pager: [phone]  10/17/2017, 10:11 AM

## 2017-10-17 NOTE — Discharge Summary (Signed)
[facility] NAME: Polina Burmaster    MR#:  [id number]  DATE OF BIRTH:  24-Nov-1934  DATE OF ADMISSION:  10/15/2017 ADMITTING PHYSICIAN: Nicholes Mango, MD  DATE OF DISCHARGE: 10/17/2017   PRIMARY CARE PHYSICIAN: Marinda Elk, MD    ADMISSION DIAGNOSIS:  Sepsis, due to unspecified organism ([place]) [A41.9]  DISCHARGE DIAGNOSIS:  Principal Problem:   MRSA bacteremia Active Problems:   Sepsis ([place])   Pressure injury of skin   Septic shock ([place])   Right lower quadrant abdominal pain   SECONDARY DIAGNOSIS:   Past Medical History:  Diagnosis Date  . AICD (automatic cardioverter/defibrillator) present    non-functioning due to no active battery in place  . Atrial fibrillation ([place])   . CHF (congestive heart failure) ([place])   . Depression   . 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 HTN (hypertension)   . Hypertension   . Neuropathy   . Nocturia   . Non-Hodgkin lymphoma ([place])    Bone Marrow Transplant with chemo + Rad tx's.  . Tachycardia   . Urge incontinence   . Urinary incontinence     HOSPITAL COURSE:   GailCooperis a22 y.o.femalewith a known history of hypertension, neuropathy, non-Hodgkin's lymphoma status post bone marrow transplantation, chronic systolic congestive heart failure with ejection fraction 25% was recently admitted to the hospital with a diagnosis of sepsis and discharged on December 22 with a discharge diagnosis of right toe ulcer and patient was sent home with the clindamycin,patient is compliant with the medication is brought into the hospital for altered mental status from assisted living facility by her daughter.Patient was more confused and having generalized weakness for the past 3-4 days. In the ED she was found to be hypotensive, tachycardic and white count is elevated  #Acute encephalopathy secondary to sepsis-  bacteremia Transferred to Step down unit overnight due to hypotension and high lactic acid. Met septic criteria with hypotension, tachycardia, leukocytosis and elevated lactic acid Patient is started on broad-spectrum IV antibiotics aztreonam, levofloxacin and vancomycin per protocol as patient is allergic to penicillin IV fluids Blood cultures, urine cultures are ordered- MRSA in bl cx- cont vanc only. Repeat bl cx after 2 doses of vanc is negative so far. Will continue wound care of the second 2toe  Appreciated ID and Cardiology help. Cardio suggest removal of AICD. Spoke to [facility] transfer center and pt is accepted for further care.   #Essential hypertension Currently patient's blood pressure is soft. Held antihypertensives on admission. Now BP stable and have some tachycardia up to 100, so start low dose metoprolol.  #Chronic non-Hodgkin's lymphoma continue follow-up with cancer Center for surveillance as recommended  #Paroxysmal atrial fibrillation Patient was tachycardic but with IV fluids heart rate is improving Continue home medication digoxin andeliquis   # Ch systolic CHF- EF 76%   Stable, monitor.  #Chronic history of dementia continue donepezil    DISCHARGE CONDITIONS:   Stable.  CONSULTS OBTAINED:  Treatment Team:  Corey Skains, MD  DRUG ALLERGIES:   Allergies  Allergen Reactions  . Penicillin V Potassium Hives    Has patient had a PCN reaction causing immediate rash, facial/tongue/throat swelling, SOB or lightheadedness with hypotension: Unknown Has patient had a PCN reaction causing severe rash involving mucus membranes or skin necrosis: Unknown Has patient had a PCN reaction that required hospitalization: Unknown Has patient had a PCN reaction occurring within  the last 10 years: Unknown If all of the above answers are "NO", then may proceed with Cephalosporin use.   Marland Kitchen Prochlorperazine Nausea And Vomiting    DIZZINESS, DRUNK  FEELING. DIZZINESS, DRUNK FEELING.  . Ramipril Other (See Comments)    SEVERE HEADACHE SEVERE HEADACHE  . Rifampin     Severe stomach irriatates cramping and nausea  . Sulfa Antibiotics     TOLD AS A CHILD, MADE PT NERVOUS, EMOTIONAL, TOLD NOT TO TAKE.  . Cephalexin Rash    RASH, ITCHING. RASH, ITCHING.    DISCHARGE MEDICATIONS:   Allergies as of 10/17/2017      Reactions   Penicillin V Potassium Hives   Has patient had a PCN reaction causing immediate rash, facial/tongue/throat swelling, SOB or lightheadedness with hypotension: Unknown Has patient had a PCN reaction causing severe rash involving mucus membranes or skin necrosis: Unknown Has patient had a PCN reaction that required hospitalization: Unknown Has patient had a PCN reaction occurring within the last 10 years: Unknown If all of the above answers are "NO", then may proceed with Cephalosporin use.   Prochlorperazine Nausea And Vomiting   DIZZINESS, DRUNK FEELING. DIZZINESS, DRUNK FEELING.   Ramipril Other (See Comments)   SEVERE HEADACHE SEVERE HEADACHE   Rifampin    Severe stomach irriatates cramping and nausea   Sulfa Antibiotics    TOLD AS A CHILD, MADE PT NERVOUS, EMOTIONAL, TOLD NOT TO TAKE.   Cephalexin Rash   RASH, ITCHING. RASH, ITCHING.      Medication List    STOP taking these medications   clindamycin 300 MG capsule Commonly known as:  CLEOCIN     TAKE these medications   acetaminophen 325 MG tablet Commonly known as:  TYLENOL Take 650 mg by mouth every 6 (six) hours as needed.   aspirin EC 81 MG tablet Take 1 tablet (81 mg total) by mouth daily.   atorvastatin 20 MG tablet Commonly known as:  LIPITOR Take 1 tablet (20 mg total) by mouth daily.   cholecalciferol 1000 units tablet Commonly known as:  VITAMIN D Take 1,000 Units by mouth daily.   digoxin 0.125 MG tablet Commonly known as:  LANOXIN Take 0.125 mg by mouth daily.   donepezil 10 MG tablet Commonly known as:   ARICEPT Take 10 mg by mouth at bedtime.   DULoxetine 30 MG capsule Commonly known as:  CYMBALTA Take 3 capsules (90 mg total) by mouth daily. Start taking on:  10/18/2017 What changed:    medication strength  how much to take  Another medication with the same name was removed. Continue taking this medication, and follow the directions you see here.   ELIQUIS 5 MG Tabs tablet Generic drug:  apixaban Take 5 mg by mouth 2 (two) times daily.   furosemide 20 MG tablet Commonly known as:  LASIX Take 20 mg by mouth daily as needed for edema.   loperamide 2 MG capsule Commonly known as:  IMODIUM Take 2 mg by mouth every 6 (six) hours as needed for diarrhea or loose stools.   losartan 25 MG tablet Commonly known as:  COZAAR Take 25 mg by mouth daily.   Melatonin 3 MG Tbdp Take 3 mg by mouth at bedtime.   metoprolol tartrate 25 MG tablet Commonly known as:  LOPRESSOR Take 0.5 tablets (12.5 mg total) by mouth 2 (two) times daily.   multivitamin with minerals Tabs tablet Take 1 tablet by mouth daily.   Vancomycin 750-5 MG/150ML-% Soln Commonly known as:  VANCOCIN Inject 150 mLs (750 mg total) into the vein every 12 (twelve) hours. Start taking on:  10/18/2017   vitamin B-12 1000 MCG tablet Commonly known as:  CYANOCOBALAMIN Take 1,000 mcg by mouth daily.        DISCHARGE INSTRUCTIONS:    Follow at [facility] cardiology.  If you experience worsening of your admission symptoms, develop shortness of breath, life threatening emergency, suicidal or homicidal thoughts you must seek medical attention immediately by calling 911 or calling your MD immediately  if symptoms less severe.  You Must read complete instructions/literature along with all the possible adverse reactions/side effects for all the Medicines you take and that have been prescribed to you. Take any new Medicines after you have completely understood and accept all the possible adverse reactions/side effects.    Please note  You were cared for by a hospitalist during your hospital stay. If you have any questions about your discharge medications or the care you received while you were in the hospital after you are discharged, you can call the unit and asked to speak with the hospitalist on call if the hospitalist that took care of you is not available. Once you are discharged, your primary care physician will handle any further medical issues. Please note that NO REFILLS for any discharge medications will be authorized once you are discharged, as it is imperative that you return to your primary care physician (or establish a relationship with a primary care physician if you do not have one) for your aftercare needs so that they can reassess your need for medications and monitor your lab values.    Today   CHIEF COMPLAINT:   Chief Complaint  Patient presents with  . Tachycardia    HISTORY OF PRESENT ILLNESS:  Mindy Cortez  is a 82 y.o. female with a known history of hypertension, neuropathy, non-Hodgkin's lymphoma status post bone marrow transplantation, chronic systolic congestive heart failure with ejection fraction 25% was recently admitted to the hospital with a diagnosis of sepsis and discharged on December 22 with a discharge diagnosis of right toe ulcer and patient was sent home with the clindamycin, patient is compliant with the medication is brought into the hospital for altered mental status from assisted living facility by her daughter. Patient was more confused and having generalized weakness for the past 3-4 days. In the ED she was found to be hypotensive, tachycardic and white count is elevated at 26,000 and at the time of discharge during the previous admission it was at around 17,000. Chest x-ray is unremarkable. Patient is started on broad spectrum IV antibiotics and hospitalist team is called to admit the patient. Patient was given fluid boluses and slightly feeling better according to the  daughter at bedside   VITAL SIGNS:  Blood pressure (!) 155/76, pulse (!) 107, temperature 98.3 F (36.8 C), resp. rate 20, height _0  (1.651 m), weight 68.5 kg (151 lb 0.2 oz), SpO2 91 %.  I/O:    Intake/Output Summary (Last 24 hours) at 10/17/2017 1452 Last data filed at 10/17/2017 0904 Gross per 24 hour  Intake 300 ml  Output 650 ml  Net -350 ml    PHYSICAL EXAMINATION:   GENERAL:82 y.o.-year-old patient lying in the bed with no acute distress.  EYES: Pupils equal, round, reactive to light and accommodation. No scleral icterus. Extraocular muscles intact.  HEENT: Head atraumatic, normocephalic. Oropharynx and nasopharynx clear.  NECK: Supple, no jugular venous distention. No thyroid enlargement, no tenderness.  LUNGS: Normal breath  sounds bilaterally, no wheezing, rales,rhonchi or crepitation. No use of accessory muscles of respiration.  CARDIOVASCULAR: S1, S2 normal. No murmurs, rubs, or gallops. AICD in place. ABDOMEN: Soft, nontender, nondistended. Bowel sounds present. No organomegaly or mass.  EXTREMITIES:Right foot second toe ulcer is healing.No pedal edema, cyanosis, or clubbing.  NEUROLOGIC:Patient is awake and alert but oriented 3. Sensation intact. Gait not checked. Moves all 4 limbs PSYCHIATRIC: The patient is alert and oriented x3 SKIN: No obvious rash, lesion    DATA REVIEW:   CBC Recent Labs  Lab 10/17/17 0325  WBC 26.1*  HGB 9.8*  HCT 30.2*  PLT 201    Chemistries  Recent Labs  Lab 10/12/17 0340  10/17/17 0325  NA 136   < > 136  K 3.1*   < > 4.0  CL 106   < > 111  CO2 23   < > 19*  GLUCOSE 104*   < > 121*  BUN 12   < > 22*  CREATININE 0.71   < > 0.67  CALCIUM 8.2*   < > 7.6*  MG 1.6*  --   --   AST  --    < > 33  ALT  --    < > 16  ALKPHOS  --    < > 113  BILITOT  --    < > 0.8   < > = values in this interval not displayed.    Cardiac Enzymes No results for input(s): TROPONINI in the last 168 hours.  Microbiology Results   Results for orders placed or performed during the hospital encounter of 10/15/17  Blood culture (routine x 2)     Status: Abnormal (Preliminary result)   Collection Time: 10/15/17  2:39 PM  Result Value Ref Range Status   Specimen Description   Final    BLOOD RIGHT ANTECUBITAL Performed at [facility], [address], [place], [place] [address]    Special Requests   Final    BOTTLES DRAWN AEROBIC AND ANAEROBIC Blood Culture adequate volume Performed at [facility], [address] [place], [place] [address]    Culture  Setup Time   Final    GRAM POSITIVE COCCI IN BOTH AEROBIC AND ANAEROBIC BOTTLES CRITICAL RESULT CALLED TO, READ BACK BY AND VERIFIED WITH: KRISTEN MERRILL AT 8469 ON 10/16/17 [place].    Culture (A)  Final    STAPHYLOCOCCUS AUREUS SUSCEPTIBILITIES TO FOLLOW Performed at [facility], [place] [address], [place], [place] [address]    Report Status PENDING  Incomplete  Blood Culture ID Panel (Reflexed)     Status: Abnormal   Collection Time: 10/15/17  2:39 PM  Result Value Ref Range Status   Enterococcus species NOT DETECTED NOT DETECTED Final   Listeria monocytogenes NOT DETECTED NOT DETECTED Final   Staphylococcus species DETECTED (A) NOT DETECTED Final    Comment: CRITICAL RESULT CALLED TO, READ BACK BY AND VERIFIED WITH: KRISTEN MERRILL AT 8413 ON 10/16/17 [place].    Staphylococcus aureus DETECTED (A) NOT DETECTED Final    Comment: Methicillin (oxacillin)-resistant Staphylococcus aureus (MRSA). MRSA is predictably resistant to beta-lactam antibiotics (except ceftaroline). Preferred therapy is vancomycin unless clinically contraindicated. Patient requires contact precautions if  hospitalized. CRITICAL RESULT CALLED TO, READ BACK BY AND VERIFIED WITH: KRISTEN MERRILL AT 2440 ON 10/16/17 [place].    Methicillin resistance DETECTED (A) NOT DETECTED Final    Comment: CRITICAL RESULT CALLED TO, READ BACK BY AND VERIFIED WITH: KRISTEN MERRILL AT 1027  ON 10/16/17 [place].    Streptococcus species NOT DETECTED NOT DETECTED Final   Streptococcus agalactiae NOT DETECTED NOT DETECTED Final   Streptococcus pneumoniae NOT DETECTED NOT DETECTED Final   Streptococcus pyogenes NOT DETECTED NOT DETECTED Final   Acinetobacter baumannii NOT DETECTED NOT DETECTED Final   Enterobacteriaceae species NOT DETECTED NOT DETECTED Final   Enterobacter cloacae complex NOT DETECTED NOT DETECTED Final   Escherichia coli NOT DETECTED NOT DETECTED Final   Klebsiella oxytoca NOT DETECTED NOT DETECTED Final   Klebsiella pneumoniae NOT DETECTED NOT DETECTED Final   Proteus species NOT DETECTED NOT DETECTED Final   Serratia marcescens NOT DETECTED NOT DETECTED Final   Haemophilus influenzae NOT DETECTED NOT DETECTED Final   Neisseria meningitidis NOT DETECTED NOT DETECTED Final   Pseudomonas aeruginosa NOT DETECTED NOT DETECTED Final   Candida albicans NOT DETECTED NOT DETECTED Final   Candida glabrata NOT DETECTED NOT DETECTED Final   Candida krusei NOT DETECTED NOT DETECTED Final   Candida parapsilosis NOT DETECTED NOT DETECTED Final   Candida tropicalis NOT DETECTED NOT DETECTED Final    Comment: Performed at [facility], [address] [place], [place] [address]  Blood culture (routine x 2)     Status: Abnormal (Preliminary result)   Collection Time: 10/15/17  3:25 PM  Result Value Ref Range Status   Specimen Description   Final    BLOOD BLOOD RIGHT HAND Performed at [facility], [address], [place], [place] [address]    Special Requests   Final    BOTTLES DRAWN AEROBIC AND ANAEROBIC Blood Culture results may not be optimal due to an excessive volume of blood received in culture bottles Performed at [facility], [address] [place], [place] [address]    Culture  Setup Time   Final    GRAM POSITIVE COCCI IN BOTH AEROBIC AND ANAEROBIC BOTTLES CRITICAL RESULT CALLED TO, READ BACK BY AND VERIFIED WITH: KRISTIN MERRILL  AT 4315 10/16/17 [place]    Culture (A)  Final    STAPHYLOCOCCUS AUREUS SUSCEPTIBILITIES TO FOLLOW Performed at [facility], [place] [address] [place], [place] [address]    Report Status PENDING  Incomplete  Urine culture     Status: None   Collection Time: 10/15/17  5:00 PM  Result Value Ref Range Status   Specimen Description   Final    URINE, RANDOM Performed at [facility], [address] [place], [place] [address]    Special Requests   Final    NONE Performed at [facility], [address], [place], [place] [address]    Culture   Final    NO GROWTH Performed at [facility], [place] [address] [place], [place] [address]    Report Status 10/16/2017 FINAL  Final  Culture, blood (routine x 2)     Status: None (Preliminary result)   Collection Time: 10/16/17  1:23 PM  Result Value Ref Range Status   Specimen Description BLOOD RIGHT HAND   Final   Special Requests   Final    BOTTLES DRAWN AEROBIC AND ANAEROBIC Blood Culture adequate volume   Culture   Final    NO GROWTH < 24 HOURS Performed at [facility], [address] [place], [place] [address]    Report Status PENDING  Incomplete  Culture, blood (routine x 2)     Status: None (Preliminary result)   Collection Time: 10/16/17  1:23 PM  Result Value Ref Range Status  Specimen Description BLOOD RIGHT WRIST  Final   Special Requests   Final    BOTTLES DRAWN AEROBIC AND ANAEROBIC Blood Culture results may not be optimal due to an inadequate volume of blood received in culture bottles   Culture  Setup Time   Final    GRAM POSITIVE COCCI ANAEROBIC BOTTLE ONLY CRITICAL RESULT CALLED TO, READ BACK BY AND VERIFIED WITH: SHEEMA HALLAJI ON 10/17/17 AT 1152 QSD Performed at [facility], [address], [place], [place] [address]    Culture [facility] POSITIVE COCCI  Final   Report Status PENDING  Incomplete    RADIOLOGY:  Dg Chest Port 1 View  Result Date: 10/16/2017 CLINICAL  DATA:  Respiratory failure. EXAM: PORTABLE CHEST 1 VIEW COMPARISON:  10/15/2017 and 10/10/2017 FINDINGS: The patient has developed slight pulmonary vascular congestion and slight interstitial accentuation since the prior study. Overall heart size is normal. AICD in place. No effusions. No acute bone abnormality. IMPRESSION: New slight pulmonary vascular congestion. Interstitial accentuation probably represents mild pulmonary edema. Electronically Signed   By: Lorriane Shire M.D.   On: 10/16/2017 10:33   Dg Chest Portable 1 View  Result Date: 10/15/2017 CLINICAL DATA:  Tachycardia EXAM: PORTABLE CHEST 1 VIEW COMPARISON:  10/10/2017 FINDINGS: Left-sided ICD device. Mild cardiomegaly. No focal consolidation or effusion. No pneumothorax. IMPRESSION: Mild cardiomegaly.  Negative for edema or focal infiltrate. Electronically Signed   By: Donavan Foil M.D.   On: 10/15/2017 15:21   Dg Hip Unilat With Pelvis 1v Right  Result Date: 10/16/2017 CLINICAL DATA:  Right hip pain. EXAM: DG HIP (WITH OR WITHOUT PELVIS) 1V RIGHT COMPARISON:  Radiographs dated 10/10/2017 and CT scan dated 09/05/2017 FINDINGS: There has been no change since the prior exams. Again noted is previous resection of proximal femur with evidence of a removed hip prosthesis. There is marked deformity and sclerosis of the right acetabulum. Dystrophic calcifications and probable methylmethacrylate remnants are noted, unchanged. There is no new bone destruction or fracture. The pelvic bones otherwise appear normal. IMPRESSION: No acute abnormality. No change since the prior exams. 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 Electronically Signed   By: Lorriane Shire M.D.   On: 10/16/2017 07:09    EKG:   Orders placed or performed during the hospital encounter of 10/15/17  . ED EKG  . ED EKG  . EKG 12-Lead  . EKG 12-Lead    Management plans discussed with the patient, family and they are in agreement.  CODE STATUS:  FUll.    Code  Status Orders  (From admission, onward)        Start     Ordered   10/15/17 1745  Full code  Continuous     10/15/17 1744    Code Status History    Date Active Date Inactive Code Status Order ID Comments User Context   10/10/2017 20:11 10/12/2017 18:34 Full Code [id number]  Demetrios Loll, MD Inpatient   06/18/2016 12:53 06/18/2016 18:33 Full Code [id number]  Algernon Huxley, MD Inpatient    Advance Directive Documentation     Most Recent Value  Type of Advance Directive  Healthcare Power of Attorney  Pre-existing out of facility DNR order (yellow form or pink MOST form)  No data  "MOST" Form in Place?  No data      TOTAL TIME TAKING CARE OF THIS PATIENT: 35 minutes.    Vaughan Basta M.D on 10/17/2017 at 2:52 PM  Between 7am to 6pm - Pager - [phone]  After 6pm go to [email or web address] - password [email or web address] [facility]  Office  [phone]  CC: Primary care physician; Marinda Elk, MD   Note: This dictation was prepared with Dragon dictation along with smaller phrase technology. Any transcriptional errors that result from this process are unintentional.

## 2017-10-17 NOTE — Progress Notes (Signed)
Report called to [facility] / verbalized an understanding/ EMS called to transport .

## 2017-10-17 NOTE — Progress Notes (Signed)
[facility] [place] NAME: Mindy Cortez    MR#:  [id number]  DATE OF BIRTH:  May 24, 1935  SUBJECTIVE:  CHIEF COMPLAINT:   Chief Complaint  Patient presents with  . Tachycardia     Sent back from rehab after recent d./c for sepsis, cellulitis. Now noted for bacteremia.  REVIEW OF SYSTEMS:  CONSTITUTIONAL: No fever, fatigue or weakness.  EYES: No blurred or double vision.  EARS, NOSE, AND THROAT: No tinnitus or ear pain.  RESPIRATORY: No cough, shortness of breath, wheezing or hemoptysis.  CARDIOVASCULAR: No chest pain, orthopnea, edema.  GASTROINTESTINAL: No nausea, vomiting, diarrhea or abdominal pain.  GENITOURINARY: No dysuria, hematuria.  ENDOCRINE: No polyuria, nocturia,  HEMATOLOGY: No anemia, easy bruising or bleeding SKIN: No rash or lesion. MUSCULOSKELETAL: No joint pain or arthritis.   NEUROLOGIC: No tingling, numbness, weakness.  PSYCHIATRY: No anxiety or depression.   ROS  DRUG ALLERGIES:   Allergies  Allergen Reactions  . Penicillin V Potassium Hives    Has patient had a PCN reaction causing immediate rash, facial/tongue/throat swelling, SOB or lightheadedness with hypotension: Unknown Has patient had a PCN reaction causing severe rash involving mucus membranes or skin necrosis: Unknown Has patient had a PCN reaction that required hospitalization: Unknown Has patient had a PCN reaction occurring within the last 10 years: Unknown If all of the above answers are "NO", then may proceed with Cephalosporin use.   Marland Kitchen Prochlorperazine Nausea And Vomiting    DIZZINESS, DRUNK FEELING. DIZZINESS, DRUNK FEELING.  . Ramipril Other (See Comments)    SEVERE HEADACHE SEVERE HEADACHE  . Rifampin     Severe stomach irriatates cramping and nausea  . Sulfa Antibiotics     TOLD AS A CHILD, MADE PT NERVOUS, EMOTIONAL, TOLD NOT TO TAKE.  . Cephalexin Rash    RASH, ITCHING. RASH, ITCHING.    VITALS:  Blood pressure 102/69, pulse (!)  102, temperature 98.3 F (36.8 C), resp. rate 20, height _0  (1.651 m), weight 68.5 kg (151 lb 0.2 oz), SpO2 91 %.  PHYSICAL EXAMINATION:   GENERAL:  82 y.o.-year-old patient lying in the bed with no acute distress.  EYES: Pupils equal, round, reactive to light and accommodation. No scleral icterus. Extraocular muscles intact.  HEENT: Head atraumatic, normocephalic. Oropharynx and nasopharynx clear.  NECK:  Supple, no jugular venous distention. No thyroid enlargement, no tenderness.  LUNGS: Normal breath sounds bilaterally, no wheezing, rales,rhonchi or crepitation. No use of accessory muscles of respiration.  CARDIOVASCULAR: S1, S2 normal. No murmurs, rubs, or gallops. AICD in place. ABDOMEN: Soft, nontender, nondistended. Bowel sounds present. No organomegaly or mass.  EXTREMITIES: Right foot second toe ulcer is healing. No pedal edema, cyanosis, or clubbing.  NEUROLOGIC: Patient is awake and alert but oriented 3. Sensation intact. Gait not checked.  PSYCHIATRIC: The patient is alert and oriented x3 SKIN: No obvious rash, lesion   Physical Exam LABORATORY PANEL:   CBC Recent Labs  Lab 10/17/17 0325  WBC 26.1*  HGB 9.8*  HCT 30.2*  PLT 201   ------------------------------------------------------------------------------------------------------------------  Chemistries  Recent Labs  Lab 10/12/17 0340  10/17/17 0325  NA 136   < > 136  K 3.1*   < > 4.0  CL 106   < > 111  CO2 23   < > 19*  GLUCOSE 104*   < > 121*  BUN 12   < > 22*  CREATININE 0.71   < > 0.67  CALCIUM 8.2*   < >  7.6*  MG 1.6*  --   --   AST  --    < > 33  ALT  --    < > 16  ALKPHOS  --    < > 113  BILITOT  --    < > 0.8   < > = values in this interval not displayed.   ------------------------------------------------------------------------------------------------------------------  Cardiac Enzymes No results for input(s): TROPONINI in the last 168  hours. ------------------------------------------------------------------------------------------------------------------  RADIOLOGY:  Dg Chest Port 1 View  Result Date: 10/16/2017 CLINICAL DATA:  Respiratory failure. EXAM: PORTABLE CHEST 1 VIEW COMPARISON:  10/15/2017 and 10/10/2017 FINDINGS: The patient has developed slight pulmonary vascular congestion and slight interstitial accentuation since the prior study. Overall heart size is normal. AICD in place. No effusions. No acute bone abnormality. IMPRESSION: New slight pulmonary vascular congestion. Interstitial accentuation probably represents mild pulmonary edema. Electronically Signed   By: Lorriane Shire M.D.   On: 10/16/2017 10:33   Dg Hip Unilat With Pelvis 1v Right  Result Date: 10/16/2017 CLINICAL DATA:  Right hip pain. EXAM: DG HIP (WITH OR WITHOUT PELVIS) 1V RIGHT COMPARISON:  Radiographs dated 10/10/2017 and CT scan dated 09/05/2017 FINDINGS: There has been no change since the prior exams. Again noted is previous resection of proximal femur with evidence of a removed hip prosthesis. There is marked deformity and sclerosis of the right acetabulum. Dystrophic calcifications and probable methylmethacrylate remnants are noted, unchanged. There is no new bone destruction or fracture. The pelvic bones otherwise appear normal. IMPRESSION: No acute abnormality. No change since the prior exams. 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 Electronically Signed   By: Lorriane Shire M.D.   On: 10/16/2017 07:09    ASSESSMENT AND PLAN:   Principal Problem:   MRSA bacteremia Active Problems:   Sepsis ([place])   Pressure injury of skin   Septic shock ([place])   Right lower quadrant abdominal pain  Mindy Cortez  is a 82 y.o. female with a known history of  hypertension, neuropathy, non-Hodgkin's lymphoma status post bone marrow transplantation, chronic systolic congestive heart failure with ejection fraction 25% was recently admitted to the  hospital with a diagnosis of sepsis and discharged on December 22 with a discharge diagnosis of right toe ulcer and patient was sent home with the clindamycin, patient is compliant with the medication is brought into the hospital for altered mental status from assisted living facility by her daughter. Patient was more confused and having generalized weakness for the past 3-4 days. In the ED she was found to be hypotensive, tachycardic and white count is elevated  #Acute encephalopathy secondary to sepsis-bacteremia Transferred to Step down unit overnight due to hypotension and high lactic acid. Met septic criteria with hypotension, tachycardia, leukocytosis and elevated lactic acid Patient is started on broad-spectrum IV antibiotics aztreonam, levofloxacin and vancomycin per protocol as patient is allergic to penicillin IV fluids Blood cultures, urine cultures are ordered- MRSA in bl cx- cont vanc only. Repeat bl cx after 2 doses of vanc is negative so far. Will continue wound care of the second 2toe  Appreciated ID and Cardiology help. Cardio suggest removal of AICD. Spoke to [facility] transfer center and pt is accepted for further care.   #Essential hypertension Currently patient's blood pressure is soft. Held antihypertensives on admission. Now BP stable and have some tachycardia up to 100, so start low dose metoprolol.  #Chronic non-Hodgkin's lymphoma continue follow-up with cancer Center for surveillance as recommended  #Paroxysmal atrial fibrillation Patient was  tachycardic but with IV fluids heart rate is improving Continue home medication digoxin andeliquis   # Ch systolic CHF- EF 01%   Stable, monitor.  #Chronic history of dementia continue donepezil      All the records are reviewed and case discussed with Care Management/Social Workerr. Management plans discussed with the patient, family and they are in agreement.  CODE STATUS: Full.  TOTAL TIME TAKING CARE OF  THIS PATIENT: 85 minutes.   Spoke to pt , her family, cardiologist and [facility] transfer center. Arranged for transfer and did the required documentation for that.  POSSIBLE D/C IN 2-3 DAYS, DEPENDING ON CLINICAL CONDITION.   Vaughan Basta M.D on 10/17/2017   Between 7am to 6pm - Pager - [phone]  After 6pm go to [email or web address] - password [email or web address] [facility]  Office  [phone]  CC: Primary care physician; Marinda Elk, MD  Note: This dictation was prepared with Dragon dictation along with smaller phrase technology. Any transcriptional errors that result from this process are unintentional.

## 2017-10-17 NOTE — Progress Notes (Signed)
[facility] at [place] NAME: Mindy Cortez    MR#:  [id number]  DATE OF BIRTH:  Jun 10, 1935  SUBJECTIVE:  CHIEF COMPLAINT:   Chief Complaint  Patient presents with  . Tachycardia     Sent back from rehab after recent d./c for sepsis, cellulitis. Now noted for bacteremia.  REVIEW OF SYSTEMS:  CONSTITUTIONAL: No fever, fatigue or weakness.  EYES: No blurred or double vision.  EARS, NOSE, AND THROAT: No tinnitus or ear pain.  RESPIRATORY: No cough, shortness of breath, wheezing or hemoptysis.  CARDIOVASCULAR: No chest pain, orthopnea, edema.  GASTROINTESTINAL: No nausea, vomiting, diarrhea or abdominal pain.  GENITOURINARY: No dysuria, hematuria.  ENDOCRINE: No polyuria, nocturia,  HEMATOLOGY: No anemia, easy bruising or bleeding SKIN: No rash or lesion. MUSCULOSKELETAL: No joint pain or arthritis.   NEUROLOGIC: No tingling, numbness, weakness.  PSYCHIATRY: No anxiety or depression.   ROS  DRUG ALLERGIES:   Allergies  Allergen Reactions  . Penicillin V Potassium Hives    Has patient had a PCN reaction causing immediate rash, facial/tongue/throat swelling, SOB or lightheadedness with hypotension: Unknown Has patient had a PCN reaction causing severe rash involving mucus membranes or skin necrosis: Unknown Has patient had a PCN reaction that required hospitalization: Unknown Has patient had a PCN reaction occurring within the last 10 years: Unknown If all of the above answers are "NO", then may proceed with Cephalosporin use.   Marland Kitchen Prochlorperazine Nausea And Vomiting    DIZZINESS, DRUNK FEELING. DIZZINESS, DRUNK FEELING.  . Ramipril Other (See Comments)    SEVERE HEADACHE SEVERE HEADACHE  . Rifampin     Severe stomach irriatates cramping and nausea  . Sulfa Antibiotics     TOLD AS A CHILD, MADE PT NERVOUS, EMOTIONAL, TOLD NOT TO TAKE.  . Cephalexin Rash    RASH, ITCHING. RASH, ITCHING.    VITALS:  Blood pressure 119/61, pulse 95,  temperature 98.3 F (36.8 C), resp. rate 17, height 5' 5" (1.651 m), weight 68.5 kg (151 lb 0.2 oz), SpO2 90 %.  PHYSICAL EXAMINATION:   GENERAL:  82 y.o.-year-old patient lying in the bed with no acute distress.  EYES: Pupils equal, round, reactive to light and accommodation. No scleral icterus. Extraocular muscles intact.  HEENT: Head atraumatic, normocephalic. Oropharynx and nasopharynx clear.  NECK:  Supple, no jugular venous distention. No thyroid enlargement, no tenderness.  LUNGS: Normal breath sounds bilaterally, no wheezing, rales,rhonchi or crepitation. No use of accessory muscles of respiration.  CARDIOVASCULAR: S1, S2 normal. No murmurs, rubs, or gallops.  ABDOMEN: Soft, nontender, nondistended. Bowel sounds present. No organomegaly or mass.  EXTREMITIES: Right foot second toe ulcer is healing. No pedal edema, cyanosis, or clubbing.  NEUROLOGIC: Patient is awake and alert but oriented 1-2. Sensation intact. Gait not checked.  PSYCHIATRIC: The patient is alert and oriented x1-2  SKIN: No obvious rash, lesion   Physical Exam LABORATORY PANEL:   CBC Recent Labs  Lab 10/17/17 0325  WBC 26.1*  HGB 9.8*  HCT 30.2*  PLT 201   ------------------------------------------------------------------------------------------------------------------  Chemistries  Recent Labs  Lab 10/12/17 0340  10/17/17 0325  NA 136   < > 136  K 3.1*   < > 4.0  CL 106   < > 111  CO2 23   < > 19*  GLUCOSE 104*   < > 121*  BUN 12   < > 22*  CREATININE 0.71   < > 0.67  CALCIUM 8.2*   < >  7.6*  MG 1.6*  --   --   AST  --    < > 33  ALT  --    < > 16  ALKPHOS  --    < > 113  BILITOT  --    < > 0.8   < > = values in this interval not displayed.   ------------------------------------------------------------------------------------------------------------------  Cardiac Enzymes Recent Labs  Lab 10/10/17 1419  TROPONINI <0.03    ------------------------------------------------------------------------------------------------------------------  RADIOLOGY:  Dg Chest Port 1 View  Result Date: 10/16/2017 CLINICAL DATA:  Respiratory failure. EXAM: PORTABLE CHEST 1 VIEW COMPARISON:  10/15/2017 and 10/10/2017 FINDINGS: The patient has developed slight pulmonary vascular congestion and slight interstitial accentuation since the prior study. Overall heart size is normal. AICD in place. No effusions. No acute bone abnormality. IMPRESSION: New slight pulmonary vascular congestion. Interstitial accentuation probably represents mild pulmonary edema. Electronically Signed   By: Lorriane Shire M.D.   On: 10/16/2017 10:33   Dg Chest Portable 1 View  Result Date: 10/15/2017 CLINICAL DATA:  Tachycardia EXAM: PORTABLE CHEST 1 VIEW COMPARISON:  10/10/2017 FINDINGS: Left-sided ICD device. Mild cardiomegaly. No focal consolidation or effusion. No pneumothorax. IMPRESSION: Mild cardiomegaly.  Negative for edema or focal infiltrate. Electronically Signed   By: Donavan Foil M.D.   On: 10/15/2017 15:21   Dg Hip Unilat With Pelvis 1v Right  Result Date: 10/16/2017 CLINICAL DATA:  Right hip pain. EXAM: DG HIP (WITH OR WITHOUT PELVIS) 1V RIGHT COMPARISON:  Radiographs dated 10/10/2017 and CT scan dated 09/05/2017 FINDINGS: There has been no change since the prior exams. Again noted is previous resection of proximal femur with evidence of a removed hip prosthesis. There is marked deformity and sclerosis of the right acetabulum. Dystrophic calcifications and probable methylmethacrylate remnants are noted, unchanged. There is no new bone destruction or fracture. The pelvic bones otherwise appear normal. IMPRESSION: No acute abnormality. No change since the prior exams. 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 Electronically Signed   By: Lorriane Shire M.D.   On: 10/16/2017 07:09    ASSESSMENT AND PLAN:   Principal Problem:   MRSA  bacteremia Active Problems:   Sepsis ([place])   Pressure injury of skin   Septic shock ([place])   Right lower quadrant abdominal pain  Mindy Cortez  is a 82 y.o. female with a known history of  hypertension, neuropathy, non-Hodgkin's lymphoma status post bone marrow transplantation, chronic systolic congestive heart failure with ejection fraction 25% was recently admitted to the hospital with a diagnosis of sepsis and discharged on December 22 with a discharge diagnosis of right toe ulcer and patient was sent home with the clindamycin, patient is compliant with the medication is brought into the hospital for altered mental status from assisted living facility by her daughter. Patient was more confused and having generalized weakness for the past 3-4 days. In the ED she was found to be hypotensive, tachycardic and white count is elevated  #Acute encephalopathy secondary to sepsis- bacteremia Transferred to Step down unit overnight due to hypotension and high lactic acid. Met septic criteria with hypotension, tachycardia, leukocytosis and elevated lactic acid Patient is started on broad-spectrum IV antibiotics aztreonam, levofloxacin and vancomycin per protocol as patient is allergic to penicillin IV fluids Blood cultures, urine cultures are ordered- MRSA in bl cx- cont vanc only. Will continue wound care of the second 2toe   #Essential hypertension Currently patient's blood pressure is soft. Hold antihypertensives.  #Chronic non-Hodgkin's lymphoma continue follow-up with cancer Center  for surveillance as recommended  #Paroxysmal atrial fibrillation Patient was tachycardic but with IV fluids heart rate is improving Continue home medication digoxin and eliquis   #Chronic history of dementia continue donepezil      All the records are reviewed and case discussed with Care Management/Social Workerr. Management plans discussed with the patient, family and they are in agreement.  CODE  STATUS: Full.  TOTAL TIME TAKING CARE OF THIS PATIENT: 35 minutes.     POSSIBLE D/C IN 2-3 DAYS, DEPENDING ON CLINICAL CONDITION.   Vaughan Basta M.D on 10/17/2017   Between 7am to 6pm - Pager - [phone]  After 6pm go to [email or web address] - password [email or web address] [facility]  Office  [phone]  CC: Primary care physician; Marinda Elk, MD  Note: This dictation was prepared with Dragon dictation along with smaller phrase technology. Any transcriptional errors that result from this process are unintentional.

## 2017-10-17 NOTE — Care Management (Signed)
Patient to transfer to [facility] for removal of AICD

## 2017-10-17 NOTE — Progress Notes (Signed)
PHARMACY - PHYSICIAN COMMUNICATION CRITICAL VALUE ALERT - BLOOD CULTURE IDENTIFICATION (BCID)  Mindy Cortez is an 82 y.o. female who presented to [facility] on 10/15/2017 with a chief complaint of CHF exacerbation  Assessment:  + Bcx ( 1 bottle of 2)from 12/26= +GPC.   Note: BCx from 12/25=  4 of 4 bottles w/ +GPC; Staph aureus, Mec A +, sensitivities pending.  Name of physician (or Provider) ContactedAnselm Jungling  Current antibiotics: Vancomycin  Changes to prescribed antibiotics recommended:  Patient is on recommended antibiotics - No changes needed  Results for orders placed or performed during the hospital encounter of 10/15/17  Blood Culture ID Panel (Reflexed) (Collected: 10/15/2017  2:39 PM)  Result Value Ref Range   Enterococcus species NOT DETECTED NOT DETECTED   Listeria monocytogenes NOT DETECTED NOT DETECTED   Staphylococcus species DETECTED (A) NOT DETECTED   Staphylococcus aureus DETECTED (A) NOT DETECTED   Methicillin resistance DETECTED (A) NOT DETECTED   Streptococcus species NOT DETECTED NOT DETECTED   Streptococcus agalactiae NOT DETECTED NOT DETECTED   Streptococcus pneumoniae NOT DETECTED NOT DETECTED   Streptococcus pyogenes NOT DETECTED NOT DETECTED   Acinetobacter baumannii NOT DETECTED NOT DETECTED   Enterobacteriaceae species NOT DETECTED NOT DETECTED   Enterobacter cloacae complex NOT DETECTED NOT DETECTED   Escherichia coli NOT DETECTED NOT DETECTED   Klebsiella oxytoca NOT DETECTED NOT DETECTED   Klebsiella pneumoniae NOT DETECTED NOT DETECTED   Proteus species NOT DETECTED NOT DETECTED   Serratia marcescens NOT DETECTED NOT DETECTED   Haemophilus influenzae NOT DETECTED NOT DETECTED   Neisseria meningitidis NOT DETECTED NOT DETECTED   Pseudomonas aeruginosa NOT DETECTED NOT DETECTED   Candida albicans NOT DETECTED NOT DETECTED   Candida glabrata NOT DETECTED NOT DETECTED   Candida krusei NOT DETECTED NOT DETECTED   Candida parapsilosis NOT DETECTED  NOT DETECTED   Candida tropicalis NOT DETECTED NOT DETECTED    Willer Osorno A 10/17/2017  12:18 PM

## 2017-10-17 NOTE — Progress Notes (Signed)
MD paged to make aware of multiple runs of v.tach on tele/ 8 beats the longest run/ pt asymptomatic/ will continue to monitor.

## 2017-10-18 ENCOUNTER — Ambulatory Visit (INDEPENDENT_AMBULATORY_CARE_PROVIDER_SITE_OTHER): Payer: Medicare Other | Admitting: Vascular Surgery

## 2017-10-18 LAB — CULTURE, BLOOD (ROUTINE X 2): SPECIAL REQUESTS: ADEQUATE

## 2017-10-19 LAB — CULTURE, BLOOD (ROUTINE X 2)

## 2017-10-21 LAB — CULTURE, BLOOD (ROUTINE X 2)
CULTURE: NO GROWTH
SPECIAL REQUESTS: ADEQUATE

## 2017-12-14 ENCOUNTER — Inpatient Hospital Stay
Admission: EM | Admit: 2017-12-14 | Discharge: 2017-12-16 | DRG: 292 | Disposition: A | Discharge status: Home Health | Payer: Medicare Other | Attending: Internal Medicine | Admitting: Internal Medicine

## 2017-12-14 ENCOUNTER — Emergency Department: Payer: Medicare Other

## 2017-12-14 ENCOUNTER — Other Ambulatory Visit: Payer: Self-pay

## 2017-12-14 ENCOUNTER — Encounter: Payer: Self-pay | Admitting: Emergency Medicine

## 2017-12-14 DIAGNOSIS — Z923 Personal history of irradiation: Secondary | ICD-10-CM | POA: Diagnosis not present

## 2017-12-14 DIAGNOSIS — G309 Alzheimer's disease, unspecified: Secondary | ICD-10-CM | POA: Diagnosis present

## 2017-12-14 DIAGNOSIS — Z880 Allergy status to penicillin: Secondary | ICD-10-CM

## 2017-12-14 DIAGNOSIS — I427 Cardiomyopathy due to drug and external agent: Secondary | ICD-10-CM | POA: Diagnosis present

## 2017-12-14 DIAGNOSIS — F0150 Vascular dementia without behavioral disturbance: Secondary | ICD-10-CM | POA: Diagnosis present

## 2017-12-14 DIAGNOSIS — G4709 Other insomnia: Secondary | ICD-10-CM | POA: Diagnosis present

## 2017-12-14 DIAGNOSIS — H409 Unspecified glaucoma: Secondary | ICD-10-CM | POA: Diagnosis present

## 2017-12-14 DIAGNOSIS — I5023 Acute on chronic systolic (congestive) heart failure: Secondary | ICD-10-CM | POA: Diagnosis present

## 2017-12-14 DIAGNOSIS — Z79899 Other long term (current) drug therapy: Secondary | ICD-10-CM

## 2017-12-14 DIAGNOSIS — Z89629 Acquired absence of unspecified hip joint: Secondary | ICD-10-CM | POA: Diagnosis not present

## 2017-12-14 DIAGNOSIS — Z66 Do not resuscitate: Secondary | ICD-10-CM | POA: Diagnosis present

## 2017-12-14 DIAGNOSIS — Z881 Allergy status to other antibiotic agents status: Secondary | ICD-10-CM

## 2017-12-14 DIAGNOSIS — G629 Polyneuropathy, unspecified: Secondary | ICD-10-CM | POA: Diagnosis present

## 2017-12-14 DIAGNOSIS — I509 Heart failure, unspecified: Secondary | ICD-10-CM

## 2017-12-14 DIAGNOSIS — Z888 Allergy status to other drugs, medicaments and biological substances status: Secondary | ICD-10-CM

## 2017-12-14 DIAGNOSIS — Z7901 Long term (current) use of anticoagulants: Secondary | ICD-10-CM | POA: Diagnosis not present

## 2017-12-14 DIAGNOSIS — N3941 Urge incontinence: Secondary | ICD-10-CM | POA: Diagnosis present

## 2017-12-14 DIAGNOSIS — F0280 Dementia in other diseases classified elsewhere without behavioral disturbance: Secondary | ICD-10-CM | POA: Diagnosis present

## 2017-12-14 DIAGNOSIS — Z8614 Personal history of Methicillin resistant Staphylococcus aureus infection: Secondary | ICD-10-CM

## 2017-12-14 DIAGNOSIS — R0902 Hypoxemia: Secondary | ICD-10-CM | POA: Diagnosis present

## 2017-12-14 DIAGNOSIS — Z9221 Personal history of antineoplastic chemotherapy: Secondary | ICD-10-CM

## 2017-12-14 DIAGNOSIS — Z8572 Personal history of non-Hodgkin lymphomas: Secondary | ICD-10-CM

## 2017-12-14 DIAGNOSIS — Z9481 Bone marrow transplant status: Secondary | ICD-10-CM

## 2017-12-14 DIAGNOSIS — T451X5A Adverse effect of antineoplastic and immunosuppressive drugs, initial encounter: Secondary | ICD-10-CM | POA: Diagnosis present

## 2017-12-14 DIAGNOSIS — Z7982 Long term (current) use of aspirin: Secondary | ICD-10-CM | POA: Diagnosis not present

## 2017-12-14 DIAGNOSIS — I480 Paroxysmal atrial fibrillation: Secondary | ICD-10-CM | POA: Diagnosis present

## 2017-12-14 DIAGNOSIS — I110 Hypertensive heart disease with heart failure: Secondary | ICD-10-CM | POA: Diagnosis present

## 2017-12-14 DIAGNOSIS — F329 Major depressive disorder, single episode, unspecified: Secondary | ICD-10-CM | POA: Diagnosis present

## 2017-12-14 DIAGNOSIS — E876 Hypokalemia: Secondary | ICD-10-CM | POA: Diagnosis present

## 2017-12-14 DIAGNOSIS — Z8249 Family history of ischemic heart disease and other diseases of the circulatory system: Secondary | ICD-10-CM

## 2017-12-14 DIAGNOSIS — I10 Essential (primary) hypertension: Secondary | ICD-10-CM | POA: Diagnosis present

## 2017-12-14 DIAGNOSIS — F32A Depression, unspecified: Secondary | ICD-10-CM | POA: Diagnosis present

## 2017-12-14 LAB — TROPONIN I

## 2017-12-14 LAB — CBC
HCT: 29.6 % — ABNORMAL LOW (ref 35.0–47.0)
HEMOGLOBIN: 9.3 g/dL — AB (ref 12.0–16.0)
MCH: 25.9 pg — ABNORMAL LOW (ref 26.0–34.0)
MCHC: 31.5 g/dL — ABNORMAL LOW (ref 32.0–36.0)
MCV: 82.4 fL (ref 80.0–100.0)
Platelets: 372 10*3/uL (ref 150–440)
RBC: 3.59 MIL/uL — AB (ref 3.80–5.20)
RDW: 19.3 % — ABNORMAL HIGH (ref 11.5–14.5)
WBC: 7.7 10*3/uL (ref 3.6–11.0)

## 2017-12-14 LAB — BASIC METABOLIC PANEL
ANION GAP: 13 (ref 5–15)
BUN: 12 mg/dL (ref 6–20)
CALCIUM: 8.4 mg/dL — AB (ref 8.9–10.3)
CO2: 20 mmol/L — ABNORMAL LOW (ref 22–32)
Chloride: 104 mmol/L (ref 101–111)
Creatinine, Ser: 0.99 mg/dL (ref 0.44–1.00)
GFR, EST NON AFRICAN AMERICAN: 52 mL/min — AB (ref >60)
Glucose, Bld: 110 mg/dL — ABNORMAL HIGH (ref 65–99)
Potassium: 3.1 mmol/L — ABNORMAL LOW (ref 3.5–5.1)
SODIUM: 137 mmol/L (ref 135–145)

## 2017-12-14 LAB — INFLUENZA PANEL BY PCR (TYPE A & B)
INFLAPCR: NEGATIVE
Influenza B By PCR: NEGATIVE

## 2017-12-14 MED ORDER — ALBUTEROL SULFATE (2.5 MG/3ML) 0.083% IN NEBU
5.0000 mg | INHALATION_SOLUTION | Freq: Once | RESPIRATORY_TRACT | Status: AC
  Administered 2017-12-14: 5 mg via RESPIRATORY_TRACT
  Filled 2017-12-14: qty 6

## 2017-12-14 MED ORDER — FUROSEMIDE 10 MG/ML IJ SOLN
40.0000 mg | Freq: Once | INTRAMUSCULAR | Status: AC
  Administered 2017-12-14: 40 mg via INTRAVENOUS
  Filled 2017-12-14: qty 4

## 2017-12-14 NOTE — ED Notes (Signed)
Unsuccessful IV attempts x 2 by this nurse, was able to get blood sample during one attempt

## 2017-12-14 NOTE — ED Triage Notes (Signed)
Pt arrives POV to triage with SOB x1 week. Pt is a CHF pt with hx of sepsis. Pt has good apical pulse but faint at this time. Pt denies pain at this time and has clear lung sounds. Pt is in NAD.

## 2017-12-14 NOTE — ED Notes (Signed)
Pt's spO2 noted to be 88% on RA with good waveform, pt placed on 2L oxygen via [place]

## 2017-12-14 NOTE — ED Provider Notes (Signed)
[facility] Emergency Department Provider Note ____________________________________________   First MD Initiated Contact with Patient 12/14/17 2149     (approximate)  I have reviewed the triage vital signs and the nursing notes.   HISTORY  Chief Complaint Shortness of Breath    HPI Mindy Cortez is a 83 y.o. female with past medical history of CHF and other PMH as noted below who presents with shortness of breath over approximately the last week, gradual onset, persistent course, associated with lower extremity swelling.  The symptoms are worse when the patient is lying down.  She denies associated fever or chest pain, but does report some cough.  She was recently admitted last month at [facility] for sepsis with a prolonged hospital course, but has been recovering well in the last few weeks.  Per the daughter-in-law, 2 of her blood pressure/cardiac medications including digoxin were discontinued after her admission and had not yet been restarted.  Past Medical History:  Diagnosis Date  . AICD (automatic cardioverter/defibrillator) present    non-functioning due to no active battery in place  . Atrial fibrillation ([place])   . CHF (congestive heart failure) ([place])   . Depression   . 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 HTN (hypertension)   . Hypertension   . Neuropathy   . Nocturia   . Non-Hodgkin lymphoma ([place])    Bone Marrow Transplant with chemo + Rad tx's.  . Tachycardia   . Urge incontinence   . Urinary incontinence     Patient Active Problem List   Diagnosis Date Noted  . MRSA bacteremia 10/16/2017  . Septic shock ([place])   . Right lower quadrant abdominal pain   . Pressure injury of skin 10/15/2017  . Sepsis ([place]) 10/10/2017  . Atherosclerosis of native arteries of the extremities with ulceration ([place]) 12/11/2016  . Acquired absence of right hip 04/08/2015  . Bilateral cataracts 04/08/2015  . CCF  (congestive cardiac failure) ([place]) 04/08/2015  . Clinical depression 04/08/2015  . Glaucoma 04/08/2015  . BP (high blood pressure) 04/08/2015  . Neuropathy 04/08/2015  . Insomnia, persistent 03/16/2015  . Mixed Alzheimer's and vascular dementia 03/16/2015  . Atrial fibrillation with rapid ventricular response ([place]) 02/08/2015  . B12 deficiency 08/02/2014  . Leg pain, right 06/09/2014  . Leg numbness 06/09/2014  . Endomyocardial disease ([place]) 11/13/2006  . Cardiac failure left ([place]) 11/13/2006  . Lymphoma, non-Hodgkin's ([place]) 11/13/2006  . Infection and inflammatory reaction due to internal prosthetic device, implant, and graft 11/13/2006  . Infection of breast implant ([place]) 11/13/2006  . Lymphoma of extranodal and solid organ sites ([place]) 11/13/2006  . Cardiomyopathy ([place]) 11/13/2006    Past Surgical History:  Procedure Laterality Date  . BONE MARROW TRANSPLANT    . COMPLICATED TOTAL HIP PROSTHESIS AND METHYLMETHACRALATE REMOVAL W/O SPACER INSERTION    . PERIPHERAL VASCULAR CATHETERIZATION Right 06/18/2016   Procedure: Lower Extremity Angiography;  Surgeon: Algernon Huxley, MD;  Location: [place] [facility];  Service: Cardiovascular;  Laterality: Right;  . PERIPHERAL VASCULAR CATHETERIZATION  06/18/2016   Procedure: Lower Extremity Intervention;  Surgeon: Algernon Huxley, MD;  Location: [place] [facility];  Service: Cardiovascular;;  . TOTAL HIP ARTHROPLASTY  [id number]    Prior to Admission medications   Medication Sig Start Date End Date Taking? Authorizing Provider  acetaminophen (TYLENOL) 325 MG tablet Take 650 mg by mouth every 6 (six) hours as needed.   Yes [provider]  acidophilus (RISAQUAD) CAPS capsule Take 1 capsule by mouth daily.   Yes [provider]  apixaban (ELIQUIS) 2.5 MG TABS tablet Take 2.5 mg by mouth 2 (two) times daily.   Yes [provider]  aspirin 81 MG chewable tablet Chew 81 mg by mouth daily.   Yes [provider]    cholecalciferol (VITAMIN D) 1000 units tablet Take 1,000 Units by mouth daily.   Yes [provider]  donepezil (ARICEPT) 10 MG tablet Take 10 mg by mouth at bedtime.   Yes [provider]  DULoxetine (CYMBALTA) 30 MG capsule Take 3 capsules (90 mg total) by mouth daily. 10/18/17  Yes Vaughan Basta, MD  ferrous sulfate 325 (65 FE) MG tablet Take 325 mg by mouth daily with breakfast.   Yes [provider]  furosemide (LASIX) 20 MG tablet Take 20 mg by mouth daily as needed for edema.   Yes [provider]  magnesium oxide (MAG-OX) 400 MG tablet Take 400 mg by mouth daily.   Yes [provider]  Melatonin 3 MG TBDP Take 3 mg by mouth at bedtime.   Yes [provider]  metoprolol tartrate (LOPRESSOR) 25 MG tablet Take 0.5 tablets (12.5 mg total) by mouth 2 (two) times daily. 10/17/17  Yes Vaughan Basta, MD  Multiple Vitamin (MULTIVITAMIN WITH MINERALS) TABS tablet Take 1 tablet by mouth daily.   Yes [provider]  aspirin EC 81 MG tablet Take 1 tablet (81 mg total) by mouth daily. Patient not taking: Reported on 12/14/2017 06/18/16   Algernon Huxley, MD  atorvastatin (LIPITOR) 20 MG tablet Take 1 tablet (20 mg total) by mouth daily. Patient not taking: Reported on 10/10/2017 06/18/16   Algernon Huxley, MD  Vancomycin (VANCOCIN) 750-5 MG/150ML-% SOLN Inject 150 mLs (750 mg total) into the vein every 12 (twelve) hours. Patient not taking: Reported on 12/14/2017 10/18/17   Vaughan Basta, MD    Allergies Penicillin v potassium; Prochlorperazine; Ramipril; Rifampin; Sulfa antibiotics; and Cephalexin  Family History  Problem Relation Age of Onset  . Osteosarcoma Father   . Heart disease Father   . Heart disease Mother   . Breast cancer Mother     Social History Social History   Tobacco Use  . Smoking status: Never Smoker  . Smokeless tobacco: Never Used  Substance Use Topics  . Alcohol use: Yes     Alcohol/week: 0.0 oz    Comment: rarely  . Drug use: No    Review of Systems  Constitutional: No fever/chills. Eyes: No redness. ENT: No sore throat. Cardiovascular: Denies chest pain. Respiratory: Positive for shortness of breath. Gastrointestinal: No nausea, no vomiting.   Genitourinary: Negative for dysuria.  Musculoskeletal: Negative for back pain. Skin: Negative for rash. Neurological: Negative for headache.   ____________________________________________   PHYSICAL EXAM:  VITAL SIGNS: ED Triage Vitals  Enc Vitals Group     BP 12/14/17 1952 (!) 127/56     Pulse Rate 12/14/17 1952 80     Resp 12/14/17 1952 18     Temp 12/14/17 1952 97.6 F (36.4 C)     Temp Source 12/14/17 1952 Oral     SpO2 12/14/17 1951 99 %     Weight 12/14/17 1953 130 lb (59 kg)     Height 12/14/17 1953 5\' 5"  (1.651 m)     Head Circumference --      Peak Flow --      Pain Score 12/14/17 1953 0  Pain Loc --      Pain Edu? --      Excl. in [place]? --     Constitutional: Alert and oriented. W relatively comfortable appearing.  No acute distress. Eyes: Conjunctivae are normal.  Head: Atraumatic. Nose: No congestion/rhinnorhea. Mouth/Throat: Mucous membranes are moist.   Neck: Normal range of motion.  Cardiovascular: Normal rate, regular rhythm. Grossly normal heart sounds.  Good peripheral circulation. Respiratory: Normal respiratory effort.  No retractions.  Decreased breath sounds bilaterally with faint rales. Gastrointestinal: Soft and nontender. No distention.  Genitourinary: No flank tenderness. Musculoskeletal: Trace bilateral lower extremity edema.  Extremities warm and well perfused.  Neurologic:  Normal speech and language. No gross focal neurologic deficits are appreciated.  Skin:  Skin is warm and dry. No rash noted. Psychiatric: Mood and affect are normal. Speech and behavior are normal.  ____________________________________________   LABS (all labs ordered are listed, but  only abnormal results are displayed)  Labs Reviewed  BASIC METABOLIC PANEL - Abnormal; Notable for the following components:      Result Value   Potassium 3.1 (*)    CO2 20 (*)    Glucose, Bld 110 (*)    Calcium 8.4 (*)    GFR calc non Af Amer 52 (*)    All other components within normal limits  CBC - Abnormal; Notable for the following components:   RBC 3.59 (*)    Hemoglobin 9.3 (*)    HCT 29.6 (*)    MCH 25.9 (*)    MCHC 31.5 (*)    RDW 19.3 (*)    All other components within normal limits  TROPONIN I  INFLUENZA PANEL BY PCR (TYPE A & B)  BRAIN NATRIURETIC PEPTIDE   ____________________________________________  EKG  ED ECG REPORT I, Arta Silence, the attending physician, personally viewed and interpreted this ECG.  Date: 12/14/2017 EKG Time: 1954 Rate: 77 Rhythm: normal sinus rhythm QRS Axis: normal Intervals: normal ST/T Wave abnormalities: Nonspecific ST abnormalities anteriorly Narrative Interpretation: no evidence of acute ischemia; no significant change when compared to EKG dated 10/15/2017  ____________________________________________  RADIOLOGY  CXR: Bilateral interstitial opacities consistent with pulmonary edema  ____________________________________________   PROCEDURES  Procedure(s) performed: No  Procedures  Critical Care performed: No ____________________________________________   INITIAL IMPRESSION / ASSESSMENT AND PLAN / ED COURSE  Pertinent labs & imaging results that were available during my care of the patient were reviewed by me and considered in my medical decision making (see chart for details).  83 year old female with past medical history as noted above presents with gradual onset worsening shortness of breath over approximately last week, associated with some cough.  Patient also has some peripheral edema.  Past medical records reviewed in Epic and care everywhere; the patient was most recently admitted in late December  for sepsis and was transferred to [facility] for removal of AICD.  On exam, the patient is in no acute distress.  She was hypoxic initially on room air (patient is not normally on home oxygen) and there were some rales bilaterally.  Overall presentation is most consistent with CHF.  Also consider influenza, acute bronchitis, less likely pneumonia.  Plan: Chest x-ray, cardiac workup, basic labs, and reassess.  ----------------------------------------- 10:28 PM on 12/14/2017 -----------------------------------------  Chest x-ray and labs most consistent with CHF, which fits with patient's history, as well as with the fact that she was taken off of her digoxin and another blood pressure medication during her prolonged hospitalization.  I am waiting influenza swab  and BNP, and plan for admission due to patient's hypoxia.  Lasix administered.   ----------------------------------------- 11:13 PM on 12/14/2017 -----------------------------------------  I signed the patient out to the hospitalist Dr. Jannifer Franklin.  ____________________________________________   FINAL CLINICAL IMPRESSION(S) / ED DIAGNOSES  Final diagnoses:  Acute congestive heart failure, unspecified heart failure type ([place])      NEW MEDICATIONS STARTED DURING THIS VISIT:  New Prescriptions   No medications on file     Note:  This document was prepared using Dragon voice recognition software and may include unintentional dictation errors.    Arta Silence, MD 12/14/17 [phone]

## 2017-12-14 NOTE — H&P (Signed)
[facility] [place] NAME: Mindy Cortez    MR#:  [id number]  DATE OF BIRTH:  April 07, 1935  DATE OF ADMISSION:  12/14/2017  PRIMARY CARE PHYSICIAN: Marinda Elk, MD   REQUESTING/REFERRING PHYSICIAN: Cherylann Banas, MD  CHIEF COMPLAINT:   Chief Complaint  Patient presents with  . Shortness of Breath    HISTORY OF PRESENT ILLNESS:  Mindy Cortez  is a 83 y.o. female who presents with some shortness of breath.  Patient had long hospital stay a little more than a month ago where she was treated for sepsis and had her AICD removed.  During that hospitalization she had some of her blood pressure medications and heart failure medications changed.  The last several days she has had significant increase in shortness of breath.  On workup here tonight in the ED chest x-ray shows pulmonary edema.  Hospitalist were called for admission.  PAST MEDICAL HISTORY:   Past Medical History:  Diagnosis Date  . AICD (automatic cardioverter/defibrillator) present    non-functioning due to no active battery in place  . Atrial fibrillation ([place])   . CHF (congestive heart failure) ([place])   . Depression   . 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 HTN (hypertension)   . Hypertension   . Neuropathy   . Nocturia   . Non-Hodgkin lymphoma ([place])    Bone Marrow Transplant with chemo + Rad tx's.  . Tachycardia   . Urge incontinence   . Urinary incontinence     PAST SURGICAL HISTORY:   Past Surgical History:  Procedure Laterality Date  . BONE MARROW TRANSPLANT    . COMPLICATED TOTAL HIP PROSTHESIS AND METHYLMETHACRALATE REMOVAL W/O SPACER INSERTION    . PERIPHERAL VASCULAR CATHETERIZATION Right 06/18/2016   Procedure: Lower Extremity Angiography;  Surgeon: Algernon Huxley, MD;  Location: [place] [facility];  Service: Cardiovascular;  Laterality: Right;  . PERIPHERAL VASCULAR CATHETERIZATION  06/18/2016   Procedure: Lower Extremity Intervention;  Surgeon: Algernon Huxley, MD;  Location: [place] [facility];  Service: Cardiovascular;;  . TOTAL HIP ARTHROPLASTY  [id number]    SOCIAL HISTORY:   Social History   Tobacco Use  . Smoking status: Never Smoker  . Smokeless tobacco: Never Used  Substance Use Topics  . Alcohol use: Yes    Alcohol/week: 0.0 oz    Comment: rarely    FAMILY HISTORY:   Family History  Problem Relation Age of Onset  . Osteosarcoma Father   . Heart disease Father   . Heart disease Mother   . Breast cancer Mother     DRUG ALLERGIES:   Allergies  Allergen Reactions  . Penicillin V Potassium Hives    Has patient had a PCN reaction causing immediate rash, facial/tongue/throat swelling, SOB or lightheadedness with hypotension: Unknown Has patient had a PCN reaction causing severe rash involving mucus membranes or skin necrosis: Unknown Has patient had a PCN reaction that required hospitalization: Unknown Has patient had a PCN reaction occurring within the last 10 years: Unknown If all of the above answers are "NO", then may proceed with Cephalosporin use.   Marland Kitchen Prochlorperazine Nausea And Vomiting    DIZZINESS, DRUNK FEELING. DIZZINESS, DRUNK FEELING.  . Ramipril Other (See Comments)    SEVERE HEADACHE SEVERE HEADACHE  . Rifampin     Severe stomach irriatates cramping and nausea  . Sulfa Antibiotics     TOLD AS A CHILD,  MADE PT NERVOUS, EMOTIONAL, TOLD NOT TO TAKE.  . Cephalexin Rash    RASH, ITCHING. RASH, ITCHING.    MEDICATIONS AT HOME:   Prior to Admission medications   Medication Sig Start Date End Date Taking? Authorizing Provider  acetaminophen (TYLENOL) 325 MG tablet Take 650 mg by mouth every 6 (six) hours as needed.   Yes [provider]  acidophilus (RISAQUAD) CAPS capsule Take 1 capsule by mouth daily.   Yes [provider]  apixaban (ELIQUIS) 2.5 MG TABS tablet Take 2.5 mg by mouth 2 (two) times daily.   Yes [provider]  aspirin 81 MG chewable tablet Chew 81 mg by mouth daily.   Yes [provider]  cholecalciferol (VITAMIN D) 1000 units tablet Take 1,000 Units by mouth daily.   Yes [provider]  donepezil (ARICEPT) 10 MG tablet Take 10 mg by mouth at bedtime.   Yes [provider]  DULoxetine (CYMBALTA) 30 MG capsule Take 3 capsules (90 mg total) by mouth daily. 10/18/17  Yes Vaughan Basta, MD  ferrous sulfate 325 (65 FE) MG tablet Take 325 mg by mouth daily with breakfast.   Yes [provider]  furosemide (LASIX) 20 MG tablet Take 20 mg by mouth daily as needed for edema.   Yes [provider]  magnesium oxide (MAG-OX) 400 MG tablet Take 400 mg by mouth daily.   Yes [provider]  Melatonin 3 MG TBDP Take 3 mg by mouth at bedtime.   Yes [provider]  metoprolol tartrate (LOPRESSOR) 25 MG tablet Take 0.5 tablets (12.5 mg total) by mouth 2 (two) times daily. 10/17/17  Yes Vaughan Basta, MD  Multiple Vitamin (MULTIVITAMIN WITH MINERALS) TABS tablet Take 1 tablet by mouth daily.   Yes [provider]  atorvastatin (LIPITOR) 20 MG tablet Take 1 tablet (20 mg total) by mouth daily. Patient not taking: Reported on 10/10/2017 06/18/16   Algernon Huxley, MD  Vancomycin (VANCOCIN) 750-5 MG/150ML-% SOLN Inject 150 mLs (750 mg total) into the vein every 12 (twelve) hours. Patient not taking: Reported on 12/14/2017 10/18/17   Vaughan Basta, MD    REVIEW OF SYSTEMS:  Review of Systems  Constitutional: Negative for chills, fever, malaise/fatigue and weight loss.  HENT: Negative for ear pain, hearing loss and tinnitus.   Eyes: Negative for blurred vision, double vision, pain and redness.  Respiratory: Positive for shortness of breath. Negative for cough and hemoptysis.   Cardiovascular: Positive for orthopnea. Negative for chest pain, palpitations and leg swelling.  Gastrointestinal: Negative for abdominal  pain, constipation, diarrhea, nausea and vomiting.  Genitourinary: Negative for dysuria, frequency and hematuria.  Musculoskeletal: Negative for back pain, joint pain and neck pain.  Skin:       No acne, rash, or lesions  Neurological: Negative for dizziness, tremors, focal weakness and weakness.  Endo/Heme/Allergies: Negative for polydipsia. Does not bruise/bleed easily.  Psychiatric/Behavioral: Negative for depression. The patient is not nervous/anxious and does not have insomnia.      VITAL SIGNS:   Vitals:   12/14/17 2130 12/14/17 2142 12/14/17 2200 12/14/17 2300  BP: (!) 143/81  136/78 135/83  Pulse: 77  78 81  Resp: (!) 25  (!) 25 (!) 31  Temp:      TempSrc:      SpO2: 91% (!) 88% 99% 100%  Weight:      Height:       Wt Readings from Last 3 Encounters:  12/14/17 59 kg (130 lb)  10/16/17 68.5 kg (151 lb 0.2 oz)  10/10/17 64.8 kg (142 lb 13.7 oz)    PHYSICAL EXAMINATION:  Physical Exam  Vitals reviewed. Constitutional: She is oriented to person, place, and time. She appears well-developed and well-nourished. No distress.  HENT:  Head: Normocephalic and atraumatic.  Mouth/Throat: Oropharynx is clear and moist.  Eyes: Conjunctivae and EOM are normal. Pupils are equal, round, and reactive to light. No scleral icterus.  Neck: Normal range of motion. Neck supple. No JVD present. No thyromegaly present.  Cardiovascular: Normal rate, regular rhythm and intact distal pulses. Exam reveals no gallop and no friction rub.  No murmur heard. Respiratory: Effort normal. No respiratory distress. She has no wheezes. She has rales.  GI: Soft. Bowel sounds are normal. She exhibits no distension. There is no tenderness.  Musculoskeletal: Normal range of motion. She exhibits no edema.  No arthritis, no gout  Lymphadenopathy:    She has no cervical adenopathy.  Neurological: She is alert and oriented to person, place, and time. No cranial nerve deficit.  No dysarthria, no aphasia  Skin:  Skin is warm and dry. No rash noted. No erythema.  Psychiatric: She has a normal mood and affect. Her behavior is normal. Judgment and thought content normal.    LABORATORY PANEL:   CBC Recent Labs  Lab 12/14/17 2108  WBC 7.7  HGB 9.3*  HCT 29.6*  PLT 372   ------------------------------------------------------------------------------------------------------------------  Chemistries  Recent Labs  Lab 12/14/17 2108  NA 137  K 3.1*  CL 104  CO2 20*  GLUCOSE 110*  BUN 12  CREATININE 0.99  CALCIUM 8.4*   ------------------------------------------------------------------------------------------------------------------  Cardiac Enzymes Recent Labs  Lab 12/14/17 2108  TROPONINI <0.03   ------------------------------------------------------------------------------------------------------------------  RADIOLOGY:  Dg Chest 2 View  Result Date: 12/14/2017 CLINICAL DATA:  Acute shortness of breath. EXAM: CHEST  2 VIEW COMPARISON:  10/16/2017 and prior radiograph FINDINGS: Cardiomegaly noted. Mild bilateral interstitial opacities are identified suggestive of edema. Bibasilar atelectasis noted. There is no evidence of pneumothorax. There may be trace bilateral pleural effusions present. No acute bony abnormality identified. IMPRESSION: Cardiomegaly with mild bilateral interstitial opacities which may represent mild interstitial edema. Bibasilar atelectasis. Question trace bilateral pleural effusions. Electronically Signed   By: Margarette Canada M.D.   On: 12/14/2017 20:45    EKG:   Orders placed or performed during the hospital encounter of 12/14/17  . EKG 12-Lead  . EKG 12-Lead  . ED EKG  . ED EKG    IMPRESSION AND PLAN:  Principal Problem:   Acute on chronic systolic CHF (congestive heart failure) (HCC) -IV diuresis given in the ED with some improvement in symptoms.  We will admit her to telemetry, continue with diuresis, and get a cardiology consult Active Problems:   HTN  (hypertension) -continue home meds   AF (paroxysmal atrial fibrillation) ([place]) -continue home rate controlling medications and anticoagulation   Clinical depression -continue home meds  All the records are reviewed and case discussed with ED provider. Management plans discussed with the patient and/or family.  DVT PROPHYLAXIS: Systemic anticoagulation  GI PROPHYLAXIS: None  ADMISSION STATUS: Inpatient  CODE STATUS: Full Code Status History    Date Active Date Inactive Code Status Order ID Comments User Context   10/15/2017 17:44 10/17/2017 20:34 Full Code [id number]  Nicholes Mango, MD ED   10/10/2017 20:11 10/12/2017 18:34 Full Code [id number]  Demetrios Loll, MD Inpatient   06/18/2016 12:53 06/18/2016 18:33 Full Code [id number]  Algernon Huxley, MD Inpatient  Advance Directive Documentation     Most Recent Value  Type of Advance Directive  Healthcare Power of Attorney  Pre-existing out of facility DNR order (yellow form or pink MOST form)  No data  "MOST" Form in Place?  No data      TOTAL TIME TAKING CARE OF THIS PATIENT: 45 minutes.   Jannifer Franklin, Tiani Stanbery Gifford 12/14/2017, 11:44 PM  [facility]  Office  [phone]  CC: Primary care physician; Marinda Elk, MD  Note:  This document was prepared using Dragon voice recognition software and may include unintentional dictation errors.

## 2017-12-15 LAB — MAGNESIUM: Magnesium: 1.7 mg/dL (ref 1.7–2.4)

## 2017-12-15 LAB — BASIC METABOLIC PANEL
Anion gap: 12 (ref 5–15)
BUN: 11 mg/dL (ref 6–20)
CALCIUM: 8.5 mg/dL — AB (ref 8.9–10.3)
CO2: 28 mmol/L (ref 22–32)
CREATININE: 0.99 mg/dL (ref 0.44–1.00)
Chloride: 101 mmol/L (ref 101–111)
GFR, EST NON AFRICAN AMERICAN: 52 mL/min — AB (ref >60)
GLUCOSE: 102 mg/dL — AB (ref 65–99)
Potassium: 2.6 mmol/L — CL (ref 3.5–5.1)
Sodium: 141 mmol/L (ref 135–145)

## 2017-12-15 LAB — CBC
HCT: 29.1 % — ABNORMAL LOW (ref 35.0–47.0)
HEMOGLOBIN: 9.4 g/dL — AB (ref 12.0–16.0)
MCH: 26.4 pg (ref 26.0–34.0)
MCHC: 32.3 g/dL (ref 32.0–36.0)
MCV: 81.8 fL (ref 80.0–100.0)
PLATELETS: 368 10*3/uL (ref 150–440)
RBC: 3.55 MIL/uL — AB (ref 3.80–5.20)
RDW: 19 % — ABNORMAL HIGH (ref 11.5–14.5)
WBC: 9 10*3/uL (ref 3.6–11.0)

## 2017-12-15 LAB — MRSA PCR SCREENING: MRSA BY PCR: NEGATIVE

## 2017-12-15 LAB — BRAIN NATRIURETIC PEPTIDE: B Natriuretic Peptide: 1489 pg/mL — ABNORMAL HIGH (ref 0.0–100.0)

## 2017-12-15 MED ORDER — METOPROLOL TARTRATE 25 MG PO TABS
12.5000 mg | ORAL_TABLET | Freq: Two times a day (BID) | ORAL | Status: DC
  Administered 2017-12-15 – 2017-12-16 (×4): 12.5 mg via ORAL
  Filled 2017-12-15 (×4): qty 1

## 2017-12-15 MED ORDER — CYCLOBENZAPRINE HCL 10 MG PO TABS
5.0000 mg | ORAL_TABLET | Freq: Two times a day (BID) | ORAL | Status: DC | PRN
  Administered 2017-12-15 (×2): 5 mg via ORAL
  Filled 2017-12-15 (×2): qty 1

## 2017-12-15 MED ORDER — DIGOXIN 125 MCG PO TABS
0.1250 mg | ORAL_TABLET | Freq: Every day | ORAL | Status: DC
  Administered 2017-12-15: 0.125 mg via ORAL
  Filled 2017-12-15 (×2): qty 1

## 2017-12-15 MED ORDER — MELATONIN 3 MG PO TBDP: 3.0000 mg | ORAL_TABLET | Freq: Every day | ORAL | Status: DC

## 2017-12-15 MED ORDER — DONEPEZIL HCL 5 MG PO TABS
10.0000 mg | ORAL_TABLET | Freq: Every day | ORAL | Status: DC
  Administered 2017-12-15 (×2): 10 mg via ORAL
  Filled 2017-12-15 (×3): qty 2

## 2017-12-15 MED ORDER — APIXABAN 2.5 MG PO TABS
2.5000 mg | ORAL_TABLET | Freq: Two times a day (BID) | ORAL | Status: DC
  Administered 2017-12-15 – 2017-12-16 (×4): 2.5 mg via ORAL
  Filled 2017-12-15 (×4): qty 1

## 2017-12-15 MED ORDER — FUROSEMIDE 10 MG/ML IJ SOLN
60.0000 mg | Freq: Two times a day (BID) | INTRAMUSCULAR | Status: DC
  Administered 2017-12-15 (×2): 60 mg via INTRAVENOUS
  Filled 2017-12-15 (×2): qty 6

## 2017-12-15 MED ORDER — DULOXETINE HCL 30 MG PO CPEP
90.0000 mg | ORAL_CAPSULE | Freq: Every day | ORAL | Status: DC
  Administered 2017-12-15 – 2017-12-16 (×2): 90 mg via ORAL
  Filled 2017-12-15 (×2): qty 3

## 2017-12-15 MED ORDER — ASPIRIN 81 MG PO CHEW
81.0000 mg | CHEWABLE_TABLET | Freq: Every day | ORAL | Status: DC
  Administered 2017-12-15 – 2017-12-16 (×2): 81 mg via ORAL
  Filled 2017-12-15 (×2): qty 1

## 2017-12-15 MED ORDER — MAGNESIUM OXIDE 400 (241.3 MG) MG PO TABS
400.0000 mg | ORAL_TABLET | Freq: Every day | ORAL | Status: DC
  Administered 2017-12-15 – 2017-12-16 (×2): 400 mg via ORAL
  Filled 2017-12-15 (×2): qty 1

## 2017-12-15 MED ORDER — ONDANSETRON HCL 4 MG PO TABS: 4.0000 mg | ORAL_TABLET | Freq: Four times a day (QID) | ORAL | Status: DC | PRN

## 2017-12-15 MED ORDER — ACETAMINOPHEN 325 MG PO TABS
650.0000 mg | ORAL_TABLET | Freq: Four times a day (QID) | ORAL | Status: DC | PRN
  Filled 2017-12-15 (×2): qty 2

## 2017-12-15 MED ORDER — MELATONIN 5 MG PO TABS
2.5000 mg | ORAL_TABLET | Freq: Every day | ORAL | Status: DC
  Administered 2017-12-15 (×2): 2.5 mg via ORAL
  Filled 2017-12-15 (×3): qty 0.5

## 2017-12-15 MED ORDER — POTASSIUM CHLORIDE CRYS ER 20 MEQ PO TBCR
40.0000 meq | EXTENDED_RELEASE_TABLET | ORAL | Status: DC
  Administered 2017-12-15: 40 meq via ORAL
  Filled 2017-12-15: qty 2

## 2017-12-15 MED ORDER — LOSARTAN POTASSIUM 25 MG PO TABS
25.0000 mg | ORAL_TABLET | Freq: Two times a day (BID) | ORAL | Status: DC
  Administered 2017-12-15: 25 mg via ORAL
  Filled 2017-12-15: qty 1

## 2017-12-15 MED ORDER — ACETAMINOPHEN 650 MG RE SUPP: 650.0000 mg | Freq: Four times a day (QID) | RECTAL | Status: DC | PRN

## 2017-12-15 MED ORDER — FUROSEMIDE 40 MG PO TABS: 20.0000 mg | ORAL_TABLET | Freq: Every day | ORAL | Status: DC

## 2017-12-15 MED ORDER — POTASSIUM CHLORIDE CRYS ER 20 MEQ PO TBCR
40.0000 meq | EXTENDED_RELEASE_TABLET | ORAL | Status: AC
  Administered 2017-12-15 (×2): 40 meq via ORAL
  Filled 2017-12-15 (×2): qty 2

## 2017-12-15 MED ORDER — ONDANSETRON HCL 4 MG/2ML IJ SOLN: 4.0000 mg | Freq: Four times a day (QID) | INTRAMUSCULAR | Status: DC | PRN

## 2017-12-15 NOTE — ED Notes (Signed)
Report called to floor, given to [place], Therapist, sports.

## 2017-12-15 NOTE — Progress Notes (Signed)
The patient is admitted to room 251 with the diagnosis of acute on chronic CHF. Alert and oriented x 4. Denied any acute pain at this time. The patient is oriented to her room, staff and ascom. Tele box called and verified to CCMD. Patient did not voiced any concern. Will continue to monitor.

## 2017-12-15 NOTE — Consult Note (Signed)
Reason for Consult: Shortness of breath congestive heart failure Referring Physician: Dr. Lance Coon hospitalist, Christel Mormon NP primary Cardiologist Dr. [name] Cortez is an 83 y.o. female.  HPI: Patient 83 year old white female history of non-Hodgkin's lymphoma status post treatment including bone marrow transplant patient has history of heart failure cardiomyopathy secondary to chemotherapy patient presents with shortness of breath had AICD placed because of depressed left ventricular function but recently had it removed at [facility] because of infection.  Patient complains of dyspnea since leaving the hospital many of her medications were discontinued because of hypotension during her septic hospitalization patient now has progressive dyspnea shortness of breath no chest pain.  Past Medical History:  Diagnosis Date  . AICD (automatic cardioverter/defibrillator) present    non-functioning due to no active battery in place  . Atrial fibrillation ([place])   . CHF (congestive heart failure) ([place])   . Depression   . 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 HTN (hypertension)   . Hypertension   . Neuropathy   . Nocturia   . Non-Hodgkin lymphoma ([place])    Bone Marrow Transplant with chemo + Rad tx's.  . Tachycardia   . Urge incontinence   . Urinary incontinence     Past Surgical History:  Procedure Laterality Date  . BONE MARROW TRANSPLANT    . COMPLICATED TOTAL HIP PROSTHESIS AND METHYLMETHACRALATE REMOVAL W/O SPACER INSERTION    . PERIPHERAL VASCULAR CATHETERIZATION Right 06/18/2016   Procedure: Lower Extremity Angiography;  Surgeon: Algernon Huxley, MD;  Location: [place] [facility];  Service: Cardiovascular;  Laterality: Right;  . PERIPHERAL VASCULAR CATHETERIZATION  06/18/2016   Procedure: Lower Extremity Intervention;  Surgeon: Algernon Huxley, MD;  Location: [place] [facility];  Service: Cardiovascular;;  . TOTAL HIP ARTHROPLASTY   [id number]    Family History  Problem Relation Age of Onset  . Osteosarcoma Father   . Heart disease Father   . Heart disease Mother   . Breast cancer Mother     Social History:  reports that  has never smoked. she has never used smokeless tobacco. She reports that she drinks alcohol. She reports that she does not use drugs.  Allergies:  Allergies  Allergen Reactions  . Penicillin V Potassium Hives    Has patient had a PCN reaction causing immediate rash, facial/tongue/throat swelling, SOB or lightheadedness with hypotension: Unknown Has patient had a PCN reaction causing severe rash involving mucus membranes or skin necrosis: Unknown Has patient had a PCN reaction that required hospitalization: Unknown Has patient had a PCN reaction occurring within the last 10 years: Unknown If all of the above answers are "NO", then may proceed with Cephalosporin use.   Marland Kitchen Prochlorperazine Nausea And Vomiting    DIZZINESS, DRUNK FEELING. DIZZINESS, DRUNK FEELING.  . Ramipril Other (See Comments)    SEVERE HEADACHE SEVERE HEADACHE  . Rifampin     Severe stomach irriatates cramping and nausea  . Sulfa Antibiotics     TOLD AS A CHILD, MADE PT NERVOUS, EMOTIONAL, TOLD NOT TO TAKE.  . Cephalexin Rash    RASH, ITCHING. RASH, ITCHING.    Medications: I have reviewed the patient's current medications.  Results for orders placed or performed during the hospital encounter of 12/14/17 (from the past 48 hour(s))  Basic metabolic panel     Status: Abnormal   Collection Time: 12/14/17  9:08 PM  Result Value Ref Range   Sodium 137  135 - 145 mmol/L   Potassium 3.1 (L) 3.5 - 5.1 mmol/L    Comment: HEMOLYSIS AT THIS LEVEL MAY AFFECT RESULT   Chloride 104 101 - 111 mmol/L   CO2 20 (L) 22 - 32 mmol/L   Glucose, Bld 110 (H) 65 - 99 mg/dL   BUN 12 6 - 20 mg/dL   Creatinine, Ser 0.99 0.44 - 1.00 mg/dL   Calcium 8.4 (L) 8.9 - 10.3 mg/dL   GFR calc non Af Amer 52 (L) >60 mL/min   GFR calc Af Amer >60  >60 mL/min    Comment: (NOTE) The eGFR has been calculated using the CKD EPI equation. This calculation has not been validated in all clinical situations. eGFR's persistently <60 mL/min signify possible Chronic Kidney Disease.    Anion gap 13 5 - 15    Comment: Performed at [facility], [address] [place], [place] [address]  CBC     Status: Abnormal   Collection Time: 12/14/17  9:08 PM  Result Value Ref Range   WBC 7.7 3.6 - 11.0 K/uL   RBC 3.59 (L) 3.80 - 5.20 MIL/uL   Hemoglobin 9.3 (L) 12.0 - 16.0 g/dL   HCT 29.6 (L) 35.0 - 47.0 %   MCV 82.4 80.0 - 100.0 fL   MCH 25.9 (L) 26.0 - 34.0 pg   MCHC 31.5 (L) 32.0 - 36.0 g/dL   RDW 19.3 (H) 11.5 - 14.5 %   Platelets 372 150 - 440 K/uL    Comment: Performed at [facility], [address] [place], [place] [address]  Troponin I     Status: None   Collection Time: 12/14/17  9:08 PM  Result Value Ref Range   Troponin I <0.03 <0.03 ng/mL    Comment: Performed at [facility], [address] [place], [place] [address]  Brain natriuretic peptide     Status: Abnormal   Collection Time: 12/14/17  9:08 PM  Result Value Ref Range   B Natriuretic Peptide 1,489.0 (H) 0.0 - 100.0 pg/mL    Comment: Performed at [facility], [address] [place], [place] [address]  Influenza panel by PCR (type A & B)     Status: None   Collection Time: 12/14/17 10:02 PM  Result Value Ref Range   Influenza A By PCR NEGATIVE NEGATIVE   Influenza B By PCR NEGATIVE NEGATIVE    Comment: (NOTE) The Xpert Xpress Flu assay is intended as an aid in the diagnosis of  influenza and should not be used as a sole basis for treatment.  This  assay is FDA approved for nasopharyngeal swab specimens only. Nasal  washings and aspirates are unacceptable for Xpert Xpress Flu testing. Performed at [facility], [address] [place], [place] [address]   MRSA PCR Screening     Status: None   Collection Time: 12/15/17   1:55 AM  Result Value Ref Range   MRSA by PCR NEGATIVE NEGATIVE    Comment:        The GeneXpert MRSA Assay (FDA approved for NASAL specimens only), is one component of a comprehensive MRSA colonization surveillance program. It is not intended to diagnose MRSA infection nor to guide or monitor treatment for MRSA infections. Performed at [facility], [address], [place], [place] [address]   Basic metabolic panel     Status: Abnormal   Collection Time: 12/15/17  5:01 AM  Result Value Ref Range   Sodium 141 135 -  145 mmol/L   Potassium 2.6 (LL) 3.5 - 5.1 mmol/L    Comment: CRITICAL RESULT CALLED TO, READ BACK BY AND VERIFIED WITH MICHELE RODGERS AT 8413 12/15/17.PMH   Chloride 101 101 - 111 mmol/L   CO2 28 22 - 32 mmol/L   Glucose, Bld 102 (H) 65 - 99 mg/dL   BUN 11 6 - 20 mg/dL   Creatinine, Ser 0.99 0.44 - 1.00 mg/dL   Calcium 8.5 (L) 8.9 - 10.3 mg/dL   GFR calc non Af Amer 52 (L) >60 mL/min   GFR calc Af Amer >60 >60 mL/min    Comment: (NOTE) The eGFR has been calculated using the CKD EPI equation. This calculation has not been validated in all clinical situations. eGFR's persistently <60 mL/min signify possible Chronic Kidney Disease.    Anion gap 12 5 - 15    Comment: Performed at [facility], [address] [place], [place] [address]  CBC     Status: Abnormal   Collection Time: 12/15/17  5:01 AM  Result Value Ref Range   WBC 9.0 3.6 - 11.0 K/uL   RBC 3.55 (L) 3.80 - 5.20 MIL/uL   Hemoglobin 9.4 (L) 12.0 - 16.0 g/dL   HCT 29.1 (L) 35.0 - 47.0 %   MCV 81.8 80.0 - 100.0 fL   MCH 26.4 26.0 - 34.0 pg   MCHC 32.3 32.0 - 36.0 g/dL   RDW 19.0 (H) 11.5 - 14.5 %   Platelets 368 150 - 440 K/uL    Comment: Performed at [facility], [address], [place], [place] [address]  Magnesium     Status: None   Collection Time: 12/15/17  5:01 AM  Result Value Ref Range   Magnesium 1.7 1.7 - 2.4 mg/dL    Comment: Performed at [facility], [address], [place], [place] [address]    Dg Chest 2 View  Result Date: 12/14/2017 CLINICAL DATA:  Acute shortness of breath. EXAM: CHEST  2 VIEW COMPARISON:  10/16/2017 and prior radiograph FINDINGS: Cardiomegaly noted. Mild bilateral interstitial opacities are identified suggestive of edema. Bibasilar atelectasis noted. There is no evidence of pneumothorax. There may be trace bilateral pleural effusions present. No acute bony abnormality identified. IMPRESSION: Cardiomegaly with mild bilateral interstitial opacities which may represent mild interstitial edema. Bibasilar atelectasis. Question trace bilateral pleural effusions. Electronically Signed   By: Margarette Canada M.D.   On: 12/14/2017 20:45    Review of Systems  Constitutional: Positive for malaise/fatigue.  HENT: Positive for congestion.   Eyes: Negative.   Respiratory: Positive for shortness of breath.   Cardiovascular: Positive for orthopnea, leg swelling and PND.  Gastrointestinal: Negative.   Genitourinary: Negative.   Musculoskeletal: Positive for joint pain and myalgias.  Skin: Negative.   Neurological: Positive for weakness.  Endo/Heme/Allergies: Negative.   Psychiatric/Behavioral: Negative.    Blood pressure 129/65, pulse 71, temperature (!) 97.5 F (36.4 C), temperature source Oral, resp. rate 20, height 5' 5"  (1.651 m), weight 130 lb (59 kg), SpO2 98 %. Physical Exam  Nursing note and vitals reviewed. Constitutional: She is oriented to person, place, and time. She appears well-developed and well-nourished.  HENT:  Head: Normocephalic and atraumatic.  Eyes: Conjunctivae and EOM are normal. Pupils are equal, round, and reactive to light.  Neck: Normal range of motion. Neck supple.  Cardiovascular: Normal rate and regular rhythm. Exam reveals gallop.  Murmur heard. Respiratory: Effort normal and breath sounds normal.  GI: Soft. Bowel sounds are normal.  Musculoskeletal: She exhibits deformity.  Neurological:  She is alert and oriented to person, place, and time. She has normal reflexes.  Skin: Skin is warm.  Psychiatric: She has a normal mood and affect.    Assessment/Plan: Congestive heart failure Cardiomyopathy Shortness of breath Hypertension Generalized weakness Hypoxemia Removal of hip joint secondary to MRSA History of atrial fibrillation Status post removal of AICD secondary to infection Non-Hodgkin's lymphoma status post treatment History of bone marrow . Plan Agree with admit to telemetry follow-up EKGs and troponins Recommend supplemental oxygen as necessary for shortness of breath Consider intravenous diuretic therapy for heart failure Restart low-dose digoxin Continue low-dose metoprolol therapy Start Cozaar 25 twice a day Continue anticoagulation for A. Fib Consider inhalers for shortness of breath  Jameila Keeny D Ramiro Pangilinan 12/15/2017, 5:25 PM

## 2017-12-15 NOTE — Progress Notes (Signed)
Potassium level  = 2.6, Dr. Marcille Blanco notified  With a new order for oral potassium as indicated in the [facility]. Patient was also complaining of leg cramps. New order to add Mg lab to the previous lab received from Dr. Marcille Blanco. Patient refused to take medication for that. Will continue to monitor.

## 2017-12-15 NOTE — Plan of Care (Signed)
Pt is A&Ox4. VSS . Pt weaned to 1L O2 [place] this shift from 2L . Family at bedside. No complaints thus far. Will continue to monitor and report to oncoming RN .  Progressing Education: Knowledge of General Education information will improve 12/15/2017 1407 - Progressing by Aleen Campi, RN Health Behavior/Discharge Planning: Ability to manage health-related needs will improve 12/15/2017 1407 - Progressing by Aleen Campi, RN Clinical Measurements: Ability to maintain clinical measurements within normal limits will improve 12/15/2017 1407 - Progressing by Aleen Campi, RN Will remain free from infection 12/15/2017 1407 - Progressing by Aleen Campi, RN Diagnostic test results will improve 12/15/2017 1407 - Progressing by Aleen Campi, RN Respiratory complications will improve 12/15/2017 1407 - Progressing by Aleen Campi, RN Cardiovascular complication will be avoided 12/15/2017 1407 - Progressing by Aleen Campi, RN Activity: Risk for activity intolerance will decrease 12/15/2017 1407 - Progressing by Aleen Campi, RN Nutrition: Adequate nutrition will be maintained 12/15/2017 1407 - Progressing by Aleen Campi, RN Coping: Level of anxiety will decrease 12/15/2017 1407 - Progressing by Aleen Campi, RN Elimination: Will not experience complications related to bowel motility 12/15/2017 1407 - Progressing by Aleen Campi, RN Will not experience complications related to urinary retention 12/15/2017 1407 - Progressing by Aleen Campi, RN Pain Managment: General experience of comfort will improve 12/15/2017 1407 - Progressing by Aleen Campi, RN Safety: Ability to remain free from injury will improve 12/15/2017 1407 - Progressing by Aleen Campi, RN Skin Integrity: Risk for impaired skin integrity will decrease 12/15/2017 1407 - Progressing by Aleen Campi, RN Education: Ability to demonstrate management of disease process will improve 12/15/2017 1407 - Progressing by Aleen Campi, RN Ability to verbalize understanding of medication therapies will improve 12/15/2017 1407 - Progressing by Aleen Campi, RN Activity: Capacity to carry out activities will improve 12/15/2017 1407 - Progressing by Aleen Campi, RN Cardiac: Ability to achieve and maintain adequate cardiopulmonary perfusion will improve 12/15/2017 1407 - Progressing by Aleen Campi, RN

## 2017-12-15 NOTE — ED Notes (Signed)
Attempted to call report, nurse in isolation room. 

## 2017-12-15 NOTE — Progress Notes (Signed)
Advance care planning  Met with patient and daughter-in-law at bedside.  We discussed regarding her acute on chronic systolic congestive heart failure, Hodgkin's lymphoma.  Patient mentions that she has been more short of breath over the last few days.  Does not wear home oxygen.  We discussed regarding the present plan of IV Lasix with diuresis and hopefully wean her off the oxygen by tomorrow.  She is also requesting to have her cardiologist Dr. Ubaldo Glassing see her in the hospital. Patient tells me that she is 83 years old but has non-Hodgkin's lymphoma, cardiac problems and would like to be DO NOT RESUSCITATE and DO NOT INTUBATE.  Her healthcare power of attorney is her eldest son.  She has discussed with family members and they are aware of her wishes.  Orders entered for DNR status.  Time spent 20 minutes.

## 2017-12-15 NOTE — ED Notes (Signed)
Transport to room 251.AS

## 2017-12-15 NOTE — Progress Notes (Signed)
Kathleen at [place] NAME: Cali Hope    MR#:  [id number]  DATE OF BIRTH:  September 13, 1935  SUBJECTIVE:  CHIEF COMPLAINT:   Chief Complaint  Patient presents with  . Shortness of Breath   Still has shortness of breath.  On 2 L oxygen.  Does not wear oxygen at home.  Afebrile.  Increased urine output.  REVIEW OF SYSTEMS:    Review of Systems  Constitutional: Positive for malaise/fatigue. Negative for chills and fever.  HENT: Negative for sore throat.   Eyes: Negative for blurred vision, double vision and pain.  Respiratory: Negative for cough, hemoptysis, shortness of breath and wheezing.   Cardiovascular: Negative for chest pain, palpitations, orthopnea and leg swelling.  Gastrointestinal: Negative for abdominal pain, constipation, diarrhea, heartburn, nausea and vomiting.  Genitourinary: Negative for dysuria and hematuria.  Musculoskeletal: Negative for back pain and joint pain.  Skin: Negative for rash.  Neurological: Positive for weakness. Negative for sensory change, speech change, focal weakness and headaches.  Endo/Heme/Allergies: Does not bruise/bleed easily.  Psychiatric/Behavioral: Negative for depression. The patient is not nervous/anxious.     DRUG ALLERGIES:   Allergies  Allergen Reactions  . Penicillin V Potassium Hives    Has patient had a PCN reaction causing immediate rash, facial/tongue/throat swelling, SOB or lightheadedness with hypotension: Unknown Has patient had a PCN reaction causing severe rash involving mucus membranes or skin necrosis: Unknown Has patient had a PCN reaction that required hospitalization: Unknown Has patient had a PCN reaction occurring within the last 10 years: Unknown If all of the above answers are "NO", then may proceed with Cephalosporin use.   Marland Kitchen Prochlorperazine Nausea And Vomiting    DIZZINESS, DRUNK FEELING. DIZZINESS, DRUNK FEELING.  . Ramipril Other (See Comments)    SEVERE  HEADACHE SEVERE HEADACHE  . Rifampin     Severe stomach irriatates cramping and nausea  . Sulfa Antibiotics     TOLD AS A CHILD, MADE PT NERVOUS, EMOTIONAL, TOLD NOT TO TAKE.  . Cephalexin Rash    RASH, ITCHING. RASH, ITCHING.    VITALS:  Blood pressure 129/65, pulse 71, temperature (!) 97.5 F (36.4 C), temperature source Oral, resp. rate 20, height 5\' 5"  (1.651 m), weight 59 kg (130 lb), SpO2 98 %.  PHYSICAL EXAMINATION:   Physical Exam  GENERAL:  83 y.o.-year-old patient lying in the bed with no acute distress.  EYES: Pupils equal, round, reactive to light and accommodation. No scleral icterus. Extraocular muscles intact.  HEENT: Head atraumatic, normocephalic. Oropharynx and nasopharynx clear.  NECK:  Supple, no jugular venous distention. No thyroid enlargement, no tenderness.  LUNGS: Bilateral coarse crackles CARDIOVASCULAR: S1, S2 normal. No murmurs, rubs, or gallops.  ABDOMEN: Soft, nontender, nondistended. Bowel sounds present. No organomegaly or mass.  EXTREMITIES: No cyanosis, clubbing or edema b/l.    NEUROLOGIC: Cranial nerves II through XII are intact. No focal Motor or sensory deficits b/l.   PSYCHIATRIC: The patient is alert and oriented x 3.  SKIN: No obvious rash, lesion, or ulcer.   LABORATORY PANEL:   CBC Recent Labs  Lab 12/15/17 0501  WBC 9.0  HGB 9.4*  HCT 29.1*  PLT 368   ------------------------------------------------------------------------------------------------------------------ Chemistries  Recent Labs  Lab 12/15/17 0501  NA 141  K 2.6*  CL 101  CO2 28  GLUCOSE 102*  BUN 11  CREATININE 0.99  CALCIUM 8.5*  MG 1.7   ------------------------------------------------------------------------------------------------------------------  Cardiac Enzymes Recent Labs  Lab 12/14/17 2108  TROPONINI <  0.03   ------------------------------------------------------------------------------------------------------------------  RADIOLOGY:   Dg Chest 2 View  Result Date: 12/14/2017 CLINICAL DATA:  Acute shortness of breath. EXAM: CHEST  2 VIEW COMPARISON:  10/16/2017 and prior radiograph FINDINGS: Cardiomegaly noted. Mild bilateral interstitial opacities are identified suggestive of edema. Bibasilar atelectasis noted. There is no evidence of pneumothorax. There may be trace bilateral pleural effusions present. No acute bony abnormality identified. IMPRESSION: Cardiomegaly with mild bilateral interstitial opacities which may represent mild interstitial edema. Bibasilar atelectasis. Question trace bilateral pleural effusions. Electronically Signed   By: Margarette Canada M.D.   On: 12/14/2017 20:45     ASSESSMENT AND PLAN:   *Acute on chronic systolic congestive heart failure - IV Lasix, Beta blockers - Input and Output - Counseled to limit fluids and Salt - Monitor Bun/Cr and Potassium - Echo reviewed -Cardiology consult  *Severe hypokalemia with muscle cramps.  Replace through oral and IV.  *Hypertension.  Continue home medications.  *Paroxysmal atrial fibrillation.  Continue rate control medications and anticoagulation  All the records are reviewed and case discussed with Care Management/Social Workerr. Management plans discussed with the patient, family and they are in agreement.  CODE STATUS: DNR  DVT Prophylaxis: SCDs  TOTAL TIME TAKING CARE OF THIS PATIENT: 350 minutes.   POSSIBLE D/C IN 2-3 DAYS, DEPENDING ON CLINICAL CONDITION.  Leia Alf Adelie Croswell M.D on 12/15/2017 at 12:58 PM  Between 7am to 6pm - Pager - [phone]  After 6pm go to [email or web address] - password [email or web address] [facility]  Office  [phone]  CC: Primary care physician; Marinda Elk, MD  Note: This dictation was prepared with Dragon dictation along with smaller phrase technology. Any transcriptional errors that result from this process are unintentional.

## 2017-12-16 ENCOUNTER — Inpatient Hospital Stay: Payer: Medicare Other

## 2017-12-16 LAB — BASIC METABOLIC PANEL
ANION GAP: 10 (ref 5–15)
BUN: 10 mg/dL (ref 6–20)
CALCIUM: 8.3 mg/dL — AB (ref 8.9–10.3)
CHLORIDE: 98 mmol/L — AB (ref 101–111)
CO2: 27 mmol/L (ref 22–32)
Creatinine, Ser: 1.2 mg/dL — ABNORMAL HIGH (ref 0.44–1.00)
GFR calc non Af Amer: 42 mL/min — ABNORMAL LOW (ref >60)
GFR, EST AFRICAN AMERICAN: 48 mL/min — AB (ref >60)
Glucose, Bld: 87 mg/dL (ref 65–99)
POTASSIUM: 3.6 mmol/L (ref 3.5–5.1)
Sodium: 135 mmol/L (ref 135–145)

## 2017-12-16 LAB — MAGNESIUM: Magnesium: 1.7 mg/dL (ref 1.7–2.4)

## 2017-12-16 MED ORDER — FUROSEMIDE 40 MG PO TABS
40.0000 mg | ORAL_TABLET | Freq: Every day | ORAL | Status: DC
  Administered 2017-12-16: 40 mg via ORAL
  Filled 2017-12-16: qty 1

## 2017-12-16 MED ORDER — ATORVASTATIN CALCIUM 20 MG PO TABS: 20.0000 mg | ORAL_TABLET | Freq: Every day | ORAL | 0 refills | Status: DC

## 2017-12-16 NOTE — NC FL2 (Signed)
[place] LEVEL OF CARE SCREENING TOOL     IDENTIFICATION  Patient Name: Mindy Cortez Birthdate: [birth year] Sex: female Admission Date (Current Location): 12/14/2017  [place] and [place] Number:  Engineering geologist and Address:  [facility], [address], [place], [place] [address]      Provider Number: [id number]  Attending Physician Name and Address:  Bettey Costa, MD  Relative Name and Phone Number:  [name] Daughter   [phone]     Current Level of Care: Hospital Recommended Level of Care: Memory Care, Assisted Living Facility([facility] ) Prior Approval Number:    Date Approved/Denied:   PASRR Number:    Discharge Plan: Other (Comment)([facility])    Current Diagnoses: Patient Active Problem List   Diagnosis Date Noted  . Acute on chronic systolic CHF (congestive heart failure) ([place]) 12/14/2017  . AF (paroxysmal atrial fibrillation) ([place]) 12/14/2017  . MRSA bacteremia 10/16/2017  . Septic shock ([place])   . Right lower quadrant abdominal pain   . Pressure injury of skin 10/15/2017  . Sepsis ([place]) 10/10/2017  . Atherosclerosis of native arteries of the extremities with ulceration ([place]) 12/11/2016  . Acquired absence of right hip 04/08/2015  . Bilateral cataracts 04/08/2015  . CCF (congestive cardiac failure) ([place]) 04/08/2015  . Clinical depression 04/08/2015  . Glaucoma 04/08/2015  . HTN (hypertension) 04/08/2015  . Neuropathy 04/08/2015  . Insomnia, persistent 03/16/2015  . Mixed Alzheimer's and vascular dementia 03/16/2015  . Atrial fibrillation with rapid ventricular response ([place]) 02/08/2015  . B12 deficiency 08/02/2014  . Leg pain, right 06/09/2014  . Leg numbness 06/09/2014  . Endomyocardial disease ([place]) 11/13/2006  . Cardiac failure left ([place]) 11/13/2006  . Lymphoma, non-Hodgkin's ([place]) 11/13/2006  . Infection and inflammatory reaction due to internal prosthetic device, implant, and  graft 11/13/2006  . Infection of breast implant ([place]) 11/13/2006  . Lymphoma of extranodal and solid organ sites ([place]) 11/13/2006  . Cardiomyopathy ([place]) 11/13/2006    Orientation RESPIRATION BLADDER Height & Weight     Self  Normal Incontinent Weight: 121 lb 3.2 oz (55 kg) Height:  5\' 5"  (165.1 cm)  BEHAVIORAL SYMPTOMS/MOOD NEUROLOGICAL BOWEL NUTRITION STATUS      Continent Diet(Cardiac)  AMBULATORY STATUS COMMUNICATION OF NEEDS Skin   Supervision Verbally Normal                       Personal Care Assistance Level of Assistance  Bathing, Feeding, Dressing Bathing Assistance: Limited assistance Feeding assistance: Independent Dressing Assistance: Limited assistance     Functional Limitations Info  Sight, Hearing, Speech Sight Info: Adequate Hearing Info: Adequate Speech Info: Adequate    SPECIAL CARE FACTORS FREQUENCY  PT (By licensed PT)     PT Frequency: Home Health minimum 1x a week              Contractures Contractures Info: Not present    Additional Factors Info  Code Status, Allergies Code Status Info: DNR Allergies Info: PENICILLIN V POTASSIUM, PROCHLORPERAZINE, RAMIPRIL, RIFAMPIN, SULFA ANTIBIOTICS, CEPHALEXIN            Current Medications (12/16/2017):  This is the current hospital active medication list Current Facility-Administered Medications  Medication Dose Route Frequency Provider Last Rate Last Dose  . acetaminophen (TYLENOL) tablet 650 mg  650 mg Oral Q6H PRN Lance Coon, MD       Or  . acetaminophen (TYLENOL) suppository 650 mg  650 mg Rectal Q6H PRN Lance Coon,  MD      . apixaban Arne Cleveland) tablet 2.5 mg  2.5 mg Oral BID Lance Coon, MD   2.5 mg at 12/16/17 0905  . aspirin chewable tablet 81 mg  81 mg Oral Daily Lance Coon, MD   81 mg at 12/16/17 8657  . cyclobenzaprine (FLEXERIL) tablet 5 mg  5 mg Oral BID PRN Hillary Bow, MD   5 mg at 12/15/17 2009  . donepezil (ARICEPT) tablet 10 mg  10 mg Oral Corwin Levins, MD    10 mg at 12/15/17 2208  . DULoxetine (CYMBALTA) DR capsule 90 mg  90 mg Oral Daily Lance Coon, MD   90 mg at 12/16/17 0906  . furosemide (LASIX) tablet 40 mg  40 mg Oral Daily Bettey Costa, MD   40 mg at 12/16/17 0906  . magnesium oxide (MAG-OX) tablet 400 mg  400 mg Oral Daily Hillary Bow, MD   400 mg at 12/16/17 0906  . Melatonin TABS 2.5 mg  2.5 mg Oral Corwin Levins, MD   2.5 mg at 12/15/17 2207  . metoprolol tartrate (LOPRESSOR) tablet 12.5 mg  12.5 mg Oral BID Lance Coon, MD   12.5 mg at 12/16/17 0905  . ondansetron (ZOFRAN) tablet 4 mg  4 mg Oral Q6H PRN Lance Coon, MD       Or  . ondansetron [employer]) injection 4 mg  4 mg Intravenous Q6H PRN Lance Coon, MD         Discharge Medications: STOP taking these medications   Vancomycin 750-5 MG/150ML-% Soln Commonly known as:  VANCOCIN     TAKE these medications   acetaminophen 325 MG tablet Commonly known as:  TYLENOL Take 650 mg by mouth every 6 (six) hours as needed.   acidophilus Caps capsule Take 1 capsule by mouth daily.   apixaban 2.5 MG Tabs tablet Commonly known as:  ELIQUIS Take 2.5 mg by mouth 2 (two) times daily.   aspirin 81 MG chewable tablet Chew 81 mg by mouth daily.   atorvastatin 20 MG tablet Commonly known as:  LIPITOR Take 1 tablet (20 mg total) by mouth daily.   cholecalciferol 1000 units tablet Commonly known as:  VITAMIN D Take 1,000 Units by mouth daily.   donepezil 10 MG tablet Commonly known as:  ARICEPT Take 10 mg by mouth at bedtime.   DULoxetine 30 MG capsule Commonly known as:  CYMBALTA Take 3 capsules (90 mg total) by mouth daily.   ferrous sulfate 325 (65 FE) MG tablet Take 325 mg by mouth daily with breakfast.   furosemide 20 MG tablet Commonly known as:  LASIX Take 20 mg by mouth daily as needed for edema.   magnesium oxide 400 MG tablet Commonly known as:  MAG-OX Take 400 mg by mouth daily.   Melatonin 3 MG Tbdp Take 3 mg by mouth at  bedtime.   metoprolol tartrate 25 MG tablet Commonly known as:  LOPRESSOR Take 0.5 tablets (12.5 mg total) by mouth 2 (two) times daily.   multivitamin with minerals Tabs tablet Take 1 tablet by mouth daily.      Relevant Imaging Results:  Relevant Lab Results:   Additional Information SSN [id number]  Ross Ludwig, [place]

## 2017-12-16 NOTE — Care Management (Addendum)
Patient is currently being followed by [facility].  Agency will initiated CHF protocol and will see patient within 24 hours of discharge.  Discussed the need to instruct ALF staff on management of CHF and interventions.  Attempted to enter correct disposition but would not save- DC with home health to [facility]

## 2017-12-16 NOTE — Progress Notes (Signed)
SATURATION QUALIFICATIONS: (This note is used to comply with regulatory documentation for home oxygen)  Patient Saturations on Room Air at Rest =100%  Patient Saturations on Room Air while Ambulating = 97%  Patient Saturations on 000 Liters of oxygen while Ambulating = 097%  Please briefly explain why patient needs home oxygen:

## 2017-12-16 NOTE — Progress Notes (Signed)
At reat on room air

## 2017-12-16 NOTE — Progress Notes (Signed)
Patient discharged home with home health as ordered,instructions explained and well understood,follow up appointments made,escorted by staff member and family via wheel chair.

## 2017-12-16 NOTE — Discharge Summary (Signed)
[place] [facility] [place] NAME: Mindy Cortez    MR#:  [id number]  DATE OF BIRTH:  [birth year]  DATE OF ADMISSION:  12/14/2017 ADMITTING PHYSICIAN: Lance Coon, MD  DATE OF DISCHARGE: 12/16/2017  PRIMARY CARE PHYSICIAN: Marinda Elk, MD    ADMISSION DIAGNOSIS:  Acute congestive heart failure, unspecified heart failure type ([place]) [I50.9]  DISCHARGE DIAGNOSIS:  Principal Problem:   Acute on chronic systolic CHF (congestive heart failure) (HCC) Active Problems:   Clinical depression   HTN (hypertension)   AF (paroxysmal atrial fibrillation) ([place])   SECONDARY DIAGNOSIS:   Past Medical History:  Diagnosis Date  . AICD (automatic cardioverter/defibrillator) present    non-functioning due to no active battery in place  . Atrial fibrillation ([place])   . CHF (congestive heart failure) ([place])   . Depression   . 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 HTN (hypertension)   . Hypertension   . Neuropathy   . Nocturia   . Non-Hodgkin lymphoma ([place])    Bone Marrow Transplant with chemo + Rad tx's.  . Tachycardia   . Urge incontinence   . Urinary incontinence     HOSPITAL COURSE:   83 y/o female with Hx of NHL and recent infection of AICD who was brought to the hospital for SOB.  1. Acute on chronic systolic heart failure with EF 30%  Patient was diuresed with IV Lasix. Patient was evaluated by cardiology. Patient is now euvolemic. Patient will continue with Lasix 20 mg daily. She was referred to CHF clinic upon discharge. Patient could not tolerate ACE inhibitor/ARB due to low blood pressure. 2. PAF: Patient will continue with metoprolol. Cardiology did recommend starting digoxin. Because her creatinine was 1.20 at the time of discharge this was not prescribed. She can follow-up with her cardiologist in sleep digoxin would be a good medication for this patient. She will continue  anticoagulation with Eliquis 3. Essential hypertension: Patient will continue on lisinopril  4. Dementia: Continue donepezil  5. Depression: Continue Cymbalta  DISCHARGE CONDITIONS AND DIET:   Stable for discharge on heart healthy diet  CONSULTS OBTAINED:  Treatment Team:  Yolonda Kida, MD  DRUG ALLERGIES:   Allergies  Allergen Reactions  . Penicillin V Potassium Hives    Has patient had a PCN reaction causing immediate rash, facial/tongue/throat swelling, SOB or lightheadedness with hypotension: Unknown Has patient had a PCN reaction causing severe rash involving mucus membranes or skin necrosis: Unknown Has patient had a PCN reaction that required hospitalization: Unknown Has patient had a PCN reaction occurring within the last 10 years: Unknown If all of the above answers are "NO", then may proceed with Cephalosporin use.   Marland Kitchen Prochlorperazine Nausea And Vomiting    DIZZINESS, DRUNK FEELING. DIZZINESS, DRUNK FEELING.  . Ramipril Other (See Comments)    SEVERE HEADACHE SEVERE HEADACHE  . Rifampin     Severe stomach irriatates cramping and nausea  . Sulfa Antibiotics     TOLD AS A CHILD, MADE PT NERVOUS, EMOTIONAL, TOLD NOT TO TAKE.  . Cephalexin Rash    RASH, ITCHING. RASH, ITCHING.    DISCHARGE MEDICATIONS:   Allergies as of 12/16/2017      Reactions   Penicillin V Potassium Hives   Has patient had a PCN reaction causing immediate rash, facial/tongue/throat swelling, SOB or lightheadedness with hypotension: Unknown Has patient had a PCN reaction causing severe rash involving mucus membranes  or skin necrosis: Unknown Has patient had a PCN reaction that required hospitalization: Unknown Has patient had a PCN reaction occurring within the last 10 years: Unknown If all of the above answers are "NO", then may proceed with Cephalosporin use.   Prochlorperazine Nausea And Vomiting   DIZZINESS, DRUNK FEELING. DIZZINESS, DRUNK FEELING.   Ramipril Other (See  Comments)   SEVERE HEADACHE SEVERE HEADACHE   Rifampin    Severe stomach irriatates cramping and nausea   Sulfa Antibiotics    TOLD AS A CHILD, MADE PT NERVOUS, EMOTIONAL, TOLD NOT TO TAKE.   Cephalexin Rash   RASH, ITCHING. RASH, ITCHING.      Medication List    STOP taking these medications   Vancomycin 750-5 MG/150ML-% Soln Commonly known as:  VANCOCIN     TAKE these medications   acetaminophen 325 MG tablet Commonly known as:  TYLENOL Take 650 mg by mouth every 6 (six) hours as needed.   acidophilus Caps capsule Take 1 capsule by mouth daily.   apixaban 2.5 MG Tabs tablet Commonly known as:  ELIQUIS Take 2.5 mg by mouth 2 (two) times daily.   aspirin 81 MG chewable tablet Chew 81 mg by mouth daily.   atorvastatin 20 MG tablet Commonly known as:  LIPITOR Take 1 tablet (20 mg total) by mouth daily.   cholecalciferol 1000 units tablet Commonly known as:  VITAMIN D Take 1,000 Units by mouth daily.   donepezil 10 MG tablet Commonly known as:  ARICEPT Take 10 mg by mouth at bedtime.   DULoxetine 30 MG capsule Commonly known as:  CYMBALTA Take 3 capsules (90 mg total) by mouth daily.   ferrous sulfate 325 (65 FE) MG tablet Take 325 mg by mouth daily with breakfast.   furosemide 20 MG tablet Commonly known as:  LASIX Take 20 mg by mouth daily as needed for edema.   magnesium oxide 400 MG tablet Commonly known as:  MAG-OX Take 400 mg by mouth daily.   Melatonin 3 MG Tbdp Take 3 mg by mouth at bedtime.   metoprolol tartrate 25 MG tablet Commonly known as:  LOPRESSOR Take 0.5 tablets (12.5 mg total) by mouth 2 (two) times daily.   multivitamin with minerals Tabs tablet Take 1 tablet by mouth daily.         Today   CHIEF COMPLAINT:   Patient is not short of breath. She says she's feeling much better.   VITAL SIGNS:  Blood pressure (!) 92/44, pulse 67, temperature 98.2 F (36.8 C), temperature source Oral, resp. rate 18, height 5\' 5"  (1.651  m), weight 55 kg (121 lb 3.2 oz), SpO2 91 %.   REVIEW OF SYSTEMS:  Review of Systems  Constitutional: Negative.  Negative for chills, fever and malaise/fatigue.  HENT: Negative.  Negative for ear discharge, ear pain, hearing loss, nosebleeds and sore throat.   Eyes: Negative.  Negative for blurred vision and pain.  Respiratory: Negative.  Negative for cough, hemoptysis, shortness of breath and wheezing.   Cardiovascular: Negative.  Negative for chest pain, palpitations and leg swelling.  Gastrointestinal: Negative.  Negative for abdominal pain, blood in stool, diarrhea, nausea and vomiting.  Genitourinary: Negative.  Negative for dysuria.  Musculoskeletal: Negative.  Negative for back pain.  Skin: Negative.   Neurological: Negative for dizziness, tremors, speech change, focal weakness, seizures and headaches.  Endo/Heme/Allergies: Negative.  Does not bruise/bleed easily.  Psychiatric/Behavioral: Positive for memory loss. Negative for depression, hallucinations and suicidal ideas.     PHYSICAL  EXAMINATION:  GENERAL:  83 y.o.-year-old patient lying in the bed with no acute distress.  NECK:  Supple, no jugular venous distention. No thyroid enlargement, no tenderness.  LUNGS: Normal breath sounds bilaterally, no wheezing, rales,rhonchi  No use of accessory muscles of respiration.  CARDIOVASCULAR: S1, S2 normal. No murmurs, rubs, or gallops.  ABDOMEN: Soft, non-tender, non-distended. Bowel sounds present. No organomegaly or mass.  EXTREMITIES: No pedal edema, cyanosis, or clubbing.  PSYCHIATRIC: The patient is alert and oriented x 3.  SKIN: No obvious rash, lesion, or ulcer.   DATA REVIEW:   CBC Recent Labs  Lab 12/15/17 0501  WBC 9.0  HGB 9.4*  HCT 29.1*  PLT 368    Chemistries  Recent Labs  Lab 12/16/17 0420  NA 135  K 3.6  CL 98*  CO2 27  GLUCOSE 87  BUN 10  CREATININE 1.20*  CALCIUM 8.3*  MG 1.7    Cardiac Enzymes Recent Labs  Lab 12/14/17 2108  TROPONINI  <0.03    Microbiology Results  @[email or web address] @  RADIOLOGY:  Dg Chest 1 View  Result Date: 12/16/2017 CLINICAL DATA:  Dyspnea, history of non-Hodgkin's lymphoma EXAM: CHEST 1 VIEW COMPARISON:  12/14/2017 FINDINGS: Diffuse bilateral interstitial thickening. No pleural effusion or pneumothorax. No focal consolidation. Stable cardiomegaly. No acute osseous abnormality. IMPRESSION: Mild CHF. Electronically Signed   By: Kathreen Devoid   On: 12/16/2017 08:25   Dg Chest 2 View  Result Date: 12/14/2017 CLINICAL DATA:  Acute shortness of breath. EXAM: CHEST  2 VIEW COMPARISON:  10/16/2017 and prior radiograph FINDINGS: Cardiomegaly noted. Mild bilateral interstitial opacities are identified suggestive of edema. Bibasilar atelectasis noted. There is no evidence of pneumothorax. There may be trace bilateral pleural effusions present. No acute bony abnormality identified. IMPRESSION: Cardiomegaly with mild bilateral interstitial opacities which may represent mild interstitial edema. Bibasilar atelectasis. Question trace bilateral pleural effusions. Electronically Signed   By: Margarette Canada M.D.   On: 12/14/2017 20:45      Allergies as of 12/16/2017      Reactions   Penicillin V Potassium Hives   Has patient had a PCN reaction causing immediate rash, facial/tongue/throat swelling, SOB or lightheadedness with hypotension: Unknown Has patient had a PCN reaction causing severe rash involving mucus membranes or skin necrosis: Unknown Has patient had a PCN reaction that required hospitalization: Unknown Has patient had a PCN reaction occurring within the last 10 years: Unknown If all of the above answers are "NO", then may proceed with Cephalosporin use.   Prochlorperazine Nausea And Vomiting   DIZZINESS, DRUNK FEELING. DIZZINESS, DRUNK FEELING.   Ramipril Other (See Comments)   SEVERE HEADACHE SEVERE HEADACHE   Rifampin    Severe stomach irriatates cramping and nausea   Sulfa Antibiotics    TOLD AS A  CHILD, MADE PT NERVOUS, EMOTIONAL, TOLD NOT TO TAKE.   Cephalexin Rash   RASH, ITCHING. RASH, ITCHING.      Medication List    STOP taking these medications   Vancomycin 750-5 MG/150ML-% Soln Commonly known as:  VANCOCIN     TAKE these medications   acetaminophen 325 MG tablet Commonly known as:  TYLENOL Take 650 mg by mouth every 6 (six) hours as needed.   acidophilus Caps capsule Take 1 capsule by mouth daily.   apixaban 2.5 MG Tabs tablet Commonly known as:  ELIQUIS Take 2.5 mg by mouth 2 (two) times daily.   aspirin 81 MG chewable tablet Chew 81 mg by mouth daily.   atorvastatin 20 MG  tablet Commonly known as:  LIPITOR Take 1 tablet (20 mg total) by mouth daily.   cholecalciferol 1000 units tablet Commonly known as:  VITAMIN D Take 1,000 Units by mouth daily.   donepezil 10 MG tablet Commonly known as:  ARICEPT Take 10 mg by mouth at bedtime.   DULoxetine 30 MG capsule Commonly known as:  CYMBALTA Take 3 capsules (90 mg total) by mouth daily.   ferrous sulfate 325 (65 FE) MG tablet Take 325 mg by mouth daily with breakfast.   furosemide 20 MG tablet Commonly known as:  LASIX Take 20 mg by mouth daily as needed for edema.   magnesium oxide 400 MG tablet Commonly known as:  MAG-OX Take 400 mg by mouth daily.   Melatonin 3 MG Tbdp Take 3 mg by mouth at bedtime.   metoprolol tartrate 25 MG tablet Commonly known as:  LOPRESSOR Take 0.5 tablets (12.5 mg total) by mouth 2 (two) times daily.   multivitamin with minerals Tabs tablet Take 1 tablet by mouth daily.          Management plans discussed with the patient and she is in agreement. Stable for discharge   Patient should follow up with cardiology  CODE STATUS:     Code Status Orders  (From admission, onward)        Start     Ordered   12/15/17 1315  Do not attempt resuscitation (DNR)  Continuous    Question Answer Comment  In the event of cardiac or respiratory ARREST Do not call a  "code blue"   In the event of cardiac or respiratory ARREST Do not perform Intubation, CPR, defibrillation or ACLS   In the event of cardiac or respiratory ARREST Use medication by any route, position, wound care, and other measures to relive pain and suffering. May use oxygen, suction and manual treatment of airway obstruction as needed for comfort.      12/15/17 1316    Code Status History    Date Active Date Inactive Code Status Order ID Comments User Context   12/15/2017 01:19 12/15/2017 13:16 Full Code [id number]  Lance Coon, MD ED   10/15/2017 17:44 10/17/2017 20:34 Full Code [id number]  Nicholes Mango, MD ED   10/10/2017 20:11 10/12/2017 18:34 Full Code [id number]  Demetrios Loll, MD Inpatient   06/18/2016 12:53 06/18/2016 18:33 Full Code [id number]  Algernon Huxley, MD Inpatient    Advance Directive Documentation     Most Recent Value  Type of Advance Directive  Healthcare Power of Attorney  Pre-existing out of facility DNR order (yellow form or pink MOST form)  No data  "MOST" Form in Place?  No data      TOTAL TIME TAKING CARE OF THIS PATIENT: 37 minutes.    Note: This dictation was prepared with Dragon dictation along with smaller phrase technology. Any transcriptional errors that result from this process are unintentional.  Leanah Kolander M.D on 12/16/2017 at 8:54 AM  Between 7am to 6pm - Pager - [phone] After 6pm go to [email or web address] - password [email or web address] [facility]  Office  [phone]  CC: Primary care physician; Marinda Elk, MD

## 2017-12-17 ENCOUNTER — Encounter: Payer: Self-pay | Admitting: Family

## 2017-12-25 ENCOUNTER — Ambulatory Visit: Payer: Medicare Other | Admitting: Family

## 2018-01-07 ENCOUNTER — Emergency Department
Admission: EM | Admit: 2018-01-07 | Discharge: 2018-01-07 | Disposition: A | Discharge status: Home / Self Care | Payer: Medicare Other

## 2018-01-07 NOTE — ED Notes (Signed)
Family states the bleeding has stopped to foot  family states she was going to take her back to [facility]    Will be seen by family PCP tomorrow

## 2018-01-15 ENCOUNTER — Encounter: Payer: Self-pay | Admitting: Emergency Medicine

## 2018-01-15 ENCOUNTER — Emergency Department: Payer: Medicare Other

## 2018-01-15 ENCOUNTER — Other Ambulatory Visit: Payer: Self-pay

## 2018-01-15 ENCOUNTER — Emergency Department
Admission: EM | Admit: 2018-01-15 | Discharge: 2018-01-15 | Disposition: A | Discharge status: Home / Self Care | Payer: Medicare Other | Attending: Emergency Medicine | Admitting: Emergency Medicine

## 2018-01-15 DIAGNOSIS — E876 Hypokalemia: Secondary | ICD-10-CM | POA: Diagnosis not present

## 2018-01-15 DIAGNOSIS — Z96649 Presence of unspecified artificial hip joint: Secondary | ICD-10-CM | POA: Insufficient documentation

## 2018-01-15 DIAGNOSIS — I110 Hypertensive heart disease with heart failure: Secondary | ICD-10-CM | POA: Diagnosis not present

## 2018-01-15 DIAGNOSIS — R109 Unspecified abdominal pain: Secondary | ICD-10-CM | POA: Diagnosis present

## 2018-01-15 DIAGNOSIS — Z79899 Other long term (current) drug therapy: Secondary | ICD-10-CM | POA: Diagnosis not present

## 2018-01-15 DIAGNOSIS — Z950 Presence of cardiac pacemaker: Secondary | ICD-10-CM | POA: Insufficient documentation

## 2018-01-15 DIAGNOSIS — I5089 Other heart failure: Secondary | ICD-10-CM | POA: Diagnosis not present

## 2018-01-15 DIAGNOSIS — I509 Heart failure, unspecified: Secondary | ICD-10-CM

## 2018-01-15 LAB — URINALYSIS, COMPLETE (UACMP) WITH MICROSCOPIC
Bacteria, UA: NONE SEEN
Bilirubin Urine: NEGATIVE
Glucose, UA: NEGATIVE mg/dL
HGB URINE DIPSTICK: NEGATIVE
Ketones, ur: NEGATIVE mg/dL
LEUKOCYTES UA: NEGATIVE
Nitrite: NEGATIVE
Protein, ur: NEGATIVE mg/dL
SPECIFIC GRAVITY, URINE: 1.013 (ref 1.005–1.030)
SQUAMOUS EPITHELIAL / LPF: NONE SEEN
pH: 5 (ref 5.0–8.0)

## 2018-01-15 LAB — BASIC METABOLIC PANEL
ANION GAP: 11 (ref 5–15)
BUN: 14 mg/dL (ref 6–20)
CHLORIDE: 101 mmol/L (ref 101–111)
CO2: 23 mmol/L (ref 22–32)
Calcium: 8.3 mg/dL — ABNORMAL LOW (ref 8.9–10.3)
Creatinine, Ser: 1.13 mg/dL — ABNORMAL HIGH (ref 0.44–1.00)
GFR, EST AFRICAN AMERICAN: 51 mL/min — AB (ref >60)
GFR, EST NON AFRICAN AMERICAN: 44 mL/min — AB (ref >60)
Glucose, Bld: 104 mg/dL — ABNORMAL HIGH (ref 65–99)
POTASSIUM: 3.1 mmol/L — AB (ref 3.5–5.1)
SODIUM: 135 mmol/L (ref 135–145)

## 2018-01-15 LAB — CBC
HEMATOCRIT: 28.2 % — AB (ref 35.0–47.0)
Hemoglobin: 9.1 g/dL — ABNORMAL LOW (ref 12.0–16.0)
MCH: 25.9 pg — ABNORMAL LOW (ref 26.0–34.0)
MCHC: 32.4 g/dL (ref 32.0–36.0)
MCV: 80 fL (ref 80.0–100.0)
Platelets: 553 10*3/uL — ABNORMAL HIGH (ref 150–440)
RBC: 3.52 MIL/uL — AB (ref 3.80–5.20)
RDW: 19.2 % — ABNORMAL HIGH (ref 11.5–14.5)
WBC: 9.5 10*3/uL (ref 3.6–11.0)

## 2018-01-15 LAB — HEPATIC FUNCTION PANEL
ALBUMIN: 2.4 g/dL — AB (ref 3.5–5.0)
ALK PHOS: 116 U/L (ref 38–126)
ALT: 9 U/L — ABNORMAL LOW (ref 14–54)
AST: 15 U/L (ref 15–41)
BILIRUBIN TOTAL: 0.6 mg/dL (ref 0.3–1.2)
Bilirubin, Direct: <0.1 mg/dL — ABNORMAL LOW (ref 0.1–0.5)
TOTAL PROTEIN: 7.4 g/dL (ref 6.5–8.1)

## 2018-01-15 LAB — LIPASE, BLOOD: Lipase: 28 U/L (ref 11–51)

## 2018-01-15 LAB — TROPONIN I: Troponin I: <0.03 ng/mL (ref <0.03)

## 2018-01-15 MED ORDER — POTASSIUM CHLORIDE 20 MEQ PO PACK
40.0000 meq | PACK | Freq: Once | ORAL | Status: AC
  Administered 2018-01-15: 40 meq via ORAL

## 2018-01-15 MED ORDER — POTASSIUM CHLORIDE ER 10 MEQ PO TBCR: 10.0000 meq | EXTENDED_RELEASE_TABLET | Freq: Every day | ORAL | 0 refills | Status: AC

## 2018-01-15 MED ORDER — FUROSEMIDE 20 MG PO TABS: 20.0000 mg | ORAL_TABLET | Freq: Every day | ORAL | 0 refills | Status: AC

## 2018-01-15 MED ORDER — ACETAMINOPHEN 325 MG PO TABS
650.0000 mg | ORAL_TABLET | Freq: Once | ORAL | Status: AC
  Administered 2018-01-15: 650 mg via ORAL
  Filled 2018-01-15: qty 2

## 2018-01-15 MED ORDER — POTASSIUM CHLORIDE 20 MEQ PO PACK
PACK | ORAL | Status: AC
  Filled 2018-01-15: qty 2

## 2018-01-15 MED ORDER — POTASSIUM CHLORIDE CRYS ER 20 MEQ PO TBCR: 40.0000 meq | EXTENDED_RELEASE_TABLET | Freq: Once | ORAL | Status: DC

## 2018-01-15 NOTE — ED Notes (Signed)
This RN and Vicente Males, RN attempted for IV access without success.

## 2018-01-15 NOTE — ED Notes (Signed)
Pt taken to Xray at this time.

## 2018-01-15 NOTE — ED Notes (Signed)
Pt placed on 2L O2 via [place].

## 2018-01-15 NOTE — ED Triage Notes (Signed)
Pt presents to ED via [facility] with c/o L flank pain from [facility] care facility. Per EMS pt c/o L flank pain at this time. Pt denies any burning with urination, or frequency.

## 2018-01-15 NOTE — Discharge Instructions (Addendum)
Although no certain cause was found, your exam and evaluation are overall reassuring in the emergency department today.  As we discussed, I am most suspicious this may be related to body wall or musculoskeletal pain.  You may take Tylenol as needed for pain.  You also have evidence of increased fluid, congestive heart failure exacerbation, and I am increasing her Lasix dose to 20 mg daily until Friday when you can see your cardiologist.

## 2018-01-15 NOTE — ED Notes (Signed)
IV attempted unsuccessfully x2 by this RN.

## 2018-01-15 NOTE — ED Notes (Signed)
Pt dressed and assisted into the wheelchair by this RN and Cassie, Therapist, sports. Pt tolerated well. Pt taken to lobby via wheelchair by this RN. Pt's daughter took patient back to [facility] via POV. Pt noted to be in stable condition at time of departure.

## 2018-01-15 NOTE — ED Provider Notes (Signed)
[facility] Emergency Department Provider Note ____________________________________________   I have reviewed the triage vital signs and the triage nursing note.  HISTORY  Chief Complaint Flank Pain   Historian Patient, and daughter-in-law  HPI Mindy Cortez is a 83 y.o. female presents from nursing home with complaint of left lateral chest wall pain, really at the junction of the abdomen and thorax.  Worse with movement.  Noticed it this morning.  Denies previous history of this type of pain.  She has been doing physical therapy but she has had no known overuse or injury.  Denies urinary symptoms.  Denies sensation of abdominal pain.  Denies bowel changes.  Pain is moderate.     Past Medical History:  Diagnosis Date  . AICD (automatic cardioverter/defibrillator) present    non-functioning due to no active battery in place  . Atrial fibrillation ([place])   . CHF (congestive heart failure) ([place])   . Depression   . 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 HTN (hypertension)   . Hypertension   . Neuropathy   . Nocturia   . Non-Hodgkin lymphoma ([place])    Bone Marrow Transplant with chemo + Rad tx's.  . Tachycardia   . Urge incontinence   . Urinary incontinence     Patient Active Problem List   Diagnosis Date Noted  . Acute on chronic systolic CHF (congestive heart failure) ([place]) 12/14/2017  . AF (paroxysmal atrial fibrillation) ([place]) 12/14/2017  . MRSA bacteremia 10/16/2017  . Septic shock ([place])   . Right lower quadrant abdominal pain   . Pressure injury of skin 10/15/2017  . Sepsis ([place]) 10/10/2017  . Atherosclerosis of native arteries of the extremities with ulceration ([place]) 12/11/2016  . Acquired absence of right hip 04/08/2015  . Bilateral cataracts 04/08/2015  . CCF (congestive cardiac failure) ([place]) 04/08/2015  . Clinical depression 04/08/2015  . Glaucoma 04/08/2015  . HTN (hypertension)  04/08/2015  . Neuropathy 04/08/2015  . Insomnia, persistent 03/16/2015  . Mixed Alzheimer's and vascular dementia 03/16/2015  . Atrial fibrillation with rapid ventricular response ([place]) 02/08/2015  . B12 deficiency 08/02/2014  . Leg pain, right 06/09/2014  . Leg numbness 06/09/2014  . Endomyocardial disease ([place]) 11/13/2006  . Cardiac failure left ([place]) 11/13/2006  . Lymphoma, non-Hodgkin's ([place]) 11/13/2006  . Infection and inflammatory reaction due to internal prosthetic device, implant, and graft 11/13/2006  . Infection of breast implant ([place]) 11/13/2006  . Lymphoma of extranodal and solid organ sites ([place]) 11/13/2006  . Cardiomyopathy ([place]) 11/13/2006    Past Surgical History:  Procedure Laterality Date  . BONE MARROW TRANSPLANT    . COMPLICATED TOTAL HIP PROSTHESIS AND METHYLMETHACRALATE REMOVAL W/O SPACER INSERTION    . PERIPHERAL VASCULAR CATHETERIZATION Right 06/18/2016   Procedure: Lower Extremity Angiography;  Surgeon: Algernon Huxley, MD;  Location: [place] [facility];  Service: Cardiovascular;  Laterality: Right;  . PERIPHERAL VASCULAR CATHETERIZATION  06/18/2016   Procedure: Lower Extremity Intervention;  Surgeon: Algernon Huxley, MD;  Location: [place] [facility];  Service: Cardiovascular;;  . TOTAL HIP ARTHROPLASTY  5427,0623    Prior to Admission medications   Medication Sig Start Date End Date Taking? Authorizing Provider  acetaminophen (TYLENOL) 325 MG tablet Take 650 mg by mouth every 6 (six) hours as needed.   Yes [provider]  acidophilus (RISAQUAD) CAPS capsule Take 1 capsule by mouth daily.   Yes [provider]  apixaban (ELIQUIS) 2.5 MG  TABS tablet Take 2.5 mg by mouth 2 (two) times daily.   Yes [provider]  aspirin 81 MG chewable tablet Chew 81 mg by mouth daily.   Yes [provider]  cholecalciferol (VITAMIN D) 1000 units tablet Take 1,000 Units by mouth daily.   Yes [provider]  donepezil (ARICEPT) 10 MG  tablet Take 10 mg by mouth at bedtime.   Yes [provider]  DULoxetine (CYMBALTA) 30 MG capsule Take 3 capsules (90 mg total) by mouth daily. 10/18/17  Yes Vaughan Basta, MD  ferrous sulfate 325 (65 FE) MG tablet Take 325 mg by mouth daily with breakfast.   Yes [provider]  furosemide (LASIX) 20 MG tablet Take  tablet (10MG ) by mouth daily and 1 tablet (20MG ) as needed for fluid retention   Yes [provider]  magnesium oxide (MAG-OX) 400 MG tablet Take 400 mg by mouth daily.   Yes [provider]  Melatonin 3 MG TBDP Take 3 mg by mouth at bedtime.   Yes [provider]  metoprolol tartrate (LOPRESSOR) 25 MG tablet Take 0.5 tablets (12.5 mg total) by mouth 2 (two) times daily. Patient taking differently: Take 25 mg by mouth 2 (two) times daily.  10/17/17  Yes Vaughan Basta, MD  Multiple Vitamin (MULTIVITAMIN WITH MINERALS) TABS tablet Take 1 tablet by mouth daily.   Yes [provider]  vitamin B-12 (CYANOCOBALAMIN) 1000 MCG tablet Take 1,000 mcg by mouth daily.   Yes [provider]  atorvastatin (LIPITOR) 20 MG tablet Take 1 tablet (20 mg total) by mouth daily. Patient not taking: Reported on 01/15/2018 12/16/17   Bettey Costa, MD    Allergies  Allergen Reactions  . Penicillin V Potassium Hives    Has patient had a PCN reaction causing immediate rash, facial/tongue/throat swelling, SOB or lightheadedness with hypotension: Unknown Has patient had a PCN reaction causing severe rash involving mucus membranes or skin necrosis: Unknown Has patient had a PCN reaction that required hospitalization: Unknown Has patient had a PCN reaction occurring within the last 10 years: Unknown If all of the above answers are "NO", then may proceed with Cephalosporin use.   Marland Kitchen Prochlorperazine Nausea And Vomiting    DIZZINESS, DRUNK FEELING. DIZZINESS, DRUNK FEELING.  . Ramipril Other (See Comments)    SEVERE HEADACHE SEVERE  HEADACHE  . Rifampin     Severe stomach irriatates cramping and nausea  . Sulfa Antibiotics     TOLD AS A CHILD, MADE PT NERVOUS, EMOTIONAL, TOLD NOT TO TAKE.  . Cephalexin Rash    RASH, ITCHING. RASH, ITCHING.    Family History  Problem Relation Age of Onset  . Osteosarcoma Father   . Heart disease Father   . Heart disease Mother   . Breast cancer Mother     Social History Social History   Tobacco Use  . Smoking status: Never Smoker  . Smokeless tobacco: Never Used  Substance Use Topics  . Alcohol use: Yes    Alcohol/week: 0.0 oz    Comment: rarely  . Drug use: No    Review of Systems  Constitutional: Negative for fever. Eyes: Negative for visual changes. ENT: Negative for sore throat. Cardiovascular: Negative for chest pain. Respiratory: Negative for shortness of breath. Gastrointestinal: Negative for abdominal pain, vomiting and diarrhea. Genitourinary: Negative for dysuria. Musculoskeletal: Negative for back pain. Skin: Negative for rash. Neurological: Negative for headache.  ____________________________________________   PHYSICAL EXAM:  VITAL SIGNS: ED Triage Vitals  Enc Vitals  Group     BP 01/15/18 0722 117/70     Pulse Rate 01/15/18 0722 82     Resp 01/15/18 0722 16     Temp 01/15/18 0722 97.7 F (36.5 C)     Temp Source 01/15/18 0722 Oral     SpO2 01/15/18 0722 (!) 89 %     Weight 01/15/18 0719 120 lb (54.4 kg)     Height 01/15/18 0719 5\' 5"  (1.651 m)     Head Circumference --      Peak Flow --      Pain Score 01/15/18 0719 6     Pain Loc --      Pain Edu? --      Excl. in [place]? --      Constitutional: Alert and oriented. Well appearing and in no distress. HEENT   Head: Normocephalic and atraumatic.      Eyes: Conjunctivae are normal. Pupils equal and round.       Ears:         Nose: No congestion/rhinnorhea.   Mouth/Throat: Mucous membranes are moist.   Neck: No stridor. Cardiovascular/Chest: Normal rate, regular rhythm.   No murmurs, rubs, or gallops.  Some discomfort at the lower rib margin laterally. Respiratory: Normal respiratory effort without tachypnea nor retractions. Breath sounds are clear and equal bilaterally. No wheezes/rales/rhonchi. Gastrointestinal: Soft. No distention, no guarding, no rebound. Nontender to superficial deep palpation in 4 quadrants. Genitourinary/rectal:Deferred Musculoskeletal: Nontender with normal range of motion in all extremities. No joint effusions.  No lower extremity tenderness.  No edema. Neurologic:  Normal speech and language. No gross or focal neurologic deficits are appreciated. Skin:  Skin is warm, dry and intact. No rash noted. Psychiatric: Mood and affect are normal. Speech and behavior are normal. Patient exhibits appropriate insight and judgment.   ____________________________________________  LABS (pertinent positives/negatives) I, Lisa Roca, MD the attending physician have reviewed the labs noted below.  Labs Reviewed  URINALYSIS, COMPLETE (UACMP) WITH MICROSCOPIC - Abnormal; Notable for the following components:      Result Value   Color, Urine YELLOW (*)    APPearance CLEAR (*)    All other components within normal limits  BASIC METABOLIC PANEL - Abnormal; Notable for the following components:   Potassium 3.1 (*)    Glucose, Bld 104 (*)    Creatinine, Ser 1.13 (*)    Calcium 8.3 (*)    GFR calc non Af Amer 44 (*)    GFR calc Af Amer 51 (*)    All other components within normal limits  CBC - Abnormal; Notable for the following components:   RBC 3.52 (*)    Hemoglobin 9.1 (*)    HCT 28.2 (*)    MCH 25.9 (*)    RDW 19.2 (*)    Platelets 553 (*)    All other components within normal limits  HEPATIC FUNCTION PANEL - Abnormal; Notable for the following components:   Albumin 2.4 (*)    ALT 9 (*)    Bilirubin, Direct <0.1 (*)    All other components within normal limits  LIPASE, BLOOD  TROPONIN I     ____________________________________________    EKG I, Lisa Roca, MD, the attending physician have personally viewed and interpreted all ECGs.  84 bpm.  normal sinus rhythm.  Nonspecific intraventricular conduction delay.  Normal axis.  Nonspecific ST and T wave ____________________________________________  RADIOLOGY   DG abdomen with chest reviewed by me and interpreted by me: no pneumonia Radiologist interpretation:  IMPRESSION: No calcified urinary tract stones are observed. No acute intra-abdominal abnormality.  Chronic bronchitic changes with superimposed CHF with moderate interstitial edema. __________________________________________  PROCEDURES  Procedure(s) performed: None  Procedures  Critical Care performed: None   ____________________________________________  ED COURSE / ASSESSMENT AND PLAN  Pertinent labs & imaging results that were available during my care of the patient were reviewed by me and considered in my medical decision making (see chart for details).   Location and description of pain, unclear by just the history and physical exam.  The location does not make it clear whether not this is thoracic or abdomen, in some ways it seems more so body wall.  We will try dose of Tylenol here as she has not taken anything.  She is not really complaining of any additional cardiopulmonary symptoms nor any additional abdominal symptoms.  The pain seems somewhat worse with movement.  Laboratory studies are overall reassuring.  She is a chronic anemia, nothing new or decreased from most recent around 9.  No elevated white blood cell count, and no other concerning symptoms on exam for infections.  Urinalysis shows no sign of urinary tract infection or blood for kidney stone.  Lipase is normal.  I discussed the reassuring exam and evaluation with patient and her daughter-in-law, at this point I am most suspicious of musculoskeletal source of  discomfort.  I will again recommend Tylenol.  And I am going to go ahead and increase her Lasix given clinical volume overload I do not think this is related to her discomfort, as well as potassium supplementation for the next several days with the increased Lasix especially in the setting of hypokalemia today.   CONSULTATIONS:   None   Patient / Family / Caregiver informed of clinical course, medical decision-making process, and agree with plan.   I discussed return precautions, follow-up instructions, and discharge instructions with patient and/or family.  Discharge Instructions : Although no certain cause was found, your exam and evaluation are overall reassuring in the emergency department today.  As we discussed, I am most suspicious this may be related to body wall or musculoskeletal pain.  You may take Tylenol as needed for pain.  You also have evidence of increased fluid, congestive heart failure exacerbation, and I am increasing her Lasix dose to 20 mg daily until Friday when you can see your cardiologist.    ___________________________________________   FINAL CLINICAL IMPRESSION(S) / ED DIAGNOSES   Final diagnoses:  Left flank pain  Acute on chronic congestive heart failure, unspecified heart failure type ([place])  Hypokalemia      ___________________________________________        Note: This dictation was prepared with Dragon dictation. Any transcriptional errors that result from this process are unintentional    Lisa Roca, MD 01/15/18 1045

## 2018-01-16 ENCOUNTER — Ambulatory Visit (INDEPENDENT_AMBULATORY_CARE_PROVIDER_SITE_OTHER): Payer: Medicare Other | Admitting: Vascular Surgery

## 2018-01-21 ENCOUNTER — Ambulatory Visit (INDEPENDENT_AMBULATORY_CARE_PROVIDER_SITE_OTHER): Payer: Medicare Other | Admitting: Vascular Surgery

## 2018-01-22 ENCOUNTER — Encounter (INDEPENDENT_AMBULATORY_CARE_PROVIDER_SITE_OTHER): Payer: Medicare Other

## 2018-01-22 ENCOUNTER — Encounter (INDEPENDENT_AMBULATORY_CARE_PROVIDER_SITE_OTHER): Payer: Self-pay

## 2018-01-22 ENCOUNTER — Encounter (INDEPENDENT_AMBULATORY_CARE_PROVIDER_SITE_OTHER): Payer: Self-pay | Admitting: Vascular Surgery

## 2018-01-22 ENCOUNTER — Ambulatory Visit (INDEPENDENT_AMBULATORY_CARE_PROVIDER_SITE_OTHER): Payer: Medicare Other | Admitting: Vascular Surgery

## 2018-01-22 ENCOUNTER — Ambulatory Visit (INDEPENDENT_AMBULATORY_CARE_PROVIDER_SITE_OTHER): Payer: Medicare Other

## 2018-01-22 VITALS — BP 93/65 | HR 99 | Resp 14 | Ht 65.0 in | Wt 130.0 lb

## 2018-01-22 DIAGNOSIS — I7025 Atherosclerosis of native arteries of other extremities with ulceration: Secondary | ICD-10-CM | POA: Diagnosis not present

## 2018-01-22 DIAGNOSIS — R600 Localized edema: Secondary | ICD-10-CM | POA: Diagnosis not present

## 2018-01-22 DIAGNOSIS — L97511 Non-pressure chronic ulcer of other part of right foot limited to breakdown of skin: Secondary | ICD-10-CM | POA: Diagnosis not present

## 2018-01-22 DIAGNOSIS — I739 Peripheral vascular disease, unspecified: Secondary | ICD-10-CM | POA: Diagnosis not present

## 2018-01-22 NOTE — Progress Notes (Signed)
Subjective:    Patient ID: Mindy Cortez, female    DOB: October 16, 1935, 83 y.o.   MRN: [id number] Chief Complaint  Patient presents with  . Follow-up    ref [facility] for ABI   Presents at the request of Dr. Elvina Mattes for evaluation of peripheral artery disease and right toe ulceration.  The patient has a past medical history of peripheral artery disease requiring endovascular intervention in the past.  The patient does not have a right hip and therefore is unable to ambulate.  Patient seen with daughter.  Patient endorses a history of developing a wound to the second toe of the right foot.  Her daughter states that this wound heals but intermittently worsens then heals again.  The patient recently sustained trauma to her big right toenail.  The patient does not remember how she did this and subsequently removed the toenail herself.  This area has not healed.  The patient is endorsing progressively worsening left lower extremity pain.  Patient states the pain is constant.  Pain is present with and without activity.  The daughter notes that the left lower extremity pain has progressed to the point that it is becoming harder for the patient to transfer out of her wheelchair, and bed or to the commode.  The patient is also experiencing progressively worsening swelling to the right lower extremity.  The daughter states that her cardiologist are trying to find the right "balance" with her diuretic at this time.  The patient denies any fever, nausea or vomiting.  The patient underwent a bilateral ABI which was notable for "no evidence of significant lower extremity arterial disease" however there is monophasic waveforms to the bilateral tibials and practically flat great toe waveforms bilaterally.  Review of Systems  Constitutional: Negative.   HENT: Negative.   Eyes: Negative.   Respiratory: Negative.   Cardiovascular:       Left lower extremity pain Right toe ulceration Right lower extremity edema    Gastrointestinal: Negative.   Endocrine: Negative.   Genitourinary: Negative.   Musculoskeletal: Negative.   Skin: Negative.   Allergic/Immunologic: Negative.   Neurological: Negative.   Hematological: Negative.   Psychiatric/Behavioral: Negative.       Objective:   Physical Exam  Constitutional: She is oriented to person, place, and time. She appears well-developed and well-nourished. No distress.  In wheelchair  HENT:  Head: Normocephalic and atraumatic.  Eyes: Pupils are equal, round, and reactive to light. Conjunctivae are normal.  Neck: Normal range of motion.  Cardiovascular: Normal rate, normal heart sounds and intact distal pulses.  Pulses:      Radial pulses are 2+ on the right side, and 2+ on the left side.  Unable to palpate pedal pulses bilaterally however the foot is warm to approximately mid foot then becomes cooler bilaterally.  Pulmonary/Chest: Effort normal and breath sounds normal.  Musculoskeletal: Normal range of motion. She exhibits edema (Mild to moderate 1+ pitting edema noted to the right lower extremity).  Neurological: She is alert and oriented to person, place, and time.  Skin: She is not diaphoretic.  Right foot: Missing nail in the big toenail bed.  Blister noted to the lateral aspect of the second toe.  There are no open ulcerations noted to the right foot.  There is no cellulitis noted to the right foot.  There are no gangrenous changes to the right foot.  Psychiatric: She has a normal mood and affect. Her behavior is normal. Judgment and thought content normal.  Vitals reviewed.  BP 93/65 (BP Location: Right Arm)   Pulse 99   Resp 14   Ht 5\' 5"  (1.651 m)   Wt 130 lb (59 kg)   BMI 21.63 kg/m   Past Medical History:  Diagnosis Date  . AICD (automatic cardioverter/defibrillator) present    non-functioning due to no active battery in place  . Atrial fibrillation ([place])   . CHF (congestive heart failure) ([place])   . Depression   . 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 HTN (hypertension)   . Hypertension   . Neuropathy   . Nocturia   . Non-Hodgkin lymphoma ([place])    Bone Marrow Transplant with chemo + Rad tx's.  . Tachycardia   . Urge incontinence   . Urinary incontinence    Social History   Socioeconomic History  . Marital status: Single    Spouse name: Not on file  . Number of children: Not on file  . Years of education: Not on file  . Highest education level: Not on file  Occupational History  . Not on file  Social Needs  . Financial resource strain: Not hard at all  . Food insecurity:    Worry: Never true    Inability: Never true  . Transportation needs:    Medical: No    Non-medical: No  Tobacco Use  . Smoking status: Never Smoker  . Smokeless tobacco: Never Used  Substance and Sexual Activity  . Alcohol use: Yes    Alcohol/week: 0.0 oz    Comment: rarely  . Drug use: No  . Sexual activity: Never  Lifestyle  . Physical activity:    Days per week: 0 days    Minutes per session: 0 min  . Stress: Not at all  Relationships  . Social connections:    Talks on phone: Twice a week    Gets together: Once a week    Attends religious service: Never    Active member of club or organization: No    Attends meetings of clubs or organizations: Never    Relationship status: Widowed  . Intimate partner violence:    Fear of current or ex partner: No    Emotionally abused: No    Physically abused: No    Forced sexual activity: No  Other Topics Concern  . Not on file  Social History Narrative   Lives at [facility]   Past Surgical History:  Procedure Laterality Date  . BONE MARROW TRANSPLANT    . COMPLICATED TOTAL HIP PROSTHESIS AND METHYLMETHACRALATE REMOVAL W/O SPACER INSERTION    . PERIPHERAL VASCULAR CATHETERIZATION Right 06/18/2016   Procedure: Lower Extremity Angiography;  Surgeon: Algernon Huxley, MD;  Location: [facility];  Service:  Cardiovascular;  Laterality: Right;  . PERIPHERAL VASCULAR CATHETERIZATION  06/18/2016   Procedure: Lower Extremity Intervention;  Surgeon: Algernon Huxley, MD;  Location: [facility];  Service: Cardiovascular;;  . TOTAL HIP ARTHROPLASTY  4196,[id number]   Family History  Problem Relation Age of Onset  . Osteosarcoma Father   . Heart disease Father   . Heart disease Mother   . Breast cancer Mother    Allergies  Allergen Reactions  . Penicillin V Potassium Hives    Has patient had a PCN reaction causing immediate rash, facial/tongue/throat swelling, SOB or lightheadedness with hypotension: Unknown Has patient had a PCN reaction causing severe rash involving mucus membranes or skin necrosis:  Unknown Has patient had a PCN reaction that required hospitalization: Unknown Has patient had a PCN reaction occurring within the last 10 years: Unknown If all of the above answers are "NO", then may proceed with Cephalosporin use.   Marland Kitchen Prochlorperazine Nausea And Vomiting    DIZZINESS, DRUNK FEELING. DIZZINESS, DRUNK FEELING.  . Ramipril Other (See Comments)    SEVERE HEADACHE SEVERE HEADACHE  . Rifampin     Severe stomach irriatates cramping and nausea  . Sulfa Antibiotics     TOLD AS A CHILD, MADE PT NERVOUS, EMOTIONAL, TOLD NOT TO TAKE.  . Cephalexin Rash    RASH, ITCHING. RASH, ITCHING.      Assessment & Plan:  Presents at the request of Dr. Elvina Mattes for evaluation of peripheral artery disease and right toe ulceration.  The patient has a past medical history of peripheral artery disease requiring endovascular intervention in the past.  The patient does not have a right hip and therefore is unable to ambulate.  Patient seen with daughter.  Patient endorses a history of developing a wound to the second toe of the right foot.  Her daughter states that this wound heals but intermittently worsens then heals again.  The patient recently sustained trauma to her big right toenail.  The patient does  not remember how she did this and subsequently removed the toenail herself.  This area has not healed.  The patient is endorsing progressively worsening left lower extremity pain.  Patient states the pain is constant.  Pain is present with and without activity.  The daughter notes that the left lower extremity pain has progressed to the point that it is becoming harder for the patient to transfer out of her wheelchair, and bed or to the commode.  The patient is also experiencing progressively worsening swelling to the right lower extremity.  The daughter states that her cardiologist are trying to find the right "balance" with her diuretic at this time.  The patient denies any fever, nausea or vomiting.  The patient underwent a bilateral ABI which was notable for "no evidence of significant lower extremity arterial disease" however there is monophasic waveforms to the bilateral tibials and practically flat great toe waveforms bilaterally.  1. PAD (peripheral artery disease) ([place]) - Stable Patient has a past medical history of peripheral artery disease requiring endovascular intervention in the past Patient with multiple risk factors for peripheral artery disease Patient presents today with slow healing ulcerations to the right foot and progressively worsening left lower extremity pain Hard to palpate pedal pulses on physical exam bilaterally ABI with bilateral monophasic tibials and practically flat great toe waveforms. Recommend a right lower extremity angiogram followed by left lower extremity angiogram and attempt to assess the patient's anatomy and degree of contributing peripheral artery disease.  If appropriate at that time an attempt to revascularize the leg can be made. Procedure, risks and benefits explained to the patient and her daughter Questions answered The patient wishes to proceed  2. Lower extremity edema - Stable Once the patient has better blood flow to her right lower extremity I  would recommend undergoing three layer zinc oxide unna wraps to help control the edema while the patient's cardiologist is able to find a healthy balance for her diuretics. The patient will try to elevate her legs that she can even though she is missing her right hip.  3. Skin ulcer of right great toe, limited to breakdown of skin ([facility]) - Stable As above  Current Outpatient Medications  on File Prior to Visit  Medication Sig Dispense Refill  . acetaminophen (TYLENOL) 325 MG tablet Take 650 mg by mouth every 6 (six) hours as needed.    Marland Kitchen apixaban (ELIQUIS) 2.5 MG TABS tablet Take 2.5 mg by mouth 2 (two) times daily.    Marland Kitchen aspirin 81 MG chewable tablet Chew 81 mg by mouth daily.    Marland Kitchen atorvastatin (LIPITOR) 20 MG tablet Take 1 tablet (20 mg total) by mouth daily. 30 tablet 0  . cholecalciferol (VITAMIN D) 1000 units tablet Take 1,000 Units by mouth daily.    Marland Kitchen donepezil (ARICEPT) 10 MG tablet Take 10 mg by mouth at bedtime.    . DULoxetine (CYMBALTA) 30 MG capsule Take 3 capsules (90 mg total) by mouth daily. 30 capsule 0  . furosemide (LASIX) 20 MG tablet Take 1 tablet (20 mg total) by mouth daily. Take  tablet (10MG ) by mouth daily and 1 tablet (20MG ) as needed for fluid retention 3 tablet 0  . magnesium oxide (MAG-OX) 400 MG tablet Take 400 mg by mouth daily.    . Melatonin 3 MG TBDP Take 3 mg by mouth at bedtime.    . metoprolol tartrate (LOPRESSOR) 25 MG tablet Take 0.5 tablets (12.5 mg total) by mouth 2 (two) times daily. (Patient taking differently: Take 25 mg by mouth 2 (two) times daily. ) 60 tablet 0  . Multiple Vitamin (MULTIVITAMIN WITH MINERALS) TABS tablet Take 1 tablet by mouth daily.    . sennosides-docusate sodium (SENOKOT-S) 8.6-50 MG tablet Take 2 tablets by mouth 2 (two) times daily.    . vitamin B-12 (CYANOCOBALAMIN) 1000 MCG tablet Take 1,000 mcg by mouth daily.    Marland Kitchen acidophilus (RISAQUAD) CAPS capsule Take 1 capsule by mouth daily.    . ferrous sulfate 325 (65 FE) MG tablet  Take 325 mg by mouth daily with breakfast.    . potassium chloride (K-DUR) 10 MEQ tablet Take 1 tablet (10 mEq total) by mouth daily. (Patient not taking: Reported on 01/22/2018) 3 tablet 0   No current facility-administered medications on file prior to visit.    There are no Patient Instructions on file for this visit. No follow-ups on file.  Fayne Mcguffee A Kariya Lavergne, PA-C

## 2018-01-24 ENCOUNTER — Encounter (INDEPENDENT_AMBULATORY_CARE_PROVIDER_SITE_OTHER): Payer: Self-pay

## 2018-01-27 ENCOUNTER — Other Ambulatory Visit (INDEPENDENT_AMBULATORY_CARE_PROVIDER_SITE_OTHER): Payer: Self-pay | Admitting: Vascular Surgery

## 2018-01-30 ENCOUNTER — Other Ambulatory Visit: Payer: Self-pay

## 2018-01-30 ENCOUNTER — Inpatient Hospital Stay: Payer: Medicare Other

## 2018-01-30 ENCOUNTER — Inpatient Hospital Stay
Admission: EM | Admit: 2018-01-30 | Discharge: 2018-02-19 | DRG: 291 | Disposition: E | Payer: Medicare Other | Attending: Internal Medicine | Admitting: Internal Medicine

## 2018-01-30 ENCOUNTER — Emergency Department: Payer: Medicare Other

## 2018-01-30 DIAGNOSIS — Z880 Allergy status to penicillin: Secondary | ICD-10-CM

## 2018-01-30 DIAGNOSIS — I482 Chronic atrial fibrillation: Secondary | ICD-10-CM | POA: Diagnosis present

## 2018-01-30 DIAGNOSIS — Z881 Allergy status to other antibiotic agents status: Secondary | ICD-10-CM

## 2018-01-30 DIAGNOSIS — I428 Other cardiomyopathies: Secondary | ICD-10-CM | POA: Diagnosis present

## 2018-01-30 DIAGNOSIS — F329 Major depressive disorder, single episode, unspecified: Secondary | ICD-10-CM | POA: Diagnosis present

## 2018-01-30 DIAGNOSIS — Z8614 Personal history of Methicillin resistant Staphylococcus aureus infection: Secondary | ICD-10-CM | POA: Diagnosis not present

## 2018-01-30 DIAGNOSIS — J810 Acute pulmonary edema: Secondary | ICD-10-CM | POA: Diagnosis present

## 2018-01-30 DIAGNOSIS — Z803 Family history of malignant neoplasm of breast: Secondary | ICD-10-CM

## 2018-01-30 DIAGNOSIS — Z7982 Long term (current) use of aspirin: Secondary | ICD-10-CM

## 2018-01-30 DIAGNOSIS — Z515 Encounter for palliative care: Secondary | ICD-10-CM | POA: Diagnosis not present

## 2018-01-30 DIAGNOSIS — Z9889 Other specified postprocedural states: Secondary | ICD-10-CM

## 2018-01-30 DIAGNOSIS — G309 Alzheimer's disease, unspecified: Secondary | ICD-10-CM | POA: Diagnosis present

## 2018-01-30 DIAGNOSIS — I5023 Acute on chronic systolic (congestive) heart failure: Secondary | ICD-10-CM | POA: Diagnosis present

## 2018-01-30 DIAGNOSIS — I739 Peripheral vascular disease, unspecified: Secondary | ICD-10-CM | POA: Diagnosis present

## 2018-01-30 DIAGNOSIS — I509 Heart failure, unspecified: Secondary | ICD-10-CM

## 2018-01-30 DIAGNOSIS — Z923 Personal history of irradiation: Secondary | ICD-10-CM

## 2018-01-30 DIAGNOSIS — Z9481 Bone marrow transplant status: Secondary | ICD-10-CM

## 2018-01-30 DIAGNOSIS — N179 Acute kidney failure, unspecified: Secondary | ICD-10-CM | POA: Diagnosis not present

## 2018-01-30 DIAGNOSIS — Z6821 Body mass index (BMI) 21.0-21.9, adult: Secondary | ICD-10-CM

## 2018-01-30 DIAGNOSIS — F0280 Dementia in other diseases classified elsewhere without behavioral disturbance: Secondary | ICD-10-CM | POA: Diagnosis present

## 2018-01-30 DIAGNOSIS — J90 Pleural effusion, not elsewhere classified: Secondary | ICD-10-CM

## 2018-01-30 DIAGNOSIS — L304 Erythema intertrigo: Secondary | ICD-10-CM | POA: Diagnosis present

## 2018-01-30 DIAGNOSIS — Z66 Do not resuscitate: Secondary | ICD-10-CM | POA: Diagnosis present

## 2018-01-30 DIAGNOSIS — Z7901 Long term (current) use of anticoagulants: Secondary | ICD-10-CM

## 2018-01-30 DIAGNOSIS — R0602 Shortness of breath: Secondary | ICD-10-CM

## 2018-01-30 DIAGNOSIS — L97519 Non-pressure chronic ulcer of other part of right foot with unspecified severity: Secondary | ICD-10-CM | POA: Diagnosis present

## 2018-01-30 DIAGNOSIS — E440 Moderate protein-calorie malnutrition: Secondary | ICD-10-CM | POA: Diagnosis present

## 2018-01-30 DIAGNOSIS — I110 Hypertensive heart disease with heart failure: Secondary | ICD-10-CM | POA: Diagnosis present

## 2018-01-30 DIAGNOSIS — Z8572 Personal history of non-Hodgkin lymphomas: Secondary | ICD-10-CM | POA: Diagnosis not present

## 2018-01-30 DIAGNOSIS — Z9221 Personal history of antineoplastic chemotherapy: Secondary | ICD-10-CM | POA: Diagnosis not present

## 2018-01-30 DIAGNOSIS — I872 Venous insufficiency (chronic) (peripheral): Secondary | ICD-10-CM | POA: Diagnosis present

## 2018-01-30 DIAGNOSIS — F0150 Vascular dementia without behavioral disturbance: Secondary | ICD-10-CM | POA: Diagnosis present

## 2018-01-30 DIAGNOSIS — I340 Nonrheumatic mitral (valve) insufficiency: Secondary | ICD-10-CM | POA: Diagnosis not present

## 2018-01-30 DIAGNOSIS — I361 Nonrheumatic tricuspid (valve) insufficiency: Secondary | ICD-10-CM | POA: Diagnosis not present

## 2018-01-30 DIAGNOSIS — R0600 Dyspnea, unspecified: Secondary | ICD-10-CM

## 2018-01-30 DIAGNOSIS — Z8249 Family history of ischemic heart disease and other diseases of the circulatory system: Secondary | ICD-10-CM

## 2018-01-30 DIAGNOSIS — J9601 Acute respiratory failure with hypoxia: Secondary | ICD-10-CM | POA: Diagnosis present

## 2018-01-30 DIAGNOSIS — E538 Deficiency of other specified B group vitamins: Secondary | ICD-10-CM | POA: Diagnosis present

## 2018-01-30 DIAGNOSIS — Z79899 Other long term (current) drug therapy: Secondary | ICD-10-CM

## 2018-01-30 DIAGNOSIS — Z888 Allergy status to other drugs, medicaments and biological substances status: Secondary | ICD-10-CM

## 2018-01-30 DIAGNOSIS — I998 Other disorder of circulatory system: Secondary | ICD-10-CM

## 2018-01-30 LAB — CBC WITH DIFFERENTIAL/PLATELET
Basophils Absolute: 0.1 10*3/uL (ref 0–0.1)
Basophils Relative: 1 %
EOS ABS: 0.1 10*3/uL (ref 0–0.7)
EOS PCT: 1 %
HCT: 33.2 % — ABNORMAL LOW (ref 35.0–47.0)
Hemoglobin: 10.3 g/dL — ABNORMAL LOW (ref 12.0–16.0)
LYMPHS ABS: 1.3 10*3/uL (ref 1.0–3.6)
Lymphocytes Relative: 12 %
MCH: 26 pg (ref 26.0–34.0)
MCHC: 30.9 g/dL — AB (ref 32.0–36.0)
MCV: 84.2 fL (ref 80.0–100.0)
Monocytes Absolute: 1 10*3/uL — ABNORMAL HIGH (ref 0.2–0.9)
Monocytes Relative: 10 %
Neutro Abs: 7.8 10*3/uL — ABNORMAL HIGH (ref 1.4–6.5)
Neutrophils Relative %: 76 %
Platelets: 366 10*3/uL (ref 150–440)
RBC: 3.95 MIL/uL (ref 3.80–5.20)
RDW: 24.7 % — ABNORMAL HIGH (ref 11.5–14.5)
WBC: 10.3 10*3/uL (ref 3.6–11.0)

## 2018-01-30 LAB — BODY FLUID CELL COUNT WITH DIFFERENTIAL
EOS FL: 0 %
LYMPHS FL: 36 %
MONOCYTE-MACROPHAGE-SEROUS FLUID: 26 %
NEUTROPHIL FLUID: 36 %
OTHER CELLS FL: 2 %
Total Nucleated Cell Count, Fluid: 181 cu mm

## 2018-01-30 LAB — COMPREHENSIVE METABOLIC PANEL
ALT: 14 U/L (ref 14–54)
ANION GAP: 10 (ref 5–15)
AST: 28 U/L (ref 15–41)
Albumin: 2.3 g/dL — ABNORMAL LOW (ref 3.5–5.0)
Alkaline Phosphatase: 107 U/L (ref 38–126)
BUN: 16 mg/dL (ref 6–20)
CO2: 22 mmol/L (ref 22–32)
Calcium: 8 mg/dL — ABNORMAL LOW (ref 8.9–10.3)
Chloride: 104 mmol/L (ref 101–111)
Creatinine, Ser: 0.98 mg/dL (ref 0.44–1.00)
GFR calc non Af Amer: 53 mL/min — ABNORMAL LOW (ref >60)
Glucose, Bld: 157 mg/dL — ABNORMAL HIGH (ref 65–99)
POTASSIUM: 3.6 mmol/L (ref 3.5–5.1)
SODIUM: 136 mmol/L (ref 135–145)
Total Bilirubin: 0.8 mg/dL (ref 0.3–1.2)
Total Protein: 7.2 g/dL (ref 6.5–8.1)

## 2018-01-30 LAB — MRSA PCR SCREENING: MRSA BY PCR: NEGATIVE

## 2018-01-30 LAB — AMYLASE, PLEURAL OR PERITONEAL FLUID: Amylase, Fluid: 10 U/L

## 2018-01-30 LAB — LACTATE DEHYDROGENASE, PLEURAL OR PERITONEAL FLUID: LD, Fluid: 68 U/L — ABNORMAL HIGH (ref 3–23)

## 2018-01-30 LAB — BRAIN NATRIURETIC PEPTIDE: B Natriuretic Peptide: 3439 pg/mL — ABNORMAL HIGH (ref 0.0–100.0)

## 2018-01-30 LAB — TROPONIN I

## 2018-01-30 LAB — TSH: TSH: 2.211 u[IU]/mL (ref 0.350–4.500)

## 2018-01-30 LAB — ALBUMIN, PLEURAL OR PERITONEAL FLUID: Albumin, Fluid: <1 g/dL

## 2018-01-30 MED ORDER — RISAQUAD PO CAPS
1.0000 | ORAL_CAPSULE | Freq: Every day | ORAL | Status: DC
  Administered 2018-01-30: 1 via ORAL
  Filled 2018-01-30 (×2): qty 1

## 2018-01-30 MED ORDER — LOPERAMIDE HCL 2 MG PO CAPS
4.0000 mg | ORAL_CAPSULE | ORAL | Status: DC | PRN
  Administered 2018-01-30: 4 mg via ORAL
  Filled 2018-01-30: qty 2

## 2018-01-30 MED ORDER — DIPHENHYDRAMINE HCL 25 MG PO TABS
25.0000 mg | ORAL_TABLET | Freq: Four times a day (QID) | ORAL | Status: DC | PRN
  Filled 2018-01-30: qty 1

## 2018-01-30 MED ORDER — IOHEXOL 350 MG/ML SOLN
75.0000 mL | Freq: Once | INTRAVENOUS | Status: AC | PRN
  Administered 2018-01-30: 75 mL via INTRAVENOUS

## 2018-01-30 MED ORDER — OCUVITE-LUTEIN PO CAPS
1.0000 | ORAL_CAPSULE | Freq: Every day | ORAL | Status: DC
  Administered 2018-01-30: 1 via ORAL
  Filled 2018-01-30 (×2): qty 1

## 2018-01-30 MED ORDER — FUROSEMIDE 10 MG/ML IJ SOLN
40.0000 mg | Freq: Two times a day (BID) | INTRAMUSCULAR | Status: DC
  Administered 2018-01-30 – 2018-01-31 (×3): 40 mg via INTRAVENOUS
  Filled 2018-01-30 (×3): qty 4

## 2018-01-30 MED ORDER — APIXABAN 2.5 MG PO TABS
2.5000 mg | ORAL_TABLET | Freq: Two times a day (BID) | ORAL | Status: DC
  Administered 2018-01-30 (×2): 2.5 mg via ORAL
  Filled 2018-01-30 (×2): qty 1

## 2018-01-30 MED ORDER — FUROSEMIDE 10 MG/ML IJ SOLN
20.0000 mg | Freq: Once | INTRAMUSCULAR | Status: AC
  Administered 2018-01-30: 20 mg via INTRAVENOUS

## 2018-01-30 MED ORDER — POTASSIUM CHLORIDE ER 10 MEQ PO TBCR
10.0000 meq | EXTENDED_RELEASE_TABLET | Freq: Every day | ORAL | Status: DC
  Administered 2018-01-30: 10 meq via ORAL
  Filled 2018-01-30 (×3): qty 1

## 2018-01-30 MED ORDER — SODIUM CHLORIDE 0.9% FLUSH
3.0000 mL | Freq: Two times a day (BID) | INTRAVENOUS | Status: DC
  Administered 2018-01-30 (×2): 3 mL via INTRAVENOUS

## 2018-01-30 MED ORDER — ONDANSETRON HCL 4 MG/2ML IJ SOLN: 4.0000 mg | Freq: Four times a day (QID) | INTRAMUSCULAR | Status: DC | PRN

## 2018-01-30 MED ORDER — ACETAMINOPHEN 325 MG PO TABS: 650.0000 mg | ORAL_TABLET | ORAL | Status: DC | PRN

## 2018-01-30 MED ORDER — METOPROLOL TARTRATE 25 MG PO TABS
12.5000 mg | ORAL_TABLET | Freq: Two times a day (BID) | ORAL | Status: DC
  Administered 2018-01-30 (×2): 12.5 mg via ORAL
  Filled 2018-01-30 (×2): qty 1

## 2018-01-30 MED ORDER — ADULT MULTIVITAMIN W/MINERALS CH
1.0000 | ORAL_TABLET | Freq: Every day | ORAL | Status: DC
  Administered 2018-01-30: 1 via ORAL
  Filled 2018-01-30: qty 1

## 2018-01-30 MED ORDER — MELATONIN 5 MG PO TABS
5.0000 mg | ORAL_TABLET | Freq: Every day | ORAL | Status: DC
  Administered 2018-01-30: 5 mg via ORAL
  Filled 2018-01-30 (×2): qty 1

## 2018-01-30 MED ORDER — VITAMIN B-12 1000 MCG PO TABS
1000.0000 ug | ORAL_TABLET | Freq: Every day | ORAL | Status: DC
  Administered 2018-01-30: 1000 ug via ORAL
  Filled 2018-01-30: qty 1

## 2018-01-30 MED ORDER — ACETAMINOPHEN 325 MG PO TABS: 650.0000 mg | ORAL_TABLET | Freq: Four times a day (QID) | ORAL | Status: DC | PRN

## 2018-01-30 MED ORDER — DULOXETINE HCL 30 MG PO CPEP: 90.0000 mg | ORAL_CAPSULE | Freq: Every day | ORAL | Status: DC

## 2018-01-30 MED ORDER — ASPIRIN EC 81 MG PO TBEC
81.0000 mg | DELAYED_RELEASE_TABLET | Freq: Every day | ORAL | Status: DC
  Administered 2018-01-30: 81 mg via ORAL
  Filled 2018-01-30: qty 1

## 2018-01-30 MED ORDER — DULOXETINE HCL 30 MG PO CPEP
30.0000 mg | ORAL_CAPSULE | Freq: Every day | ORAL | Status: DC
  Filled 2018-01-30: qty 1

## 2018-01-30 MED ORDER — ALPRAZOLAM 0.25 MG PO TABS
0.2500 mg | ORAL_TABLET | Freq: Two times a day (BID) | ORAL | Status: DC | PRN
  Administered 2018-01-30: 0.25 mg via ORAL
  Filled 2018-01-30: qty 1

## 2018-01-30 MED ORDER — VITAMIN D3 25 MCG (1000 UNIT) PO TABS
1000.0000 [IU] | ORAL_TABLET | Freq: Every day | ORAL | Status: DC
  Administered 2018-01-30 (×2): 1000 [IU] via ORAL
  Filled 2018-01-30 (×3): qty 1

## 2018-01-30 MED ORDER — MAGNESIUM OXIDE 400 (241.3 MG) MG PO TABS
400.0000 mg | ORAL_TABLET | Freq: Every day | ORAL | Status: DC
  Administered 2018-01-30: 400 mg via ORAL
  Filled 2018-01-30: qty 1

## 2018-01-30 MED ORDER — SODIUM CHLORIDE 0.9% FLUSH: 3.0000 mL | INTRAVENOUS | Status: DC | PRN

## 2018-01-30 MED ORDER — SODIUM CHLORIDE 0.9 % IV SOLN: 250.0000 mL | INTRAVENOUS | Status: DC | PRN

## 2018-01-30 MED ORDER — ATORVASTATIN CALCIUM 20 MG PO TABS
20.0000 mg | ORAL_TABLET | Freq: Every day | ORAL | Status: DC
  Administered 2018-01-30: 20 mg via ORAL
  Filled 2018-01-30: qty 1

## 2018-01-30 MED ORDER — DONEPEZIL HCL 5 MG PO TABS
10.0000 mg | ORAL_TABLET | Freq: Every day | ORAL | Status: DC
  Administered 2018-01-30: 10 mg via ORAL
  Filled 2018-01-30 (×2): qty 2

## 2018-01-30 MED ORDER — ASPIRIN 81 MG PO CHEW: 81.0000 mg | CHEWABLE_TABLET | Freq: Every day | ORAL | Status: DC

## 2018-01-30 MED ORDER — FERROUS SULFATE 325 (65 FE) MG PO TABS
325.0000 mg | ORAL_TABLET | Freq: Every day | ORAL | Status: DC
  Administered 2018-01-30: 325 mg via ORAL
  Filled 2018-01-30: qty 1

## 2018-01-30 MED ORDER — DULOXETINE HCL 30 MG PO CPEP
60.0000 mg | ORAL_CAPSULE | Freq: Every day | ORAL | Status: DC
  Filled 2018-01-30: qty 2

## 2018-01-30 MED ORDER — BACITRACIN ZINC 500 UNIT/GM EX OINT
TOPICAL_OINTMENT | Freq: Every day | CUTANEOUS | Status: DC
  Administered 2018-01-30: 15:00:00 via TOPICAL
  Filled 2018-01-30: qty 28.35

## 2018-01-30 NOTE — Consult Note (Signed)
  [facility] Vascular Consult Note   Please refer to my January 22, 2018 progress note when I last saw the patient in our office.  This highlights our plan for bilateral lower extremity angiograms.  1) The patient is on Dr. Bunnie Domino scheduled to undergo a right lower extremity angiogram with possible intervention on Monday, February 03, 2018.  We plan on continuing with this procedure on Monday if the patient is medically stable.  2) The patient is scheduled to undergo a left lower extremity angiogram with possible intervention with Dr. Lucky Cowboy on February 10, 2018   [place], PA-C  02/14/2018 7:19 PM

## 2018-01-30 NOTE — Progress Notes (Signed)
[place] at [facility]                                                                                                                                                                                  Patient Demographics   Mindy Cortez, is a 83 y.o. female, DOB - 1935/03/01, Derma date - 02/18/2018   Admitting Physician Gorden Harms, MD  Outpatient Primary MD for the patient is Marinda Elk, MD   LOS - 0  Subjective: Patient admitted with progressive shortness of breath Noted to have bilateral pleural effusions    Review of Systems:   CONSTITUTIONAL: No documented fever. No fatigue, weakness. No weight gain, no weight loss.  EYES: No blurry or double vision.  ENT: No tinnitus. No postnasal drip. No redness of the oropharynx.  RESPIRATORY: No cough, no wheeze, no hemoptysis.  Positive dyspnea.  CARDIOVASCULAR: No chest pain. No orthopnea. No palpitations. No syncope.  GASTROINTESTINAL: No nausea, no vomiting or diarrhea. No abdominal pain. No melena or hematochezia.  GENITOURINARY: No dysuria or hematuria.  ENDOCRINE: No polyuria or nocturia. No heat or cold intolerance.  HEMATOLOGY: No anemia. No bruising. No bleeding.  INTEGUMENTARY: No rashes.  Chronic right lower extremity ulcers MUSCULOSKELETAL: No arthritis. No swelling. No gout.  NEUROLOGIC: No numbness, tingling, or ataxia. No seizure-type activity.  PSYCHIATRIC: No anxiety. No insomnia. No ADD.    Vitals:   Vitals:   02/18/2018 0802 02/04/2018 1129 02/14/2018 1154 02/10/2018 1600  BP: 127/74 121/79 125/81 117/84  Pulse: 88 100 100 94  Resp: 16 16 16 16   Temp: (!) 97.3 F (36.3 C)   97.9 F (36.6 C)  TempSrc: Oral   Oral  SpO2: 96% 100% 100% 100%  Weight:        Wt Readings from Last 3 Encounters:  02/04/2018 61.1 kg (134 lb 9.6 oz)  01/22/18 59 kg (130 lb)  01/15/18 54.4 kg (120 lb)    No intake or output data in the 24 hours ending 01/26/2018 1713  Physical  Exam:   GENERAL: Pleasant-appearing in no apparent distress.  HEAD, EYES, EARS, NOSE AND THROAT: Atraumatic, normocephalic. Extraocular muscles are intact. Pupils equal and reactive to light. Sclerae anicteric. No conjunctival injection. No oro-pharyngeal erythema.  NECK: Supple. There is no jugular venous distention. No bruits, no lymphadenopathy, no thyromegaly.  HEART: Regular rate and rhythm,. No murmurs, no rubs, no clicks.  LUNGS: Bilateral crackles at the bases stenosis or muscle usage ABDOMEN: Soft, flat, nontender, nondistended. Has good bowel sounds. No hepatosplenomegaly appreciated.  EXTREMITIES: No evidence of any cyanosis, clubbing, or peripheral edema.  Right foot cool to touch with diminished  pulses NEUROLOGIC: The patient is alert, awake, and oriented x3 with no focal motor or sensory deficits appreciated bilaterally.  SKIN: Moist and warm with no rashes appreciated.  Psych: Not anxious, depressed LN: No inguinal LN enlargement    Antibiotics   Anti-infectives (From admission, onward)   None      Medications   Scheduled Meds: . acidophilus  1 capsule Oral Daily  . apixaban  2.5 mg Oral BID  . aspirin EC  81 mg Oral Daily  . atorvastatin  20 mg Oral Daily  . bacitracin   Topical Daily  . cholecalciferol  1,000 Units Oral Daily  . donepezil  10 mg Oral QHS  . DULoxetine  90 mg Oral Daily  . ferrous sulfate  325 mg Oral Q breakfast  . furosemide  40 mg Intravenous BID  . magnesium oxide  400 mg Oral Daily  . Melatonin  5 mg Oral QHS  . metoprolol tartrate  12.5 mg Oral BID  . multivitamin with minerals  1 tablet Oral Daily  . multivitamin-lutein  1 capsule Oral Daily  . potassium chloride  10 mEq Oral Daily  . sodium chloride flush  3 mL Intravenous Q12H  . vitamin B-12  1,000 mcg Oral Daily   Continuous Infusions: . sodium chloride     PRN Meds:.sodium chloride, acetaminophen, ALPRAZolam, diphenhydrAMINE, loperamide, ondansetron (ZOFRAN) IV, sodium  chloride flush   Data Review:   Micro Results Recent Results (from the past 240 hour(s))  MRSA PCR Screening     Status: None   Collection Time: 02/05/2018  6:53 AM  Result Value Ref Range Status   MRSA by PCR NEGATIVE NEGATIVE Final    Comment:        The GeneXpert MRSA Assay (FDA approved for NASAL specimens only), is one component of a comprehensive MRSA colonization surveillance program. It is not intended to diagnose MRSA infection nor to guide or monitor treatment for MRSA infections. Performed at [facility], [address] [place], [place] [address]   Body fluid culture     Status: None (Preliminary result)   Collection Time: 02/11/2018 12:09 PM  Result Value Ref Range Status   Specimen Description   Final    PLEURAL Performed at [facility], [address] [place], [place] [address]    Special Requests   Final    NONE Performed at [facility], [address] [place], [place] [address]    Gram Stain   Final    MODERATE WBC PRESENT,BOTH PMN AND MONONUCLEAR NO ORGANISMS SEEN Performed at [facility], [place] [address] [place], [place] [address]    Culture PENDING  Incomplete   Report Status PENDING  Incomplete    Radiology Reports Dg Chest 1 View  Result Date: 02/08/2018 CLINICAL DATA:  Right-sided thoracentesis. EXAM: CHEST  1 VIEW COMPARISON:  CT 02/16/2018.  Chest x-ray 01/20/2018. FINDINGS: Mediastinum hilar structures are normal. Cardiomegaly with bilateral interstitial prominence, left side greater than right. Findings suggest interstitial edema. Findings have improved from prior exams. Small residual left pleural effusion. Previously identified large right pleural effusion has resolved. No pneumothorax post thoracentesis. IMPRESSION: 1.  No pneumothorax post thoracentesis. 2.  Findings consistent with resolving CHF. Electronically Signed   By: Marcello Moores  Register   On: 02/09/2018 12:34   Ct Angio Chest Pe W Or Wo  Contrast  Result Date: 02/03/2018 CLINICAL DATA:  Shortness of breath. EXAM: CT ANGIOGRAPHY CHEST WITH CONTRAST TECHNIQUE: Multidetector CT imaging of the  chest was performed using the standard protocol during bolus administration of intravenous contrast. Multiplanar CT image reconstructions and MIPs were obtained to evaluate the vascular anatomy. CONTRAST:  20mL OMNIPAQUE IOHEXOL 350 MG/ML SOLN COMPARISON:  Chest radiograph earlier this day. FINDINGS: Cardiovascular: There are no filling defects within the pulmonary arteries to suggest pulmonary embolus. Dilated main pulmonary artery measuring 3.7 cm consistent with pulmonary arterial hypertension. Atherosclerotic calcifications of the thoracic aorta. Multi chamber cardiomegaly. Contrast refluxing into the hepatic veins and IVC. There are coronary artery calcifications. Mediastinum/Nodes: No enlarged mediastinal or hilar lymph nodes. The esophagus is decompressed. Lungs/Pleura: Moderate to large right and moderate left pleural effusion. Septal thickening and ground-glass opacities throughout both lungs consistent with pulmonary edema. Compressive atelectasis adjacent to pleural effusions. Trachea and mainstem bronchi are patent. Upper Abdomen: Contrast refluxing into the IVC. No other acute finding. Musculoskeletal: There are no acute or suspicious osseous abnormalities. Review of the MIP images confirms the above findings. IMPRESSION: 1. No pulmonary embolus. 2. Congestive heart failure with cardiomegaly, pulmonary edema and moderate to large pleural effusions. 3. Dilatation of main pulmonary artery consistent with pulmonary arterial hypertension. 4. Aortic Atherosclerosis (ICD10-I70.0). Coronary artery calcifications. Electronically Signed   By: Jeb Levering M.D.   On: 02/04/2018 04:55   Dg Chest Portable 1 View  Result Date: 02/13/2018 CLINICAL DATA:  Shortness of breath. EXAM: PORTABLE CHEST 1 VIEW COMPARISON:  Radiograph 01/15/2018 FINDINGS:  Cardiomegaly again seen. Progressive perihilar pulmonary edema. Increased bilateral pleural effusions. No pneumothorax. IMPRESSION: Progressive CHF with worsening pulmonary edema and development of pleural effusions from 01/15/2018 chest radiograph. Electronically Signed   By: Jeb Levering M.D.   On: 01/20/2018 04:15   Dg Abdomen Acute W/chest  Result Date: 01/15/2018 CLINICAL DATA:  Left flank pain, no dysuria or urinary frequency. EXAM: DG ABDOMEN ACUTE W/ 1V CHEST COMPARISON:  Chest x-ray of December 16, 2017 FINDINGS: The lungs are adequately inflated. The interstitial markings are increased. The cardiac silhouette is enlarged and the pulmonary vascularity engorged and less distinct. There is a trace of pleural fluid bilaterally. Within the abdomen the bowel gas pattern is normal. No definite urinary tract stones are observed. There are severe degenerative changes of the right hip. IMPRESSION: No calcified urinary tract stones are observed. No acute intra-abdominal abnormality. Chronic bronchitic changes with superimposed CHF with moderate interstitial edema. Electronically Signed   By: David  Martinique M.D.   On: 01/15/2018 10:01   US Thoracentesis Asp Pleural Space W/img Guide  Result Date: 02/02/2018 CLINICAL DATA:  Congestive heart failure and bilateral pleural effusions, right greater than left. EXAM: ULTRASOUND GUIDED RIGHT THORACENTESIS COMPARISON:  None. PROCEDURE: An ultrasound guided thoracentesis was thoroughly discussed with the patient and questions answered. The benefits, risks, alternatives and complications were also discussed. The patient understands and wishes to proceed with the procedure. Written consent was obtained. Ultrasound was performed to localize and mark an adequate pocket of fluid in the right chest. The area was then prepped and draped in the normal sterile fashion. 1% Lidocaine was used for local anesthesia. Under ultrasound guidance a 6 French Safe-T-Centesis catheter  was introduced. Thoracentesis was performed. The catheter was removed and a dressing applied. COMPLICATIONS: None FINDINGS: A total of approximately 1.2 L of clear, yellow fluid was removed. IMPRESSION: Successful ultrasound guided right thoracentesis yielding 1.2 L of pleural fluid. Electronically Signed   By: Aletta Edouard M.D.   On: 02/15/2018 14:10     CBC Recent Labs  Lab 01/20/2018 0224  WBC 10.3  HGB 10.3*  HCT 33.2*  PLT 366  MCV 84.2  MCH 26.0  MCHC 30.9*  RDW 24.7*  LYMPHSABS 1.3  MONOABS 1.0*  EOSABS 0.1  BASOSABS 0.1    Chemistries  Recent Labs  Lab 02/01/2018 0224  NA 136  K 3.6  CL 104  CO2 22  GLUCOSE 157*  BUN 16  CREATININE 0.98  CALCIUM 8.0*  AST 28  ALT 14  ALKPHOS 107  BILITOT 0.8   ------------------------------------------------------------------------------------------------------------------ estimated creatinine clearance is 40.5 mL/min (by C-G formula based on SCr of 0.98 mg/dL). ------------------------------------------------------------------------------------------------------------------ No results for input(s): HGBA1C in the last 72 hours. ------------------------------------------------------------------------------------------------------------------ No results for input(s): CHOL, HDL, LDLCALC, TRIG, CHOLHDL, LDLDIRECT in the last 72 hours. ------------------------------------------------------------------------------------------------------------------ Recent Labs    02/03/2018 0654  TSH 2.211   ------------------------------------------------------------------------------------------------------------------ No results for input(s): VITAMINB12, FOLATE, FERRITIN, TIBC, IRON, RETICCTPCT in the last 72 hours.  Coagulation profile No results for input(s): INR, PROTIME in the last 168 hours.  No results for input(s): DDIMER in the last 72 hours.  Cardiac Enzymes Recent Labs  Lab 02/07/2018 0654 01/23/2018 1330  TROPONINI <0.03 <0.03    ------------------------------------------------------------------------------------------------------------------ Invalid input(s): POCBNP    Assessment & Plan   IMPRESSION AND PLAN: 1 acute on chronic systolic congestive heart failure exacerbation Continue IV Lasix, Lopressor  2 acute hypoxic respiratory failure Secondary to congestive heart failure exacerbation and bilateral pleural effusions Patient will have thoracentesis on the larger side  3 acute right lower extremity limb ischemia Consult vascular surgery evaluation, continue aspirin/Eliquis for now, statin therapy,   4 acute on chronic right foot ulcerations on first/second toe Podiatry consulted  Continue local wound care  5 history of chronic A. 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 May use oxygen, suction and manual treatment of airway obstruction as needed for comfort.      02/02/2018 1019    Code Status History    Date Active Date Inactive Code Status Order ID Comments User Context   02/09/2018 0645 02/02/2018 1019 Full Code [id number]  Gorden Harms, MD Inpatient   12/15/2017 1316 12/16/2017  1624 DNR [id number]  Hillary Bow, MD Inpatient   12/15/2017 0119 12/15/2017 1316 Full Code [id number]  Lance Coon, MD ED   10/15/2017 1744 10/17/2017 2034 Full Code [id number]  Nicholes Mango, MD ED   10/10/2017 2011 10/12/2017 1834 Full Code [id number]  Demetrios Loll, MD Inpatient   06/18/2016 1253 06/18/2016 1833 Full Code [id number]  Algernon Huxley, MD Inpatient    Advance Directive Documentation     Most Recent Value  Type of Advance Directive  Healthcare Power of Attorney  Pre-existing out of  facility DNR order (yellow form or pink MOST form)  -  "MOST" Form in Place?  -           Consults podiatry and vascular surgery   DVT Prophylaxis Eliquis  Lab Results  Component Value Date   PLT 366 02/11/2018     Time Spent in minutes 35 minutes greater than 50% of time spent in care coordination and counseling patient regarding the condition and plan of care.   Dustin Flock M.D on 01/23/2018 at 5:13 PM  Between 7am to 6pm - Pager - [phone]  After 6pm go to [email or web address] - Proofreader  [employer]   Office  [phone]

## 2018-01-30 NOTE — Care Management (Signed)
Patient from [facility].  RNCM consult for heart failure protocol.  Patient previous open with [facility] to Heart Failure program.  Message sent to [facility] with [facility] to determine if patient is still open.  PT consult pending.

## 2018-01-30 NOTE — Progress Notes (Signed)
MD patel was notified that pt had a 18 run of [place]. No futher orders at this time.

## 2018-01-30 NOTE — Consult Note (Signed)
[place] Nurse wound consult note Reason for Consult: right leg partial thickness, MASD buttocks  Wound type:Intertriginous dermatitis, venous insufficiency Pressure Injury POA: NA Measurement: Gluteal fold, 4cm x 2cm x 0.1cm MASD pink, moist wound bed. Right posterior calf has two small areas of partial thickness wounds with pink, moist, weeping wound beds, RLE is reddened and edematous.  Wound bed:see above Drainage (amount, consistency, odor) see above Periwound: RLE is reddened and edematous, Buttocks periwound has MASD Intertriginous related.  Dressing procedure/placement/frequency: I have provided nurses with orders for a low air loss mattress to facilitate drying of the affected area. Use of Purple Top Criticaid for MASD. I Have ordered Prevalon boot for right foot due to inability to straighten leg, need to prevent lateral malleolus wound. Have ordered pressure alleviating chair pad for home and hospital use. Dr. Vickki Muff has ordered topical care for toe wounds, (non adherent and antibiotic ointment)  will continue that to include right lower leg wounds. We will not follow, but will remain available to this patient, to nursing, and the medical and/or surgical teams.  Please re-consult if we need to assist further.   Fara Olden, RN-C, WTA-C Wound Treatment Associate

## 2018-01-30 NOTE — ED Notes (Addendum)
Pts right foot is ashen in color, cold to the touch and is missing great toenail. Sore to the second toe. Pedal pulse found with doppler and marked with X on foot. MD made aware and in room. Pts foot and wounds wrapped.

## 2018-01-30 NOTE — Progress Notes (Signed)
Advanced care plan.  Purpose of the Encounter: CODE STATUS  Parties in Attendance: Patient and [name]  Patient's Decision Capacity: Intact  Subjective/Patient's story: Patient is a 83 year old with history of congestive heart failure, chronic immobility likely peripheral vascular disease admitted with CHF   Objective/Medical story I discussed with the patient is daughter regarding CPR intubation according to the daughter patient has a DNR in place.  So at this point I will change her CODE STATUS to DNR   Goals of care determination: DNR    CODE STATUS: DNR   Time spent discussing advanced care planning: 16 minutes

## 2018-01-30 NOTE — Progress Notes (Signed)
Initial Nutrition Assessment  DOCUMENTATION CODES:   Non-severe (moderate) malnutrition in context of chronic illness  INTERVENTION:   Carnation Instant Breakfast BID- each packet provides 130kcal and 5g protein   Magic cup TID with meals, each supplement provides 290 kcal and 9 grams of protein  Liberalize diet   Ocuvite daily for wound healing (provides zinc, vitamin A, vitamin C, Vitamin E, copper, and selenium)  NUTRITION DIAGNOSIS:   Moderate Malnutrition related to chronic illness(lymphoma, COPD, CHF) as evidenced by moderate fat depletion, moderate muscle depletion  GOAL:   Patient will meet greater than or equal to 90% of their needs  MONITOR:   PO intake, Supplement acceptance, Labs, Weight trends, Skin, I & O's  REASON FOR ASSESSMENT:   Malnutrition Screening Tool    ASSESSMENT:   83 y/o female with h/o lymphoma s/p bone marrow transplant/chemo/XRT, COPD, B12 deficiency, CHF, dementia, admitted with CHF and toe ulcers   Met with pt in room today. Pt is a poor historian but reports good appetite and oral intake at baseline. Pt reports eating 100% of her meals today; unsure if this is accurate information. Per chart, pt lost 30lbs from December to February but has regained ~13lbs over the past couple of months. Pt reports that she does not like anything greasy and she does not like supplements. RD will liberalize diet and order supplements. Pt is willing to try carnation instant breakfast with skim milk. RD will also order Ocuvite to encourage wound healing. Pt s/p thoracentesis today with 1.2 L fluid removal.   Medications reviewed and include: risaquad, aspirin, vitamin D, ferrous sulfate, lasix, Mg oxide, melatonin, MVI, KCl, B12  Labs reviewed: K 3.6 wnl, Ca 8.0(L) adj. 9.36 wnl, alb 2.3(L) BNP- 3439(H)- 4/11 Hgb 10.3(L), Hct 33.2(L)  NUTRITION - FOCUSED PHYSICAL EXAM:    Most Recent Value  Orbital Region  No depletion  Upper Arm Region  Moderate depletion   Thoracic and Lumbar Region  Moderate depletion  Buccal Region  Mild depletion  Temple Region  No depletion  Clavicle Bone Region  Moderate depletion  Clavicle and Acromion Bone Region  Moderate depletion  Scapular Bone Region  Moderate depletion  Dorsal Hand  Moderate depletion  Patellar Region  No depletion  Anterior Thigh Region  No depletion  Posterior Calf Region  No depletion  Edema (RD Assessment)  Mild  Hair  Reviewed  Eyes  Reviewed  Mouth  Reviewed  Skin  Reviewed  Nails  Reviewed     Diet Order:  Diet 2 gram sodium Room service appropriate? Yes; Fluid consistency: Thin; Fluid restriction: 1200 mL Fluid  EDUCATION NEEDS:   No education needs have been identified at this time  Skin:  Skin Assessment: ( wounds legs, gluteal fold, 4cm x 2cm x 0.1cm MASD, toe ulcers )  Last BM:  pta  Height:   Ht Readings from Last 1 Encounters:  01/22/18 _0  (1.651 m)    Weight:   Wt Readings from Last 1 Encounters:  02/01/2018 134 lb 9.6 oz (61.1 kg)    Ideal Body Weight:  56.8 kg  BMI:  Body mass index is 22.4 kg/m.  Estimated Nutritional Needs:   Kcal:  1400-1600kcal/day   Protein:  67-79g/day   Fluid:  >1.4L/day   Koleen Distance MS, RD, LDN Pager #[phone] After Hours Pager: [phone]

## 2018-01-30 NOTE — H&P (Signed)
[facility] [place] NAME: Mindy Cortez    MR#:  [id number]  DATE OF BIRTH:  1935-05-25  DATE OF ADMISSION:  02/02/2018  PRIMARY CARE PHYSICIAN: Marinda Elk, MD   REQUESTING/REFERRING PHYSICIAN:   CHIEF COMPLAINT:  No chief complaint on file.   HISTORY OF PRESENT ILLNESS: Mindy Cortez  is a 83 y.o. female with a known history per below presenting from assisted living facility with shortness of breath acutely, in the emergency room patient was noted to have O2 saturation in the 80s, ER workup noted for BNP greater than 3400, CT chest negative for PE/noted congestive heart failure/bilateral pleural effusions right greater than left/pulmonary hypertension, patient evaluated emergency room, no apparent distress, resting comfortably in bed, patient with right lower leg/foot dressing clean/intact-patient noted to have cold foot with absence of pulses, noted foot cyanosis, superficial dorsal surface ulcerations of the first and second toe, patient is now been admitted for acute on chronic systolic congestive heart failure exacerbation, bilateral pleural effusions right greater than left, and acute right limb ischemia with chronic dorsal surface wounds to first/second toe.  PAST MEDICAL HISTORY:   Past Medical History:  Diagnosis Date  . AICD (automatic cardioverter/defibrillator) present    non-functioning due to no active battery in place  . Atrial fibrillation ([place])   . CHF (congestive heart failure) ([place])   . Depression   . 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 HTN (hypertension)   . Hypertension   . Neuropathy   . Nocturia   . Non-Hodgkin lymphoma ([place])    Bone Marrow Transplant with chemo + Rad tx's.  . Tachycardia   . Urge incontinence   . Urinary incontinence     PAST SURGICAL HISTORY:  Past Surgical History:  Procedure Laterality Date  . BONE MARROW TRANSPLANT    . COMPLICATED  TOTAL HIP PROSTHESIS AND METHYLMETHACRALATE REMOVAL W/O SPACER INSERTION    . PERIPHERAL VASCULAR CATHETERIZATION Right 06/18/2016   Procedure: Lower Extremity Angiography;  Surgeon: Algernon Huxley, MD;  Location: [facility];  Service: Cardiovascular;  Laterality: Right;  . PERIPHERAL VASCULAR CATHETERIZATION  06/18/2016   Procedure: Lower Extremity Intervention;  Surgeon: Algernon Huxley, MD;  Location: [facility];  Service: Cardiovascular;;  . TOTAL HIP ARTHROPLASTY  [id number]    SOCIAL HISTORY:  Social History   Tobacco Use  . Smoking status: Never Smoker  . Smokeless tobacco: Never Used  Substance Use Topics  . Alcohol use: Yes    Alcohol/week: 0.0 oz    Comment: rarely    FAMILY HISTORY:  Family History  Problem Relation Age of Onset  . Osteosarcoma Father   . Heart disease Father   . Heart disease Mother   . Breast cancer Mother     DRUG ALLERGIES:  Allergies  Allergen Reactions  . Penicillin V Potassium Hives    Has patient had a PCN reaction causing immediate rash, facial/tongue/throat swelling, SOB or lightheadedness with hypotension: Unknown Has patient had a PCN reaction causing severe rash involving mucus membranes or skin necrosis: Unknown Has patient had a PCN reaction that required hospitalization: Unknown Has patient had a PCN reaction occurring within the last 10 years: Unknown If all of the above answers are "NO", then may proceed with Cephalosporin use.   Marland Kitchen Prochlorperazine Nausea And Vomiting    DIZZINESS, DRUNK FEELING.   . Ramipril Other (See Comments)  SEVERE HEADACHE   . Rifampin     Severe stomach irriatates cramping and nausea  . Sulfa Antibiotics     TOLD AS A CHILD, MADE PT NERVOUS, EMOTIONAL, TOLD NOT TO TAKE.  . Cephalexin Rash    RASH, ITCHING.     REVIEW OF SYSTEMS: Difficult to obtain given mild intermittent confusion  CONSTITUTIONAL: No fever, fatigue or weakness.  EYES: No blurred or double vision.  EARS, NOSE,  AND THROAT: No tinnitus or ear pain.  RESPIRATORY: + cough, shortness of breath, no wheezing or hemoptysis.  CARDIOVASCULAR: No chest pain, orthopnea, edema.  GASTROINTESTINAL: No nausea, vomiting, diarrhea or abdominal pain.  GENITOURINARY: No dysuria, hematuria.  ENDOCRINE: No polyuria, nocturia,  HEMATOLOGY: No anemia, easy bruising or bleeding SKIN: No rash or lesion. MUSCULOSKELETAL: No joint pain or arthritis.   NEUROLOGIC: No tingling, numbness, weakness.  PSYCHIATRY: No anxiety or depression.   MEDICATIONS AT HOME:  Prior to Admission medications   Medication Sig Start Date End Date Taking? Authorizing Provider  acidophilus (RISAQUAD) CAPS capsule Take 1 capsule by mouth daily.   Yes [provider]  apixaban (ELIQUIS) 2.5 MG TABS tablet Take 2.5 mg by mouth 2 (two) times daily.   Yes [provider]  aspirin 81 MG chewable tablet Chew 81 mg by mouth daily.   Yes [provider]  atorvastatin (LIPITOR) 20 MG tablet Take 20 mg by mouth daily.   Yes [provider]  cholecalciferol (VITAMIN D) 1000 units tablet Take 1,000 Units by mouth daily.   Yes [provider]  donepezil (ARICEPT) 10 MG tablet Take 10 mg by mouth at bedtime.   Yes [provider]  DULoxetine (CYMBALTA) 30 MG capsule Take 3 capsules (90 mg total) by mouth daily. Patient taking differently: Take 30 mg by mouth daily. Take with 60 mg dose = 90 mg once daily 10/18/17  Yes Vaughan Basta, MD  DULoxetine (CYMBALTA) 60 MG capsule Take 60 mg by mouth daily. Take with 30 mg dose = 90 mg once daily   Yes [provider]  ferrous sulfate 325 (65 FE) MG tablet Take 325 mg by mouth daily with breakfast.    Yes [provider]  furosemide (LASIX) 20 MG tablet Take 1 tablet (20 mg total) by mouth daily. Take  tablet (10MG ) by mouth daily and 1 tablet (20MG ) as needed for fluid retention Patient taking differently: Take 20 mg by mouth daily as  needed for edema.  01/15/18  Yes Lisa Roca, MD  magnesium oxide (MAG-OX) 400 MG tablet Take 400 mg by mouth daily.   Yes [provider]  Melatonin 3 MG TBDP Take 3 mg by mouth at bedtime.   Yes [provider]  metoprolol tartrate (LOPRESSOR) 25 MG tablet Take 0.5 tablets (12.5 mg total) by mouth 2 (two) times daily. 10/17/17  Yes Vaughan Basta, MD  Multiple Vitamin (MULTIVITAMIN WITH MINERALS) TABS tablet Take 1 tablet by mouth daily.   Yes [provider]  acetaminophen (TYLENOL) 325 MG tablet Take 650 mg by mouth every 6 (six) hours as needed for moderate pain or headache.     [provider]  potassium chloride (K-DUR) 10 MEQ tablet Take 1 tablet (10 mEq total) by mouth daily. 01/15/18   Lisa Roca, MD  vitamin B-12 (CYANOCOBALAMIN) 1000 MCG tablet Take 1,000 mcg by mouth daily.    [provider]      PHYSICAL EXAMINATION:   VITAL SIGNS: Blood pressure 108/68, pulse 90, resp. rate  20, SpO2 96 %.  GENERAL:  83 y.o.-year-old patient lying in the bed with no acute distress.  Frail-appearing EYES: Pupils equal, round, reactive to light and accommodation. No scleral icterus. Extraocular muscles intact.  HEENT: Head atraumatic, normocephalic. Oropharynx and nasopharynx clear.  NECK:  Supple, no jugular venous distention. No thyroid enlargement, no tenderness.  LUNGS: Rales on auscultation of the lungs at bases-mild. No use of accessory muscles of respiration.  CARDIOVASCULAR: S1, S2 normal. No murmurs, rubs, or gallops.  ABDOMEN: Soft, nontender, nondistended. Bowel sounds present. No organomegaly or mass.  EXTREMITIES: No pedal edema, cyanosis, or clubbing.  NEUROLOGIC: Cranial nerves II through XII are intact. MAES. Gait not checked.  PSYCHIATRIC: The patient is alert and oriented x 3.  SKIN: No obvious rash, lesion.  Acute right cold foot with cyanosis, absent pulses, superficial dorsal wounds on first and second toe  LABORATORY  PANEL:   CBC Recent Labs  Lab 01/27/2018 0224  WBC 10.3  HGB 10.3*  HCT 33.2*  PLT 366  MCV 84.2  MCH 26.0  MCHC 30.9*  RDW 24.7*  LYMPHSABS 1.3  MONOABS 1.0*  EOSABS 0.1  BASOSABS 0.1   ------------------------------------------------------------------------------------------------------------------  Chemistries  Recent Labs  Lab 01/27/2018 0224  NA 136  K 3.6  CL 104  CO2 22  GLUCOSE 157*  BUN 16  CREATININE 0.98  CALCIUM 8.0*  AST 28  ALT 14  ALKPHOS 107  BILITOT 0.8   ------------------------------------------------------------------------------------------------------------------ estimated creatinine clearance is 40.5 mL/min (by C-G formula based on SCr of 0.98 mg/dL). ------------------------------------------------------------------------------------------------------------------ No results for input(s): TSH, T4TOTAL, T3FREE, THYROIDAB in the last 72 hours.  Invalid input(s): FREET3   Coagulation profile No results for input(s): INR, PROTIME in the last 168 hours. ------------------------------------------------------------------------------------------------------------------- No results for input(s): DDIMER in the last 72 hours. -------------------------------------------------------------------------------------------------------------------  Cardiac Enzymes No results for input(s): CKMB, TROPONINI, MYOGLOBIN in the last 168 hours.  Invalid input(s): CK ------------------------------------------------------------------------------------------------------------------ Invalid input(s): POCBNP  ---------------------------------------------------------------------------------------------------------------  Urinalysis    Component Value Date/Time   COLORURINE YELLOW (A) 01/15/2018 0735   APPEARANCEUR CLEAR (A) 01/15/2018 0735   APPEARANCEUR Clear 04/08/2015 1558   LABSPEC 1.013 01/15/2018 0735   PHURINE 5.0 01/15/2018 0735   GLUCOSEU NEGATIVE  01/15/2018 0735   HGBUR NEGATIVE 01/15/2018 0735   BILIRUBINUR NEGATIVE 01/15/2018 0735   BILIRUBINUR Negative 04/08/2015 1558   KETONESUR NEGATIVE 01/15/2018 0735   PROTEINUR NEGATIVE 01/15/2018 0735   NITRITE NEGATIVE 01/15/2018 0735   LEUKOCYTESUR NEGATIVE 01/15/2018 0735   LEUKOCYTESUR Negative 04/08/2015 1558     RADIOLOGY: Ct Angio Chest Pe W Or Wo Contrast  Result Date: 02/13/2018 CLINICAL DATA:  Shortness of breath. EXAM: CT ANGIOGRAPHY CHEST WITH CONTRAST TECHNIQUE: Multidetector CT imaging of the chest was performed using the standard protocol during bolus administration of intravenous contrast. Multiplanar CT image reconstructions and MIPs were obtained to evaluate the vascular anatomy. CONTRAST:  88mL OMNIPAQUE IOHEXOL 350 MG/ML SOLN COMPARISON:  Chest radiograph earlier this day. FINDINGS: Cardiovascular: There are no filling defects within the pulmonary arteries to suggest pulmonary embolus. Dilated main pulmonary artery measuring 3.7 cm consistent with pulmonary arterial hypertension. Atherosclerotic calcifications of the thoracic aorta. Multi chamber cardiomegaly. Contrast refluxing into the hepatic veins and IVC. There are coronary artery calcifications. Mediastinum/Nodes: No enlarged mediastinal or hilar lymph nodes. The esophagus is decompressed. Lungs/Pleura: Moderate to large right and moderate left pleural effusion. Septal thickening and ground-glass opacities throughout both lungs consistent with pulmonary edema. Compressive atelectasis adjacent to pleural effusions. Trachea and mainstem bronchi are  patent. Upper Abdomen: Contrast refluxing into the IVC. No other acute finding. Musculoskeletal: There are no acute or suspicious osseous abnormalities. Review of the MIP images confirms the above findings. IMPRESSION: 1. No pulmonary embolus. 2. Congestive heart failure with cardiomegaly, pulmonary edema and moderate to large pleural effusions. 3. Dilatation of main pulmonary artery  consistent with pulmonary arterial hypertension. 4. Aortic Atherosclerosis (ICD10-I70.0). Coronary artery calcifications. Electronically Signed   By: Jeb Levering M.D.   On: 01/29/2018 04:55   Dg Chest Portable 1 View  Result Date: 01/20/2018 CLINICAL DATA:  Shortness of breath. EXAM: PORTABLE CHEST 1 VIEW COMPARISON:  Radiograph 01/15/2018 FINDINGS: Cardiomegaly again seen. Progressive perihilar pulmonary edema. Increased bilateral pleural effusions. No pneumothorax. IMPRESSION: Progressive CHF with worsening pulmonary edema and development of pleural effusions from 01/15/2018 chest radiograph. Electronically Signed   By: Jeb Levering M.D.   On: 01/27/2018 04:15    EKG: Orders placed or performed during the hospital encounter of 02/03/18  . EKG 12-Lead  . EKG 12-Lead    IMPRESSION AND PLAN: 1 acute on chronic systolic congestive heart failure exacerbation Compounded by bilateral pleural effusions right greater than left Admit to regular nursing for bed, on Eliquis/aspirin, statin therapy, Lopressor IV Lasix twice daily, strict I&O monitoring, daily weights, consult IR for possible thoracentesis evaluation  2 acute hypoxic respiratory failure Secondary to congestive heart failure exacerbation and bilateral pleural effusions Plan of care as stated above  3 acute right lower extremity limb ischemia Consult vascular surgery for expert opinion, continue aspirin/Eliquis for now, statin therapy, check right lower extremity arterial Doppler with ABI  4 acute on chronic right foot ulcerations on first/second toe Podiatry consulted for expert opinion Continue local wound care  5 history of chronic A. Fib Stable Continue Eliquis, Lopressor  6 chronic benign essential hypertension Stable Continue Lopressor, vitals per routine, make changes as per necessary  7 history of non-Hodgkin's lymphoma Status post bone marrow transplant with chemo and radiation We will need to follow-up  with oncology status post discharge for continued medical management/care      All the records are reviewed and case discussed with ED provider. Management plans discussed with the patient, family and they are in agreement.  CODE STATUS:full Code Status History    Date Active Date Inactive Code Status Order ID Comments User Context   12/15/2017 1316 12/16/2017 1624 DNR [id number]  Hillary Bow, MD Inpatient   12/15/2017 0119 12/15/2017 1316 Full Code [id number]  Lance Coon, MD ED   10/15/2017 1744 10/17/2017 2034 Full Code [id number]  Nicholes Mango, MD ED   10/10/2017 2011 10/12/2017 1834 Full Code [id number]  Demetrios Loll, MD Inpatient   06/18/2016 1253 06/18/2016 1833 Full Code [id number]  Algernon Huxley, MD Inpatient    Questions for Most Recent Historical Code Status (Order [id number])    Question Answer Comment   In the event of cardiac or respiratory ARREST Do not call a "code blue"    In the event of cardiac or respiratory ARREST Do not perform Intubation, CPR, defibrillation or ACLS    In the event of cardiac or respiratory ARREST Use medication by any route, position, wound care, and other measures to relive pain and suffering. May use oxygen, suction and manual treatment of airway obstruction as needed for comfort.        TOTAL TIME TAKING CARE OF THIS PATIENT: 45 minutes.    Avel Peace Salary M.D on 02/12/2018   Between 7am to 6pm - Pager - [phone]  After 6pm go to [email or web address] - password [email or web address] [facility]  Office  [phone]  CC: Primary care physician; Marinda Elk, MD   Note: This dictation was prepared with Dragon dictation along with smaller phrase technology. Any transcriptional errors that result from this process are unintentional.

## 2018-01-30 NOTE — Consult Note (Signed)
ORTHOPAEDIC CONSULTATION  REQUESTING PHYSICIAN: Dustin Flock, MD  Chief Complaint: Ulcer/ wound right foot  HPI: Mindy Cortez is a 83 y.o. female who complains of  Ulcer to right great toe and 2nd toe.  Nail removed traumatically last month.  Seen outpt by Dr. Elvina Mattes.  Admitted for other complaints and evaluation requested.  Past Medical History:  Diagnosis Date  . AICD (automatic cardioverter/defibrillator) present    non-functioning due to no active battery in place  . Atrial fibrillation ([place])   . CHF (congestive heart failure) ([place])   . Depression   . 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 HTN (hypertension)   . Hypertension   . Neuropathy   . Nocturia   . Non-Hodgkin lymphoma ([place])    Bone Marrow Transplant with chemo + Rad tx's.  . Tachycardia   . Urge incontinence   . Urinary incontinence    Past Surgical History:  Procedure Laterality Date  . BONE MARROW TRANSPLANT    . COMPLICATED TOTAL HIP PROSTHESIS AND METHYLMETHACRALATE REMOVAL W/O SPACER INSERTION    . PERIPHERAL VASCULAR CATHETERIZATION Right 06/18/2016   Procedure: Lower Extremity Angiography;  Surgeon: Algernon Huxley, MD;  Location: [facility];  Service: Cardiovascular;  Laterality: Right;  . PERIPHERAL VASCULAR CATHETERIZATION  06/18/2016   Procedure: Lower Extremity Intervention;  Surgeon: Algernon Huxley, MD;  Location: [facility];  Service: Cardiovascular;;  . TOTAL HIP ARTHROPLASTY  3244,0102   Social History   Socioeconomic History  . Marital status: Single    Spouse name: Not on file  . Number of children: Not on file  . Years of education: Not on file  . Highest education level: Not on file  Occupational History  . Not on file  Social Needs  . Financial resource strain: Not hard at all  . Food insecurity:    Worry: Never true    Inability: Never true  . Transportation needs:    Medical: No    Non-medical: No  Tobacco Use   . Smoking status: Never Smoker  . Smokeless tobacco: Never Used  Substance and Sexual Activity  . Alcohol use: Yes    Alcohol/week: 0.0 oz    Comment: rarely  . Drug use: No  . Sexual activity: Never  Lifestyle  . Physical activity:    Days per week: 0 days    Minutes per session: 0 min  . Stress: Not at all  Relationships  . Social connections:    Talks on phone: Twice a week    Gets together: Once a week    Attends religious service: Never    Active member of club or organization: No    Attends meetings of clubs or organizations: Never    Relationship status: Widowed  Other Topics Concern  . Not on file  Social History Narrative   Lives at [facility]   Family History  Problem Relation Age of Onset  . Osteosarcoma Father   . Heart disease Father   . Heart disease Mother   . Breast cancer Mother    Allergies  Allergen Reactions  . Penicillin V Potassium Hives    Has patient had a PCN reaction causing immediate rash, facial/tongue/throat swelling, SOB or lightheadedness with hypotension: Unknown Has patient had a PCN reaction causing severe rash involving mucus membranes or skin necrosis: Unknown Has patient had a PCN reaction that required hospitalization: Unknown Has patient had a PCN  reaction occurring within the last 10 years: Unknown If all of the above answers are "NO", then may proceed with Cephalosporin use.   Marland Kitchen Prochlorperazine Nausea And Vomiting    DIZZINESS, DRUNK FEELING.   . Ramipril Other (See Comments)    SEVERE HEADACHE   . Rifampin     Severe stomach irriatates cramping and nausea  . Sulfa Antibiotics     TOLD AS A CHILD, MADE PT NERVOUS, EMOTIONAL, TOLD NOT TO TAKE.  . Cephalexin Rash    RASH, ITCHING.    Prior to Admission medications   Medication Sig Start Date End Date Taking? Authorizing Provider  acetaminophen (TYLENOL) 325 MG tablet Take 650 mg by mouth every 6 (six) hours as needed for moderate pain or headache.    Yes  [provider]  apixaban (ELIQUIS) 2.5 MG TABS tablet Take 2.5 mg by mouth 2 (two) times daily.   Yes [provider]  aspirin 81 MG chewable tablet Chew 81 mg by mouth daily.   Yes [provider]  cholecalciferol (VITAMIN D) 1000 units tablet Take 1,000 Units by mouth daily.   Yes [provider]  donepezil (ARICEPT) 10 MG tablet Take 10 mg by mouth at bedtime.   Yes [provider]  DULoxetine (CYMBALTA) 30 MG capsule Take 3 capsules (90 mg total) by mouth daily. Patient taking differently: Take 30 mg by mouth daily.  10/18/17  Yes Vaughan Basta, MD  DULoxetine (CYMBALTA) 60 MG capsule Take 60 mg by mouth daily. Take with 30 mg dose = 90 mg once daily   Yes [provider]  ferrous sulfate 325 (65 FE) MG tablet Take 325 mg by mouth daily with breakfast.    Yes [provider]  furosemide (LASIX) 20 MG tablet Take 1 tablet (20 mg total) by mouth daily. Take  tablet (10MG ) by mouth daily and 1 tablet (20MG ) as needed for fluid retention Patient taking differently: Take 20 mg by mouth daily.  01/15/18  Yes Lisa Roca, MD  magnesium oxide (MAG-OX) 400 MG tablet Take 400 mg by mouth daily.   Yes [provider]  Melatonin 3 MG TBDP Take 3 mg by mouth at bedtime.   Yes [provider]  metoprolol tartrate (LOPRESSOR) 25 MG tablet Take 0.5 tablets (12.5 mg total) by mouth 2 (two) times daily. Patient taking differently: Take 25 mg by mouth 2 (two) times daily.  10/17/17  Yes Vaughan Basta, MD  Multiple Vitamin (MULTIVITAMIN WITH MINERALS) TABS tablet Take 1 tablet by mouth daily.   Yes [provider]  potassium chloride (K-DUR) 10 MEQ tablet Take 1 tablet (10 mEq total) by mouth daily. 01/15/18  Yes Lisa Roca, MD  vitamin B-12 (CYANOCOBALAMIN) 1000 MCG tablet Take 1,000 mcg by mouth daily.   Yes [provider]   Ct Angio Chest Pe W Or Wo Contrast  Result Date:  02/11/2018 CLINICAL DATA:  Shortness of breath. EXAM: CT ANGIOGRAPHY CHEST WITH CONTRAST TECHNIQUE: Multidetector CT imaging of the chest was performed using the standard protocol during bolus administration of intravenous contrast. Multiplanar CT image reconstructions and MIPs were obtained to evaluate the vascular anatomy. CONTRAST:  77mL OMNIPAQUE IOHEXOL 350 MG/ML SOLN COMPARISON:  Chest radiograph earlier this day. FINDINGS: Cardiovascular: There are no filling defects within the pulmonary arteries to suggest pulmonary embolus. Dilated main pulmonary artery measuring 3.7 cm consistent with pulmonary arterial hypertension. Atherosclerotic calcifications of the thoracic aorta. Multi chamber cardiomegaly. Contrast refluxing into the hepatic veins and IVC. There  are coronary artery calcifications. Mediastinum/Nodes: No enlarged mediastinal or hilar lymph nodes. The esophagus is decompressed. Lungs/Pleura: Moderate to large right and moderate left pleural effusion. Septal thickening and ground-glass opacities throughout both lungs consistent with pulmonary edema. Compressive atelectasis adjacent to pleural effusions. Trachea and mainstem bronchi are patent. Upper Abdomen: Contrast refluxing into the IVC. No other acute finding. Musculoskeletal: There are no acute or suspicious osseous abnormalities. Review of the MIP images confirms the above findings. IMPRESSION: 1. No pulmonary embolus. 2. Congestive heart failure with cardiomegaly, pulmonary edema and moderate to large pleural effusions. 3. Dilatation of main pulmonary artery consistent with pulmonary arterial hypertension. 4. Aortic Atherosclerosis (ICD10-I70.0). Coronary artery calcifications. Electronically Signed   By: Jeb Levering M.D.   On: 01/25/2018 04:55   Dg Chest Portable 1 View  Result Date: 01/27/2018 CLINICAL DATA:  Shortness of breath. EXAM: PORTABLE CHEST 1 VIEW COMPARISON:  Radiograph 01/15/2018 FINDINGS: Cardiomegaly again seen.  Progressive perihilar pulmonary edema. Increased bilateral pleural effusions. No pneumothorax. IMPRESSION: Progressive CHF with worsening pulmonary edema and development of pleural effusions from 01/15/2018 chest radiograph. Electronically Signed   By: Jeb Levering M.D.   On: 02/08/2018 04:15    Positive ROS: All other systems have been reviewed and were otherwise negative with the exception of those mentioned in the HPI and as above.  12 point ROS was performed.  Physical Exam: General: Alert and oriented.  No apparent distress.  Vascular:  Left foot:Dorsalis Pedis:  absent Posterior Tibial:  absent  Right foot: Dorsalis Pedis:  absent Posterior Tibial:  absent  Neuro:absent  Derm:Nail removed on right great toe.  Nail bed healthy and granular.  Small ~1cm ulcer tor right 2nd toe.  No infection   Ortho/MS: no motion to right leg 2ndary to hip issue.   Assessment: Superficial wound right great toe and 2nd toe. PVD  Plan: Pt seen by vascular last week.  Has been scheduled for angio outpt per vascular note.  Wounds are superficial for now.  Cover with non-adherent dressing and antibiotic ointment.  F/U out pt prn.    Elesa Hacker, DPM Cell [phone]   02/16/2018 12:29 PM

## 2018-01-30 NOTE — Procedures (Signed)
Interventional Radiology Procedure Note  Procedure: US guided right thoracentesis  Complications: None  Estimated Blood Loss: None  Findings: Right thoracentesis yielding 1.2 L of clear, yellow fluid.  Venetia Night. Kathlene Cote, M.D Pager:  [phone]

## 2018-01-30 NOTE — ED Provider Notes (Signed)
[facility] Emergency Department Provider Note  ____________________________________________   First MD Initiated Contact with Patient 01/24/2018 0402     (approximate)  I have reviewed the triage vital signs and the nursing notes.   HISTORY  Chief Complaint No chief complaint on file.    HPI Mindy Cortez is a 83 y.o. female comes to the emergency department via EMS with shortness of breath.  She has a complex past medical history including congestive heart failure as well as non-Hodgkin's lymphoma.  She has no history of COPD.  She does not use oxygen at home.  Her symptoms have been gradual onset over the past several days but tonight became acutely worse.  When EMS arrived they noted that she was saturating in the mid to low 80s on room air.  She came up to the high 90s on nasal cannula.  They gave her one breathing treatment with no improvement.  She denies fevers or chills.  She reports mild cough.  Past Medical History:  Diagnosis Date  . AICD (automatic cardioverter/defibrillator) present    non-functioning due to no active battery in place  . Atrial fibrillation ([place])   . CHF (congestive heart failure) ([place])   . Depression   . 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 HTN (hypertension)   . Hypertension   . Neuropathy   . Nocturia   . Non-Hodgkin lymphoma ([place])    Bone Marrow Transplant with chemo + Rad tx's.  . Tachycardia   . Urge incontinence   . Urinary incontinence     Patient Active Problem List   Diagnosis Date Noted  . CHF (congestive heart failure) ([place]) 02/08/2018  . Acute on chronic systolic CHF (congestive heart failure) ([place]) 12/14/2017  . AF (paroxysmal atrial fibrillation) ([place]) 12/14/2017  . MRSA bacteremia 10/16/2017  . Septic shock ([place])   . Right lower quadrant abdominal pain   . Pressure injury of skin 10/15/2017  . Sepsis ([place]) 10/10/2017  . Atherosclerosis of native  arteries of the extremities with ulceration ([place]) 12/11/2016  . Acquired absence of right hip 04/08/2015  . Bilateral cataracts 04/08/2015  . CCF (congestive cardiac failure) ([place]) 04/08/2015  . Clinical depression 04/08/2015  . Glaucoma 04/08/2015  . HTN (hypertension) 04/08/2015  . Neuropathy 04/08/2015  . Insomnia, persistent 03/16/2015  . Mixed Alzheimer's and vascular dementia 03/16/2015  . Atrial fibrillation with rapid ventricular response ([place]) 02/08/2015  . B12 deficiency 08/02/2014  . Leg pain, right 06/09/2014  . Leg numbness 06/09/2014  . Endomyocardial disease ([place]) 11/13/2006  . Cardiac failure left ([place]) 11/13/2006  . Lymphoma, non-Hodgkin's ([place]) 11/13/2006  . Infection and inflammatory reaction due to internal prosthetic device, implant, and graft 11/13/2006  . Infection of breast implant ([place]) 11/13/2006  . Lymphoma of extranodal and solid organ sites ([place]) 11/13/2006  . Cardiomyopathy ([place]) 11/13/2006    Past Surgical History:  Procedure Laterality Date  . BONE MARROW TRANSPLANT    . COMPLICATED TOTAL HIP PROSTHESIS AND METHYLMETHACRALATE REMOVAL W/O SPACER INSERTION    . PERIPHERAL VASCULAR CATHETERIZATION Right 06/18/2016   Procedure: Lower Extremity Angiography;  Surgeon: Algernon Huxley, MD;  Location: [place] [facility];  Service: Cardiovascular;  Laterality: Right;  . PERIPHERAL VASCULAR CATHETERIZATION  06/18/2016   Procedure: Lower Extremity Intervention;  Surgeon: Algernon Huxley, MD;  Location: [place] [facility];  Service: Cardiovascular;;  . TOTAL HIP ARTHROPLASTY  [id number],7412    Prior to  Admission medications   Medication Sig Start Date End Date Taking? Authorizing Provider  acetaminophen (TYLENOL) 325 MG tablet Take 650 mg by mouth every 6 (six) hours as needed for moderate pain or headache.    Yes [provider]  apixaban (ELIQUIS) 2.5 MG TABS tablet Take 2.5 mg by mouth 2 (two) times daily.   Yes [provider]  aspirin 81 MG  chewable tablet Chew 81 mg by mouth daily.   Yes [provider]  cholecalciferol (VITAMIN D) 1000 units tablet Take 1,000 Units by mouth daily.   Yes [provider]  donepezil (ARICEPT) 10 MG tablet Take 10 mg by mouth at bedtime.   Yes [provider]  DULoxetine (CYMBALTA) 30 MG capsule Take 3 capsules (90 mg total) by mouth daily. Patient taking differently: Take 30 mg by mouth daily.  10/18/17  Yes Vaughan Basta, MD  DULoxetine (CYMBALTA) 60 MG capsule Take 60 mg by mouth daily. Take with 30 mg dose = 90 mg once daily   Yes [provider]  ferrous sulfate 325 (65 FE) MG tablet Take 325 mg by mouth daily with breakfast.    Yes [provider]  furosemide (LASIX) 20 MG tablet Take 1 tablet (20 mg total) by mouth daily. Take  tablet (10MG ) by mouth daily and 1 tablet (20MG ) as needed for fluid retention Patient taking differently: Take 20 mg by mouth daily.  01/15/18  Yes Lisa Roca, MD  magnesium oxide (MAG-OX) 400 MG tablet Take 400 mg by mouth daily.   Yes [provider]  Melatonin 3 MG TBDP Take 3 mg by mouth at bedtime.   Yes [provider]  metoprolol tartrate (LOPRESSOR) 25 MG tablet Take 0.5 tablets (12.5 mg total) by mouth 2 (two) times daily. Patient taking differently: Take 25 mg by mouth 2 (two) times daily.  10/17/17  Yes Vaughan Basta, MD  Multiple Vitamin (MULTIVITAMIN WITH MINERALS) TABS tablet Take 1 tablet by mouth daily.   Yes [provider]  potassium chloride (K-DUR) 10 MEQ tablet Take 1 tablet (10 mEq total) by mouth daily. 01/15/18  Yes Lisa Roca, MD  vitamin B-12 (CYANOCOBALAMIN) 1000 MCG tablet Take 1,000 mcg by mouth daily.   Yes [provider]    Allergies Penicillin v potassium; Prochlorperazine; Ramipril; Rifampin; Sulfa antibiotics; and Cephalexin  Family History  Problem Relation Age of Onset  . Osteosarcoma Father   . Heart disease Father   . Heart  disease Mother   . Breast cancer Mother     Social History Social History   Tobacco Use  . Smoking status: Never Smoker  . Smokeless tobacco: Never Used  Substance Use Topics  . Alcohol use: Yes    Alcohol/week: 0.0 oz    Comment: rarely  . Drug use: No    Review of Systems Constitutional: No fever/chills Eyes: No visual changes. ENT: No sore throat. Cardiovascular: Positive for chest pain. Respiratory: Positive for shortness of breath. Gastrointestinal: No abdominal pain.  No nausea, no vomiting.  No diarrhea.  No constipation. Genitourinary: Negative for dysuria. Musculoskeletal: Negative for back pain. Skin: Negative for rash. Neurological: Negative for headaches, focal weakness or numbness.   ____________________________________________   PHYSICAL EXAM:  VITAL SIGNS: ED Triage Vitals  Enc Vitals Group     BP      Pulse      Resp      Temp      Temp src      SpO2  Weight      Height      Head Circumference      Peak Flow      Pain Score      Pain Loc      Pain Edu?      Excl. in [place]?     Constitutional: Alert and oriented x4 appears short of breath with mild accessory muscle use Eyes: PERRL EOMI. Head: Atraumatic. Nose: No congestion/rhinnorhea. Mouth/Throat: No trismus Neck: No stridor.   Cardiovascular: Tachycardic rate, regular rhythm. Grossly normal heart sounds.  Good peripheral circulation. Respiratory increased respiratory effort with rales midway up bilaterally Gastrointestinal: Soft nontender Musculoskeletal: Right lower extremity slightly cool but biphasic dorsalis pedis pulse and 3-second capillary refill   Neurologic:  Normal speech and language. No gross focal neurologic deficits are appreciated. Skin:  Skin is warm, dry and intact. No rash noted. Psychiatric: Mood and affect are normal. Speech and behavior are normal.    ____________________________________________   DIFFERENTIAL includes but not limited to  Pulmonary  embolism, COPD, pneumonia, pneumothorax, influenza, pulmonary edema ____________________________________________   LABS (all labs ordered are listed, but only abnormal results are displayed)  Labs Reviewed  MRSA PCR SCREENING  TROPONIN I  TSH  TROPONIN I  TROPONIN I    Lab work reviewed by me with significantly elevated BNP concerning for acute pulmonary edema __________________________________________  EKG  ED ECG REPORT I, Darel Hong, the attending physician, personally viewed and interpreted this ECG.  Date: 02/09/2018 EKG Time:  Rate: 93 Rhythm: normal sinus rhythm QRS Axis: normal Intervals: normal ST/T Wave abnormalities: normal Narrative Interpretation: no evidence of acute ischemia  ____________________________________________  RADIOLOGY  CT angiogram reviewed by me with no clot but is suggestive of acute pulmonary edema with right greater than left pleural effusions ____________________________________________   PROCEDURES  Procedure(s) performed: no  Procedures  Critical Care performed: no  Observation: no ____________________________________________   INITIAL IMPRESSION / ASSESSMENT AND PLAN / ED COURSE  Pertinent labs & imaging results that were available during my care of the patient were reviewed by me and considered in my medical decision making (see chart for details).  The patient arrives short of breath although saturating well on nasal cannula.  Her lungs sounds are coarse.  Differential is broad but includes infectious etiology versus pulmonary edema.  Chest x-ray and broad labs are pending as well as CC angiogram given her history of malignancy.  The patient's chest x-ray and CT angiogram are concerning mostly for pulmonary edema.  At this point given her oxygen requirement she does require inpatient admission for IV diuresis as well as IR evaluation for possible thoracentesis for her large effusion on the right.  I discussed with the  patient who verbalized understanding and agreement the plan.  I then discussed with the hospitalist Dr. Jerelyn Charles who has graciously agreed to admit the patient to his service.      ____________________________________________   FINAL CLINICAL IMPRESSION(S) / ED DIAGNOSES  Final diagnoses:  Acute pulmonary edema ([place])      NEW MEDICATIONS STARTED DURING THIS VISIT:  Current Discharge Medication List       Note:  This document was prepared using Dragon voice recognition software and may include unintentional dictation errors.     Darel Hong, MD 02/13/2018 [phone]

## 2018-01-30 NOTE — ED Notes (Signed)
Patient transported to 32

## 2018-01-31 ENCOUNTER — Inpatient Hospital Stay: Admission: RE | Admit: 2018-01-31 | Payer: Medicare Other | Source: Ambulatory Visit

## 2018-01-31 ENCOUNTER — Inpatient Hospital Stay: Payer: Medicare Other

## 2018-01-31 ENCOUNTER — Inpatient Hospital Stay (HOSPITAL_COMMUNITY)
Admission: EM | Admit: 2018-01-31 | Discharge: 2018-01-31 | Disposition: A | Discharge status: Home / Self Care | Payer: Medicare Other | Source: Home / Self Care | Attending: Family Medicine | Admitting: Family Medicine

## 2018-01-31 DIAGNOSIS — I340 Nonrheumatic mitral (valve) insufficiency: Secondary | ICD-10-CM

## 2018-01-31 DIAGNOSIS — I998 Other disorder of circulatory system: Secondary | ICD-10-CM

## 2018-01-31 DIAGNOSIS — N179 Acute kidney failure, unspecified: Secondary | ICD-10-CM

## 2018-01-31 DIAGNOSIS — J9601 Acute respiratory failure with hypoxia: Secondary | ICD-10-CM

## 2018-01-31 DIAGNOSIS — I5023 Acute on chronic systolic (congestive) heart failure: Secondary | ICD-10-CM

## 2018-01-31 DIAGNOSIS — I361 Nonrheumatic tricuspid (valve) insufficiency: Secondary | ICD-10-CM

## 2018-01-31 LAB — ECHOCARDIOGRAM COMPLETE: Weight: 2067.03 oz

## 2018-01-31 LAB — BLOOD GAS, ARTERIAL
ACID-BASE DEFICIT: 11.2 mmol/L — AB (ref 0.0–2.0)
BICARBONATE: 12.1 mmol/L — AB (ref 20.0–28.0)
FIO2: 0.32
O2 SAT: 87.2 %
PATIENT TEMPERATURE: 37
pCO2 arterial: 21 mmHg — ABNORMAL LOW (ref 32.0–48.0)
pH, Arterial: 7.37 (ref 7.350–7.450)
pO2, Arterial: 55 mmHg — ABNORMAL LOW (ref 83.0–108.0)

## 2018-01-31 LAB — HEPATIC FUNCTION PANEL
ALT: 19 U/L (ref 14–54)
AST: 29 U/L (ref 15–41)
Albumin: 2.6 g/dL — ABNORMAL LOW (ref 3.5–5.0)
Alkaline Phosphatase: 119 U/L (ref 38–126)
BILIRUBIN TOTAL: 0.8 mg/dL (ref 0.3–1.2)
Bilirubin, Direct: 0.2 mg/dL (ref 0.1–0.5)
Indirect Bilirubin: 0.6 mg/dL (ref 0.3–0.9)
Total Protein: 8 g/dL (ref 6.5–8.1)

## 2018-01-31 LAB — CBC WITH DIFFERENTIAL/PLATELET
BASOS ABS: 0 10*3/uL (ref 0–0.1)
BASOS PCT: 0 %
EOS ABS: 0 10*3/uL (ref 0–0.7)
EOS PCT: 0 %
HEMATOCRIT: 37.9 % (ref 35.0–47.0)
Hemoglobin: 11.6 g/dL — ABNORMAL LOW (ref 12.0–16.0)
Lymphocytes Relative: 7 %
Lymphs Abs: 0.6 10*3/uL — ABNORMAL LOW (ref 1.0–3.6)
MCH: 26.2 pg (ref 26.0–34.0)
MCHC: 30.7 g/dL — AB (ref 32.0–36.0)
MCV: 85.4 fL (ref 80.0–100.0)
MONO ABS: 1.9 10*3/uL — AB (ref 0.2–0.9)
MONOS PCT: 20 %
Neutro Abs: 7.2 10*3/uL — ABNORMAL HIGH (ref 1.4–6.5)
Neutrophils Relative %: 73 %
PLATELETS: 325 10*3/uL (ref 150–440)
RBC: 4.44 MIL/uL (ref 3.80–5.20)
RDW: 25.1 % — AB (ref 11.5–14.5)
WBC: 9.7 10*3/uL (ref 3.6–11.0)

## 2018-01-31 LAB — BASIC METABOLIC PANEL
Anion gap: 19 — ABNORMAL HIGH (ref 5–15)
BUN: 23 mg/dL — AB (ref 6–20)
CHLORIDE: 100 mmol/L — AB (ref 101–111)
CO2: 16 mmol/L — AB (ref 22–32)
CREATININE: 1.46 mg/dL — AB (ref 0.44–1.00)
Calcium: 8.1 mg/dL — ABNORMAL LOW (ref 8.9–10.3)
GFR calc non Af Amer: 33 mL/min — ABNORMAL LOW (ref >60)
GFR, EST AFRICAN AMERICAN: 38 mL/min — AB (ref >60)
Glucose, Bld: 155 mg/dL — ABNORMAL HIGH (ref 65–99)
Potassium: 3.9 mmol/L (ref 3.5–5.1)
Sodium: 135 mmol/L (ref 135–145)

## 2018-01-31 LAB — PATHOLOGIST SMEAR REVIEW

## 2018-01-31 MED ORDER — LORAZEPAM 2 MG/ML IJ SOLN
1.0000 mg | INTRAMUSCULAR | Status: DC | PRN
  Administered 2018-01-31: 1 mg via INTRAVENOUS

## 2018-01-31 MED ORDER — DIGOXIN 0.1 MG/ML IJ SOLN
250.0000 ug | Freq: Once | INTRAMUSCULAR | Status: DC
  Filled 2018-01-31: qty 3

## 2018-01-31 MED ORDER — MORPHINE 100MG IN NS 100ML (1MG/ML) PREMIX INFUSION
INTRAVENOUS | Status: AC
  Administered 2018-01-31: 5 mg/h via INTRAVENOUS
  Filled 2018-01-31: qty 100

## 2018-01-31 MED ORDER — DIGOXIN 0.25 MG/ML IJ SOLN
0.2500 mg | Freq: Once | INTRAMUSCULAR | Status: AC
  Administered 2018-01-31: 0.25 mg via INTRAVENOUS
  Filled 2018-01-31: qty 1

## 2018-01-31 MED ORDER — MORPHINE SULFATE (PF) 2 MG/ML IV SOLN
2.0000 mg | INTRAVENOUS | Status: DC | PRN
  Administered 2018-01-31 (×2): 2 mg via INTRAVENOUS
  Filled 2018-01-31: qty 1

## 2018-01-31 MED ORDER — LORAZEPAM 2 MG/ML IJ SOLN
INTRAMUSCULAR | Status: AC
  Administered 2018-01-31: 1 mg via INTRAVENOUS
  Filled 2018-01-31: qty 1

## 2018-01-31 MED ORDER — GLYCOPYRROLATE 0.2 MG/ML IJ SOLN
0.1000 mg | Freq: Once | INTRAMUSCULAR | Status: AC
  Administered 2018-01-31: 0.1 mg via INTRAVENOUS

## 2018-01-31 MED ORDER — SCOPOLAMINE 1 MG/3DAYS TD PT72
1.0000 | MEDICATED_PATCH | TRANSDERMAL | Status: DC
  Filled 2018-01-31: qty 1

## 2018-01-31 MED ORDER — FUROSEMIDE 10 MG/ML IJ SOLN
INTRAMUSCULAR | Status: AC
  Administered 2018-01-31: 40 mg via INTRAVENOUS
  Filled 2018-01-31: qty 4

## 2018-01-31 MED ORDER — MORPHINE 100MG IN NS 100ML (1MG/ML) PREMIX INFUSION
1.0000 mg/h | INTRAVENOUS | Status: DC
  Administered 2018-01-31: 5 mg/h via INTRAVENOUS

## 2018-01-31 MED ORDER — MORPHINE SULFATE (PF) 2 MG/ML IV SOLN
INTRAVENOUS | Status: AC
  Administered 2018-01-31: 2 mg via INTRAVENOUS
  Filled 2018-01-31: qty 1

## 2018-01-31 MED ORDER — MORPHINE SULFATE (PF) 2 MG/ML IV SOLN
2.0000 mg | Freq: Once | INTRAVENOUS | Status: AC
Start: 2018-01-31 — End: 2018-01-31
  Administered 2018-01-31: 2 mg via INTRAVENOUS

## 2018-01-31 MED ORDER — SODIUM CHLORIDE 0.9 % IV SOLN
1.0000 g | Freq: Three times a day (TID) | INTRAVENOUS | Status: DC
  Filled 2018-01-31 (×2): qty 1

## 2018-01-31 MED ORDER — FUROSEMIDE 10 MG/ML IJ SOLN
40.0000 mg | Freq: Once | INTRAMUSCULAR | Status: AC
  Administered 2018-01-31: 40 mg via INTRAVENOUS

## 2018-01-31 MED ORDER — FUROSEMIDE 10 MG/ML IJ SOLN
8.0000 mg/h | INTRAVENOUS | Status: DC
  Administered 2018-01-31: 8 mg/h via INTRAVENOUS
  Filled 2018-01-31: qty 25

## 2018-02-02 LAB — BODY FLUID CULTURE: Culture: NO GROWTH

## 2018-02-03 ENCOUNTER — Ambulatory Visit: Admission: RE | Admit: 2018-02-03 | Payer: Medicare Other | Source: Ambulatory Visit | Admitting: Vascular Surgery

## 2018-02-03 ENCOUNTER — Other Ambulatory Visit (INDEPENDENT_AMBULATORY_CARE_PROVIDER_SITE_OTHER): Payer: Self-pay | Admitting: Vascular Surgery

## 2018-02-03 LAB — COMP PANEL: LEUKEMIA/LYMPHOMA

## 2018-02-03 SURGERY — LOWER EXTREMITY ANGIOGRAPHY
Anesthesia: Moderate Sedation | Laterality: Right

## 2018-02-06 ENCOUNTER — Telehealth: Payer: Self-pay | Admitting: Internal Medicine

## 2018-02-06 NOTE — Telephone Encounter (Signed)
Death cert placed in DK's folder to be signed. 

## 2018-02-06 NOTE — Telephone Encounter (Signed)
Recieved Death Certificate from [place] on [place], [place] desk.

## 2018-02-07 ENCOUNTER — Inpatient Hospital Stay: Admission: RE | Admit: 2018-02-07 | Payer: Medicare Other | Source: Ambulatory Visit

## 2018-02-07 NOTE — Telephone Encounter (Signed)
Informed Fredirick Maudlin that death cert is ready for pick up and has been faxed per their request.

## 2018-02-10 ENCOUNTER — Ambulatory Visit: Admit: 2018-02-10 | Payer: Medicare Other | Admitting: Vascular Surgery

## 2018-02-10 SURGERY — LOWER EXTREMITY ANGIOGRAPHY
Anesthesia: Moderate Sedation | Laterality: Left

## 2018-02-11 ENCOUNTER — Encounter (INDEPENDENT_AMBULATORY_CARE_PROVIDER_SITE_OTHER): Payer: Medicare Other

## 2018-02-11 ENCOUNTER — Ambulatory Visit (INDEPENDENT_AMBULATORY_CARE_PROVIDER_SITE_OTHER): Payer: Medicare Other | Admitting: Vascular Surgery

## 2018-02-17 ENCOUNTER — Ambulatory Visit: Payer: Medicare Other | Admitting: Family

## 2018-02-19 NOTE — Progress Notes (Signed)
While lab was taking blood, pt oxygen stats dropped to 80% on 3L. Oxygen bumped to 5L. Respiratory was paged and pt was 82% on 5 L. MD paged. Pt was placed on Nonrebreather. Pt very hard to arouse, MD paged back, MD stated he would call family. Rapid Response called. CCU Charge Staci at bedside and stated transfer to ICU 4. Pt transferred to ICU 4. MD to call family. Report given to [employer].

## 2018-02-19 NOTE — Progress Notes (Signed)
02/11/2018 1255  Clinical Encounter Type  Visited With Family  Visit Type Follow-up  Spiritual Encounters  Spiritual Needs Emotional   Based on morning report, chaplain went to check in with patient and family.  Patient granddaughter indicated that her mom was in the waiting room and would walk with chaplain so they could meet.  Chaplain met with patient daughter, daughter-in-law, grandchildren, and daughter's mother-in-law.  Chaplain spoke of chaplain service availability and encouraged family to contact chaplain as needed.

## 2018-02-19 NOTE — Death Summary Note (Signed)
DEATH SUMMARY   Patient Details  Name: Mindy Cortez MRN: [id number] DOB: December 11, 1934  Admission/Discharge Information   Admit Date:  Feb 20, 2018  Date of Death: Date of Death: 21-Feb-2018  Time of Death: Time of Death: 1600  Length of Stay: 1  Referring Physician: Marinda Elk, MD   Reason(s) for Hospitalization  end stage systolic chf  Diagnoses  Preliminary cause of death:  Secondary Diagnoses (including complications and co-morbidities):  Active Problems:   CHF (congestive heart failure) (HCC)   Malnutrition of moderate degree Acute Respiratory failure secondary to worsening systolic dysfunction CHF  Acute kidney Injury secondary to hypoperfusion Right lower extremity Limb Ischemia Hx of valvular heart disease with moderate MR and poor LVF 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 year old female with moderate MR and known EF of 30% was admitted with acute on chronic systolic dysfunction CHF, pleural effusion and ischemic right lower extremity and ulcers of 1st and 2nd toes.  She was transferred to the ICU because of hypoxia. Patient was treated with Morphine sulphate and placed on BIPAP. She was given IV lasix and continued on Lasix drip. Beta blocker was discontinued as patient had worsening renal failure. She was started on Aztreonam ABX and Palliative care was consulted.  The patient's family changed the code status to comfort care and she was pronounced at 1600. Family was at bedside.   Pertinent Labs and Studies  Significant Diagnostic Studies Dg Chest 1 View  Result Date: 02-20-18 CLINICAL DATA:  Right-sided thoracentesis. EXAM: CHEST  1 VIEW COMPARISON:  CT 02/20/18.  Chest x-ray Feb 20, 2018. FINDINGS: Mediastinum hilar structures are normal. Cardiomegaly with bilateral  interstitial prominence, left side greater than right. Findings suggest interstitial edema. Findings have improved from prior exams. Small residual left pleural effusion. Previously identified large right pleural effusion has resolved. No pneumothorax post thoracentesis. IMPRESSION: 1.  No pneumothorax post thoracentesis. 2.  Findings consistent with resolving CHF. Electronically Signed   By: Marcello Moores  Register   On: 02-20-2018 12:34   Ct Angio Chest Pe W Or Wo Contrast  Result Date: 02/20/18 CLINICAL DATA:  Shortness of breath. EXAM: CT ANGIOGRAPHY CHEST WITH CONTRAST TECHNIQUE: Multidetector CT imaging of the chest was performed using the standard protocol during bolus administration of intravenous contrast. Multiplanar CT image reconstructions and MIPs were obtained to evaluate the vascular anatomy. CONTRAST:  35mL OMNIPAQUE IOHEXOL 350 MG/ML SOLN COMPARISON:  Chest radiograph earlier this day. FINDINGS: Cardiovascular: There are no filling defects within the pulmonary arteries to suggest pulmonary embolus. Dilated main pulmonary artery measuring 3.7 cm consistent with pulmonary arterial hypertension. Atherosclerotic calcifications of the thoracic aorta. Multi chamber cardiomegaly. Contrast refluxing into the hepatic veins and IVC. There are coronary artery calcifications. Mediastinum/Nodes: No enlarged mediastinal or hilar lymph nodes. The esophagus is decompressed. Lungs/Pleura: Moderate to large right and moderate left pleural effusion. Septal thickening and ground-glass opacities throughout both lungs consistent with pulmonary edema. Compressive atelectasis adjacent to pleural effusions. Trachea and mainstem bronchi are patent. Upper Abdomen: Contrast refluxing into the IVC. No other acute finding. Musculoskeletal: There are no acute or suspicious osseous abnormalities. Review of the MIP images confirms the above findings. IMPRESSION: 1. No pulmonary embolus. 2. Congestive heart failure with cardiomegaly,  pulmonary edema and moderate to large pleural effusions. 3. Dilatation of main pulmonary artery consistent with pulmonary  arterial hypertension. 4. Aortic Atherosclerosis (ICD10-I70.0). Coronary artery calcifications. Electronically Signed   By: Jeb Levering M.D.   On: 01/24/2018 04:55   Dg Chest Port 1 View  Result Date: 07-Feb-2018 CLINICAL DATA:  Shortness of Breath EXAM: PORTABLE CHEST 1 VIEW COMPARISON:  02/04/2018 FINDINGS: Cardiac shadow is enlarged. Increased vascular congestion and patchy infiltrative changes are noted consistent with worsening CHF. No pneumothorax is noted. No bony abnormality is seen. IMPRESSION: Worsening CHF. Electronically Signed   By: Inez Catalina M.D.   On: 02/07/18 08:30   Dg Chest Portable 1 View  Result Date: 02/13/2018 CLINICAL DATA:  Shortness of breath. EXAM: PORTABLE CHEST 1 VIEW COMPARISON:  Radiograph 01/15/2018 FINDINGS: Cardiomegaly again seen. Progressive perihilar pulmonary edema. Increased bilateral pleural effusions. No pneumothorax. IMPRESSION: Progressive CHF with worsening pulmonary edema and development of pleural effusions from 01/15/2018 chest radiograph. Electronically Signed   By: Jeb Levering M.D.   On: 02/03/2018 04:15   Dg Abdomen Acute W/chest  Result Date: 01/15/2018 CLINICAL DATA:  Left flank pain, no dysuria or urinary frequency. EXAM: DG ABDOMEN ACUTE W/ 1V CHEST COMPARISON:  Chest x-ray of December 16, 2017 FINDINGS: The lungs are adequately inflated. The interstitial markings are increased. The cardiac silhouette is enlarged and the pulmonary vascularity engorged and less distinct. There is a trace of pleural fluid bilaterally. Within the abdomen the bowel gas pattern is normal. No definite urinary tract stones are observed. There are severe degenerative changes of the right hip. IMPRESSION: No calcified urinary tract stones are observed. No acute intra-abdominal abnormality. Chronic bronchitic changes with superimposed CHF with  moderate interstitial edema. Electronically Signed   By: David  Martinique M.D.   On: 01/15/2018 10:01   US Thoracentesis Asp Pleural Space W/img Guide  Result Date: 02/15/2018 CLINICAL DATA:  Congestive heart failure and bilateral pleural effusions, right greater than left. EXAM: ULTRASOUND GUIDED RIGHT THORACENTESIS COMPARISON:  None. PROCEDURE: An ultrasound guided thoracentesis was thoroughly discussed with the patient and questions answered. The benefits, risks, alternatives and complications were also discussed. The patient understands and wishes to proceed with the procedure. Written consent was obtained. Ultrasound was performed to localize and mark an adequate pocket of fluid in the right chest. The area was then prepped and draped in the normal sterile fashion. 1% Lidocaine was used for local anesthesia. Under ultrasound guidance a 6 French Safe-T-Centesis catheter was introduced. Thoracentesis was performed. The catheter was removed and a dressing applied. COMPLICATIONS: None FINDINGS: A total of approximately 1.2 L of clear, yellow fluid was removed. IMPRESSION: Successful ultrasound guided right thoracentesis yielding 1.2 L of pleural fluid. Electronically Signed   By: Aletta Edouard M.D.   On: 01/29/2018 14:10    Microbiology Recent Results (from the past 240 hour(s))  MRSA PCR Screening     Status: None   Collection Time: 02/04/2018  6:53 AM  Result Value Ref Range Status   MRSA by PCR NEGATIVE NEGATIVE Final    Comment:        The GeneXpert MRSA Assay (FDA approved for NASAL specimens only), is one component of a comprehensive MRSA colonization surveillance program. It is not intended to diagnose MRSA infection nor to guide or monitor treatment for MRSA infections. Performed at [facility], [address], [place], [place] [address]   Body fluid culture     Status: None (Preliminary result)   Collection Time: 01/29/2018 12:09 PM  Result Value Ref Range Status    Specimen Description   Final  PLEURAL Performed at [facility], [address] [place], [place] [address]    Special Requests   Final    NONE Performed at [facility], [place], [place] [address]    Gram Stain   Final    MODERATE WBC PRESENT,BOTH PMN AND MONONUCLEAR NO ORGANISMS SEEN    Culture   Final    NO GROWTH < 24 HOURS Performed at [facility], [place] [address], [place],  [address]    Report Status PENDING  Incomplete    Lab Basic Metabolic Panel: Recent Labs  Lab 02/05/2018 0224 11-Feb-2018 0904  NA 136 135  K 3.6 3.9  CL 104 100*  CO2 22 16*  GLUCOSE 157* 155*  BUN 16 23*  CREATININE 0.98 1.46*  CALCIUM 8.0* 8.1*   Liver Function Tests: Recent Labs  Lab 02/15/2018 0224 11-Feb-2018 0950  AST 28 29  ALT 14 19  ALKPHOS 107 119  BILITOT 0.8 0.8  PROT 7.2 8.0  ALBUMIN 2.3* 2.6*   No results for input(s): LIPASE, AMYLASE in the last 168 hours. No results for input(s): AMMONIA in the last 168 hours. CBC: Recent Labs  Lab 01/23/2018 0224 Feb 11, 2018 0950  WBC 10.3 9.7  NEUTROABS 7.8* 7.2*  HGB 10.3* 11.6*  HCT 33.2* 37.9  MCV 84.2 85.4  PLT 366 325   Cardiac Enzymes: Recent Labs  Lab 02/07/2018 0654 02/07/2018 1330 01/21/2018 1904  TROPONINI <0.03 <0.03 <0.03   Sepsis Labs: Recent Labs  Lab 02/05/2018 0224 2018/02/11 0950  WBC 10.3 9.7    Procedures/Operations  none   [place] Dragon 02/11/2018, 7:37 PM

## 2018-02-19 NOTE — Progress Notes (Signed)
PT Cancellation Note  Patient Details Name: Mindy Cortez MRN: [id number] DOB: [birth year]   Cancelled Treatment:    Reason Eval/Treat Not Completed: Patient not medically ready Pt with rapid response called, O2 sats had dropped to low 80s on 3 then 5 then non-rebreather on transfer to CCU.  Will complete PT orders, will need new orders when/if she is appropriate.  Kreg Shropshire, DPT Feb 19, 2018, 10:41 AM

## 2018-02-19 NOTE — Progress Notes (Signed)
While in morning report a Rapid Response was called. Chaplain walked over from [place] . Pt had been moved to ICU-4. Chaplain went to ICU. Pt was being attended and family had been called. Chaplain asked charge to call if needed.    02-09-18 0900  Clinical Encounter Type  Visited With Patient  Visit Type Initial  Referral From Nurse  Spiritual Encounters  Spiritual Needs Emotional

## 2018-02-19 NOTE — Progress Notes (Signed)
MD Posey Pronto was notified of pt tachypeanic and sinus tachy, very anxious. Order was placed for stat chest xray. No further orders at this time.

## 2018-02-19 NOTE — Progress Notes (Signed)
[place] at [facility]                                                                                                                                                                                  Patient Demographics   Mindy Cortez, is a 83 y.o. female, DOB - [birth year], [place] date - 01/28/2018   Admitting Physician Gorden Harms, MD  Outpatient Primary MD for the patient is Marinda Elk, MD   LOS - 1  Subjective: Called by nurse for worsening respiratory status patient now on nonrebreather   Review of Systems:   CONSTITUTIONAL: No documented fever. No fatigue, weakness. No weight gain, no weight loss.  EYES: No blurry or double vision.  ENT: No tinnitus. No postnasal drip. No redness of the oropharynx.  RESPIRATORY: No cough, no wheeze, no hemoptysis.  Positive dyspnea.  CARDIOVASCULAR: No chest pain. No orthopnea. No palpitations. No syncope.  GASTROINTESTINAL: No nausea, no vomiting or diarrhea. No abdominal pain. No melena or hematochezia.  GENITOURINARY: No dysuria or hematuria.  ENDOCRINE: No polyuria or nocturia. No heat or cold intolerance.  HEMATOLOGY: No anemia. No bruising. No bleeding.  INTEGUMENTARY: No rashes.  Chronic right lower extremity ulcers MUSCULOSKELETAL: No arthritis. No swelling. No gout.  NEUROLOGIC: No numbness, tingling, or ataxia. No seizure-type activity.  PSYCHIATRIC: No anxiety. No insomnia. No ADD.    Vitals:   Vitals:   01/21/2018 1947 02-02-18 0349 02-02-2018 0721 Feb 02, 2018 0909  BP: 114/84 (!) 123/91 (!) 133/95 (!) 146/93  Pulse: (!) 101 (!) 105 (!) 115 (!) 112  Resp:  20 (!) 32   Temp: (!) 97.5 F (36.4 C) 98.2 F (36.8 C) 98.5 F (36.9 C)   TempSrc: Oral Oral Oral   SpO2: 97% 99% 96%   Weight:  58.6 kg (129 lb 3 oz)      Wt Readings from Last 3 Encounters:  2018-02-02 58.6 kg (129 lb 3 oz)  01/22/18 59 kg (130 lb)  01/15/18 54.4 kg (120 lb)     Intake/Output Summary (Last 24  hours) at 2018-02-02 0931 Last data filed at 02-02-18 0456 Gross per 24 hour  Intake -  Output 1100 ml  Net -1100 ml    Physical Exam:   GENERAL: Critically ill  HEAD, EYES, EARS, NOSE AND THROAT: Atraumatic, normocephalic. Extraocular muscles are intact. Pupils equal and reactive to light. Sclerae anicteric. No conjunctival injection. No oro-pharyngeal erythema.  NECK: Supple. There is no jugular venous distention. No bruits, no lymphadenopathy, no thyromegaly.  HEART: Regular rate and rhythm,. No murmurs, no rubs, no clicks.  LUNGS: Crackles and rhonchus breath sounds throughout both lungs ABDOMEN: Soft,  flat, nontender, nondistended. Has good bowel sounds. No hepatosplenomegaly appreciated.  EXTREMITIES: No evidence of any cyanosis, clubbing, or peripheral edema.  Right foot cool to touch with diminished pulses NEUROLOGIC: The patient is alert, awake, and oriented x3 with no focal motor or sensory deficits appreciated bilaterally.  SKIN: Moist and warm with no rashes appreciated.  Psych: Not anxious, depressed LN: No inguinal LN enlargement    Antibiotics   Anti-infectives (From admission, onward)   None      Medications   Scheduled Meds: . acidophilus  1 capsule Oral Daily  . apixaban  2.5 mg Oral BID  . aspirin EC  81 mg Oral Daily  . atorvastatin  20 mg Oral Daily  . bacitracin   Topical Daily  . cholecalciferol  1,000 Units Oral Daily  . digoxin  0.25 mg Intravenous Once  . donepezil  10 mg Oral QHS  . DULoxetine  90 mg Oral Daily  . ferrous sulfate  325 mg Oral Q breakfast  . magnesium oxide  400 mg Oral Daily  . Melatonin  5 mg Oral QHS  . metoprolol tartrate  12.5 mg Oral BID  . multivitamin with minerals  1 tablet Oral Daily  . multivitamin-lutein  1 capsule Oral Daily  . potassium chloride  10 mEq Oral Daily  . sodium chloride flush  3 mL Intravenous Q12H  . vitamin B-12  1,000 mcg Oral Daily   Continuous Infusions: . sodium chloride    . furosemide  (LASIX) infusion 8 mg/hr (2018-02-18 0907)   PRN Meds:.sodium chloride, acetaminophen, ALPRAZolam, diphenhydrAMINE, loperamide, ondansetron (ZOFRAN) IV, sodium chloride flush   Data Review:   Micro Results Recent Results (from the past 240 hour(s))  MRSA PCR Screening     Status: None   Collection Time: 02/03/2018  6:53 AM  Result Value Ref Range Status   MRSA by PCR NEGATIVE NEGATIVE Final    Comment:        The GeneXpert MRSA Assay (FDA approved for NASAL specimens only), is one component of a comprehensive MRSA colonization surveillance program. It is not intended to diagnose MRSA infection nor to guide or monitor treatment for MRSA infections. Performed at [facility], [address] [place], [place] [address]   Body fluid culture     Status: None (Preliminary result)   Collection Time: 02/11/2018 12:09 PM  Result Value Ref Range Status   Specimen Description   Final    PLEURAL Performed at [facility], [address] [place], [place] [address]    Special Requests   Final    NONE Performed at [facility], [address] [place], [place] [address]    Gram Stain   Final    MODERATE WBC PRESENT,BOTH PMN AND MONONUCLEAR NO ORGANISMS SEEN Performed at [facility], [place] [address], [place], [place] [address]    Culture PENDING  Incomplete   Report Status PENDING  Incomplete    Radiology Reports Dg Chest 1 View  Result Date: 01/27/2018 CLINICAL DATA:  Right-sided thoracentesis. EXAM: CHEST  1 VIEW COMPARISON:  CT 02/03/2018.  Chest x-ray 02/03/2018. FINDINGS: Mediastinum hilar structures are normal. Cardiomegaly with bilateral interstitial prominence, left side greater than right. Findings suggest interstitial edema. Findings have improved from prior exams. Small residual left pleural effusion. Previously identified large right pleural effusion has resolved. No pneumothorax post thoracentesis. IMPRESSION: 1.  No pneumothorax post  thoracentesis. 2.  Findings consistent with resolving CHF. Electronically Signed   By: Marcello Moores  Register   On: 01/23/2018 12:34   Ct Angio Chest Pe W Or Wo Contrast  Result Date: 02/03/2018 CLINICAL DATA:  Shortness of breath. EXAM: CT ANGIOGRAPHY CHEST WITH CONTRAST TECHNIQUE: Multidetector CT imaging of the chest was performed using the standard protocol during bolus administration of intravenous contrast. Multiplanar CT image reconstructions and MIPs were obtained to evaluate the vascular anatomy. CONTRAST:  62mL OMNIPAQUE IOHEXOL 350 MG/ML SOLN COMPARISON:  Chest radiograph earlier this day. FINDINGS: Cardiovascular: There are no filling defects within the pulmonary arteries to suggest pulmonary embolus. Dilated main pulmonary artery measuring 3.7 cm consistent with pulmonary arterial hypertension. Atherosclerotic calcifications of the thoracic aorta. Multi chamber cardiomegaly. Contrast refluxing into the hepatic veins and IVC. There are coronary artery calcifications. Mediastinum/Nodes: No enlarged mediastinal or hilar lymph nodes. The esophagus is decompressed. Lungs/Pleura: Moderate to large right and moderate left pleural effusion. Septal thickening and ground-glass opacities throughout both lungs consistent with pulmonary edema. Compressive atelectasis adjacent to pleural effusions. Trachea and mainstem bronchi are patent. Upper Abdomen: Contrast refluxing into the IVC. No other acute finding. Musculoskeletal: There are no acute or suspicious osseous abnormalities. Review of the MIP images confirms the above findings. IMPRESSION: 1. No pulmonary embolus. 2. Congestive heart failure with cardiomegaly, pulmonary edema and moderate to large pleural effusions. 3. Dilatation of main pulmonary artery consistent with pulmonary arterial hypertension. 4. Aortic Atherosclerosis (ICD10-I70.0). Coronary artery calcifications. Electronically Signed   By: Jeb Levering M.D.   On: 02/09/2018 04:55   Dg Chest Port  1 View  Result Date: 02/12/2018 CLINICAL DATA:  Shortness of Breath EXAM: PORTABLE CHEST 1 VIEW COMPARISON:  01/29/2018 FINDINGS: Cardiac shadow is enlarged. Increased vascular congestion and patchy infiltrative changes are noted consistent with worsening CHF. No pneumothorax is noted. No bony abnormality is seen. IMPRESSION: Worsening CHF. Electronically Signed   By: Inez Catalina M.D.   On: February 12, 2018 08:30   Dg Chest Portable 1 View  Result Date: 02/13/2018 CLINICAL DATA:  Shortness of breath. EXAM: PORTABLE CHEST 1 VIEW COMPARISON:  Radiograph 01/15/2018 FINDINGS: Cardiomegaly again seen. Progressive perihilar pulmonary edema. Increased bilateral pleural effusions. No pneumothorax. IMPRESSION: Progressive CHF with worsening pulmonary edema and development of pleural effusions from 01/15/2018 chest radiograph. Electronically Signed   By: Jeb Levering M.D.   On: 02/10/2018 04:15   Dg Abdomen Acute W/chest  Result Date: 01/15/2018 CLINICAL DATA:  Left flank pain, no dysuria or urinary frequency. EXAM: DG ABDOMEN ACUTE W/ 1V CHEST COMPARISON:  Chest x-ray of December 16, 2017 FINDINGS: The lungs are adequately inflated. The interstitial markings are increased. The cardiac silhouette is enlarged and the pulmonary vascularity engorged and less distinct. There is a trace of pleural fluid bilaterally. Within the abdomen the bowel gas pattern is normal. No definite urinary tract stones are observed. There are severe degenerative changes of the right hip. IMPRESSION: No calcified urinary tract stones are observed. No acute intra-abdominal abnormality. Chronic bronchitic changes with superimposed CHF with moderate interstitial edema. Electronically Signed   By: David  Martinique M.D.   On: 01/15/2018 10:01   US Thoracentesis Asp Pleural Space W/img Guide  Result Date: 01/28/2018 CLINICAL DATA:  Congestive heart failure and bilateral pleural effusions, right greater than left. EXAM: ULTRASOUND GUIDED RIGHT  THORACENTESIS COMPARISON:  None. PROCEDURE: An ultrasound guided thoracentesis was thoroughly discussed with the patient and questions answered. The benefits, risks, alternatives and complications were also discussed. The patient understands and wishes to proceed with the procedure. Written consent was obtained. Ultrasound was performed to  localize and mark an adequate pocket of fluid in the right chest. The area was then prepped and draped in the normal sterile fashion. 1% Lidocaine was used for local anesthesia. Under ultrasound guidance a 6 French Safe-T-Centesis catheter was introduced. Thoracentesis was performed. The catheter was removed and a dressing applied. COMPLICATIONS: None FINDINGS: A total of approximately 1.2 L of clear, yellow fluid was removed. IMPRESSION: Successful ultrasound guided right thoracentesis yielding 1.2 L of pleural fluid. Electronically Signed   By: Aletta Edouard M.D.   On: 02/18/2018 14:10     CBC Recent Labs  Lab 01/29/2018 0224  WBC 10.3  HGB 10.3*  HCT 33.2*  PLT 366  MCV 84.2  MCH 26.0  MCHC 30.9*  RDW 24.7*  LYMPHSABS 1.3  MONOABS 1.0*  EOSABS 0.1  BASOSABS 0.1    Chemistries  Recent Labs  Lab 02/02/2018 0224  NA 136  K 3.6  CL 104  CO2 22  GLUCOSE 157*  BUN 16  CREATININE 0.98  CALCIUM 8.0*  AST 28  ALT 14  ALKPHOS 107  BILITOT 0.8   ------------------------------------------------------------------------------------------------------------------ estimated creatinine clearance is 40.5 mL/min (by C-G formula based on SCr of 0.98 mg/dL). ------------------------------------------------------------------------------------------------------------------ No results for input(s): HGBA1C in the last 72 hours. ------------------------------------------------------------------------------------------------------------------ No results for input(s): CHOL, HDL, LDLCALC, TRIG, CHOLHDL, LDLDIRECT in the last 72  hours. ------------------------------------------------------------------------------------------------------------------ Recent Labs    01/24/2018 0654  TSH 2.211   ------------------------------------------------------------------------------------------------------------------ No results for input(s): VITAMINB12, FOLATE, FERRITIN, TIBC, IRON, RETICCTPCT in the last 72 hours.  Coagulation profile No results for input(s): INR, PROTIME in the last 168 hours.  No results for input(s): DDIMER in the last 72 hours.  Cardiac Enzymes Recent Labs  Lab 01/23/2018 0654 02/09/2018 1330 02/05/2018 1904  TROPONINI <0.03 <0.03 <0.03   ------------------------------------------------------------------------------------------------------------------ Invalid input(s): POCBNP    Assessment & Plan   IMPRESSION AND PLAN: 1.  Acute respiratory failure Chest x-ray reviewed shows severe CHF Started the patient on IV Lasix Placed on BiPAP Transferred to the ICU  2. acute on chronic systolic congestive heart failure exacerbation Continue IV Lasix, Lopressor,  Echo reviewed from cardiology Shows significant systolic dysfunction at 32% severe to moderate MR moderate TR   3 acute right lower extremity limb ischemia Appreciate vascular surgery evaluation, continue aspirin/Eliquis for now, statin therapy,   4 acute on chronic right foot ulcerations on first/second toe Podiatry consulted  Continue local wound care  5 history of chronic A. 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 May use oxygen, suction and manual treatment of airway obstruction as needed for comfort.      02/05/2018 1019  Code Status History    Date Active Date Inactive Code Status Order ID Comments User Context   01/29/2018 0645 01/29/2018 1019 Full Code [id number]  Gorden Harms, MD Inpatient   12/15/2017 1316 12/16/2017 1624 DNR [id number]  Hillary Bow, MD Inpatient   12/15/2017 0119 12/15/2017 1316 Full Code [id number]  Lance Coon, MD ED   10/15/2017 1744 10/17/2017 2034 Full Code [id number]  Nicholes Mango, MD ED   10/10/2017 2011 10/12/2017 1834 Full Code [id number]  Demetrios Loll, MD Inpatient   06/18/2016 1253 06/18/2016 1833 Full Code [id number]  Algernon Huxley, MD Inpatient    Advance Directive Documentation     Most Recent Value  Type of Advance Directive  Healthcare Power of Attorney  Pre-existing out of facility DNR order (yellow form or pink MOST form)  -  "MOST" Form in Place?  -       I discussed the case with ICU attending regarding transfer the patient I also called patient's daughter-in-law because I was unable to reach her daughter and updated on her patient's condition she will relay the message to her daughter I have explained to them the prognosis is very poor and patient may not survive.    Consults podiatry and vascular surgery   DVT Prophylaxis Eliquis  Lab Results  Component Value Date   PLT 366 01/28/2018     Time Spent in minutes 40 additional   critical care time spent  Dustin Flock M.D on 02-Feb-2018 at 9:31 AM  Between 7am to 6pm - Pager - [phone]  After 6pm go to  [email or web address] - Proofreader  [employer]   Office  [phone]

## 2018-02-19 NOTE — Significant Event (Signed)
Rapid Response Event Note  Overview: Time Called: 0905 Arrival Time: 0907 Event Type: Respiratory  Initial Focused Assessment: Patient in bed with increased work of breathing, sats 86 percent on 100% NRB.  Dr Dustin Flock arrived at bedside and ordered to trasnfer to [facility] unit stat for Bipap.    Interventions: 2mg  morphine administered, 40mg  IV lasix given, foley catheter placed, daughter of patient notified of condition by Dr Celesta Aver and was advised to come in from [place] immediately.  Patient placed on Bipap in ICU 4.  Plan of Care (if not transferred):  Event Summary: Name of Physician Notified: Dustin Flock at [phone]  Name of Consulting Physician Notified: Dr Celesta Aver at [phone]  Outcome: Transferred (Comment)(ICU 4)  Event End Time: 1194  Mindy Cortez P

## 2018-02-19 NOTE — Progress Notes (Addendum)
PULMONARY / CRITICAL CARE MEDICINE   Name: Mindy Cortez MRN: [id number] DOB: 1935-01-09    ADMISSION DATE:  01/24/2018 CONSULTATION DATE:  03-Feb-2018  REFERRING MD:  Dustin Flock  CHIEF COMPLAINT:  Shortness of breath  HISTORY OF PRESENT ILLNESS:   Mindy Cortez  is a 83 y.o. female with a known history per below presenting from assisted living facility with shortness of breath acutely, in the emergency room patient was noted to have O2 saturation in the 80s, ER workup noted for BNP greater than 3400, CT chest negative for PE/noted congestive heart failure/bilateral pleural effusions right greater than left/pulmonary hypertension, patient evaluated emergency room, no apparent distress, resting comfortably in bed, patient with right lower leg/foot dressing clean/intact-patient noted to have cold foot with absence of pulses, noted foot cyanosis, superficial dorsal surface ulcerations of the first and second toe, patient is now been admitted for acute on chronic systolic congestive heart failure exacerbation, bilateral pleural effusions right greater than left, and acute right limb ischemia with chronic dorsal surface wounds to first/second toe.  The patient was admitted and treated for CHF with diuretics. She had right thoracentesis of 1.2 liters on 02/09/2018 for treatment of large pleural effusion.  She was also seen by vascular surgical team for her ischemic right toes.  This AM she was transferred to the ICU after a RR because of hypoxia with saturations the low 80's. Upon arrival she was hypoxic, severely short of breath and agitated.  REVIEW OF SYSTEMS:   Unattainable  SUBJECTIVE:  Patient complained of unable to breath  VITAL SIGNS: BP (!) 146/93 (BP Location: Right Arm)   Pulse (!) 112   Temp 98.5 F (36.9 C) (Oral)   Resp (!) 32   Wt 129 lb 3 oz (58.6 kg)   SpO2 96%   BMI 21.50 kg/m   HEMODYNAMICS:  hyoxic  OXYGEN:  placed on BIPAP  INTAKE / OUTPUT: I/O last 3 completed  shifts: In: -  Out: 1500 [Urine:1500]  PHYSICAL EXAMINATION: General:  Acute respiratory distress Neuro:  AAOx2, no motor deficits HEENT:  PERRL, moist mucous membranes Cardiovascular:  Sinus tachycardia Lungs:  Diffuse Rhonchi  Abdomen:  soft Musculoskeletal:  No deformities Skin: right toes especially great toe cold and cynotic  LABS:  BMET Recent Labs  Lab 02/15/2018 0224 02/03/2018 0904  NA 136 135  K 3.6 3.9  CL 104 100*  CO2 22 16*  BUN 16 23*  CREATININE 0.98 1.46*  GLUCOSE 157* 155*    Electrolytes Recent Labs  Lab 02/15/2018 0224 February 03, 2018 0904  CALCIUM 8.0* 8.1*    CBC Recent Labs  Lab 01/26/2018 0224 02/03/18 0950  WBC 10.3 9.7  HGB 10.3* 11.6*  HCT 33.2* 37.9  PLT 366 325    Coag's No results for input(s): APTT, INR in the last 168 hours.  Sepsis Markers No results for input(s): LATICACIDVEN, PROCALCITON, O2SATVEN in the last 168 hours.  ABG Recent Labs  Lab 2018-02-03 0814  PHART 7.37  PCO2ART 21*  PO2ART 55*    Liver Enzymes Recent Labs  Lab 02/15/2018 0224 03-Feb-2018 0950  AST 28 29  ALT 14 19  ALKPHOS 107 119  BILITOT 0.8 0.8  ALBUMIN 2.3* 2.6*    Cardiac Enzymes Recent Labs  Lab 02/11/2018 0654 02/04/2018 1330 02/14/2018 1904  TROPONINI <0.03 <0.03 <0.03    Glucose No results for input(s): GLUCAP in the last 168 hours.  Imaging Dg Chest Port 1 View  Result Date: February 03, 2018 CLINICAL DATA:  Shortness of Breath  EXAM: PORTABLE CHEST 1 VIEW COMPARISON:  02/04/2018 FINDINGS: Cardiac shadow is enlarged. Increased vascular congestion and patchy infiltrative changes are noted consistent with worsening CHF. No pneumothorax is noted. No bony abnormality is seen. IMPRESSION: Worsening CHF. Electronically Signed   By: Inez Catalina M.D.   On: 02-08-18 08:30     STUDIES:  4/12 2 D ECHO- results pending  CULTURES: MRSA PCR -negative Pleural fluid- negative  ANTIBIOTICS: none  SIGNIFICANT EVENTS: 4/11 Admit, thoracentesis right   4/12 RR, transfer to ICU  LINES/TUBES: Peripheral IV  DISCUSSION: This 83 year old lady with moderate MR and known EF of 30% was admitted with acute on chronic systolic dysfunction CHF, pleural effusion and ischemic right lower extremity and ulcers of 1st and 2nd toes.  She was transferred to the ICU because of hypoxia.  ASSESSMENT / PLAN:  1. Acute Respiratory failure secondary to worsening systolic dysfunction CHF 2.  Acute kidney Injury secondary to hypoperfusion 3. Right lower extremity Limb Ischemia 4. Hx of valvular heart disease with moderate MR and poor LVF 5. Chronic ulcers of right 1st and 2nd toes 6. Hx of chronic Atrial Fibrillation  Plans: 1. Patient treated with Morphine sulphate and placed on BIPAP 2. Patient give IV lasix and continued on Lasix drip 3. Avoid Hypotension 4. D/C beta blocker as patient has worsening renal failure 5. Will add Aztreonam ABX 6. Palliative care consult  FAMILY  - Updates: Daughter and daughter-in-laws updated via phone and in person Disp: patient is DNR/DNI. Family considering compassionate care of hospice.   I have dedicated a total of 40 minutes in critical care time minus all appropriate exclusions.  Patient's family have changed code status to comfort care.  Cammie Sickle, MD Pulmonary and [place] Pager: [phone]  February 08, 2018, 12:25 PM

## 2018-02-19 NOTE — Progress Notes (Signed)
[place] at [facility]                                                                                                                                                                                  Patient Demographics   Mindy Cortez, is a 83 y.o. female, DOB - 31-Mar-1935, WUJ:[id number]  Admit date - 02/12/2018   Admitting Physician Gorden Harms, MD  Outpatient Primary MD for the patient is Marinda Elk, MD   LOS - 1  Subjective: Called by nurse due to patient having worsening respiratory distress Patient states that she is more short of breath  Review of Systems:   CONSTITUTIONAL: No documented fever. No fatigue, weakness. No weight gain, no weight loss.  EYES: No blurry or double vision.  ENT: No tinnitus. No postnasal drip. No redness of the oropharynx.  RESPIRATORY: No cough, no wheeze, no hemoptysis.  Positive dyspnea.  CARDIOVASCULAR: No chest pain. No orthopnea. No palpitations. No syncope.  GASTROINTESTINAL: No nausea, no vomiting or diarrhea. No abdominal pain. No melena or hematochezia.  GENITOURINARY: No dysuria or hematuria.  ENDOCRINE: No polyuria or nocturia. No heat or cold intolerance.  HEMATOLOGY: No anemia. No bruising. No bleeding.  INTEGUMENTARY: No rashes.  Chronic right lower extremity ulcers MUSCULOSKELETAL: No arthritis. No swelling. No gout.  NEUROLOGIC: No numbness, tingling, or ataxia. No seizure-type activity.  PSYCHIATRIC: No anxiety. No insomnia. No ADD.    Vitals:   Vitals:   01/29/2018 1947 February 16, 2018 0349 2018-02-16 0721 2018/02/16 0909  BP: 114/84 (!) 123/91 (!) 133/95 (!) 146/93  Pulse: (!) 101 (!) 105 (!) 115 (!) 112  Resp:  20 (!) 32   Temp: (!) 97.5 F (36.4 C) 98.2 F (36.8 C) 98.5 F (36.9 C)   TempSrc: Oral Oral Oral   SpO2: 97% 99% 96%   Weight:  58.6 kg (129 lb 3 oz)      Wt Readings from Last 3 Encounters:  2018-02-16 58.6 kg (129 lb 3 oz)  01/22/18 59 kg (130 lb)  01/15/18 54.4 kg (120 lb)      Intake/Output Summary (Last 24 hours) at 2018-02-16 0927 Last data filed at February 16, 2018 0456 Gross per 24 hour  Intake -  Output 1100 ml  Net -1100 ml    Physical Exam:   GENERAL: Tachypneic.  HEAD, EYES, EARS, NOSE AND THROAT: Atraumatic, normocephalic. Extraocular muscles are intact. Pupils equal and reactive to light. Sclerae anicteric. No conjunctival injection. No oro-pharyngeal erythema.  NECK: Supple. There is no jugular venous distention. No bruits, no lymphadenopathy, no thyromegaly.  HEART: Regular rate and rhythm,. No murmurs, no rubs, no clicks.  LUNGS: Crackles and rhonchus breath  sounds throughout both lungs ABDOMEN: Soft, flat, nontender, nondistended. Has good bowel sounds. No hepatosplenomegaly appreciated.  EXTREMITIES: No evidence of any cyanosis, clubbing, or peripheral edema.  Right foot cool to touch with diminished pulses NEUROLOGIC: The patient is alert, awake, and oriented x3 with no focal motor or sensory deficits appreciated bilaterally.  SKIN: Moist and warm with no rashes appreciated.  Psych: Not anxious, depressed LN: No inguinal LN enlargement    Antibiotics   Anti-infectives (From admission, onward)   None      Medications   Scheduled Meds: . acidophilus  1 capsule Oral Daily  . apixaban  2.5 mg Oral BID  . aspirin EC  81 mg Oral Daily  . atorvastatin  20 mg Oral Daily  . bacitracin   Topical Daily  . cholecalciferol  1,000 Units Oral Daily  . digoxin  0.25 mg Intravenous Once  . donepezil  10 mg Oral QHS  . DULoxetine  90 mg Oral Daily  . ferrous sulfate  325 mg Oral Q breakfast  . magnesium oxide  400 mg Oral Daily  . Melatonin  5 mg Oral QHS  . metoprolol tartrate  12.5 mg Oral BID  . multivitamin with minerals  1 tablet Oral Daily  . multivitamin-lutein  1 capsule Oral Daily  . potassium chloride  10 mEq Oral Daily  . sodium chloride flush  3 mL Intravenous Q12H  . vitamin B-12  1,000 mcg Oral Daily   Continuous  Infusions: . sodium chloride    . furosemide (LASIX) infusion 8 mg/hr (2018/02/23 0907)   PRN Meds:.sodium chloride, acetaminophen, ALPRAZolam, diphenhydrAMINE, loperamide, ondansetron (ZOFRAN) IV, sodium chloride flush   Data Review:   Micro Results Recent Results (from the past 240 hour(s))  MRSA PCR Screening     Status: None   Collection Time: 01/20/2018  6:53 AM  Result Value Ref Range Status   MRSA by PCR NEGATIVE NEGATIVE Final    Comment:        The GeneXpert MRSA Assay (FDA approved for NASAL specimens only), is one component of a comprehensive MRSA colonization surveillance program. It is not intended to diagnose MRSA infection nor to guide or monitor treatment for MRSA infections. Performed at [facility], [address] [place], [place] [address]   Body fluid culture     Status: None (Preliminary result)   Collection Time: 01/23/2018 12:09 PM  Result Value Ref Range Status   Specimen Description   Final    PLEURAL Performed at [facility], [address], [place], [place] [address]    Special Requests   Final    NONE Performed at [facility] [address] [place], [place] [address]    Gram Stain   Final    MODERATE WBC PRESENT,BOTH PMN AND MONONUCLEAR NO ORGANISMS SEEN Performed at [facility], [address], [place], [place] [address]    Culture PENDING  Incomplete   Report Status PENDING  Incomplete    Radiology Reports Dg Chest 1 View  Result Date: 02/18/2018 CLINICAL DATA:  Right-sided thoracentesis. EXAM: CHEST  1 VIEW COMPARISON:  CT 02/16/2018.  Chest x-ray 02/17/2018. FINDINGS: Mediastinum hilar structures are normal. Cardiomegaly with bilateral interstitial prominence, left side greater than right. Findings suggest interstitial edema. Findings have improved from prior exams. Small residual left pleural effusion. Previously identified large right pleural effusion has resolved. No pneumothorax post  thoracentesis. IMPRESSION: 1.  No pneumothorax post thoracentesis. 2.  Findings consistent with resolving CHF. Electronically  Signed   ByMarcello Moores  Register   On: 02/12/2018 12:34   Ct Angio Chest Pe W Or Wo Contrast  Result Date: 01/21/2018 CLINICAL DATA:  Shortness of breath. EXAM: CT ANGIOGRAPHY CHEST WITH CONTRAST TECHNIQUE: Multidetector CT imaging of the chest was performed using the standard protocol during bolus administration of intravenous contrast. Multiplanar CT image reconstructions and MIPs were obtained to evaluate the vascular anatomy. CONTRAST:  27mL OMNIPAQUE IOHEXOL 350 MG/ML SOLN COMPARISON:  Chest radiograph earlier this day. FINDINGS: Cardiovascular: There are no filling defects within the pulmonary arteries to suggest pulmonary embolus. Dilated main pulmonary artery measuring 3.7 cm consistent with pulmonary arterial hypertension. Atherosclerotic calcifications of the thoracic aorta. Multi chamber cardiomegaly. Contrast refluxing into the hepatic veins and IVC. There are coronary artery calcifications. Mediastinum/Nodes: No enlarged mediastinal or hilar lymph nodes. The esophagus is decompressed. Lungs/Pleura: Moderate to large right and moderate left pleural effusion. Septal thickening and ground-glass opacities throughout both lungs consistent with pulmonary edema. Compressive atelectasis adjacent to pleural effusions. Trachea and mainstem bronchi are patent. Upper Abdomen: Contrast refluxing into the IVC. No other acute finding. Musculoskeletal: There are no acute or suspicious osseous abnormalities. Review of the MIP images confirms the above findings. IMPRESSION: 1. No pulmonary embolus. 2. Congestive heart failure with cardiomegaly, pulmonary edema and moderate to large pleural effusions. 3. Dilatation of main pulmonary artery consistent with pulmonary arterial hypertension. 4. Aortic Atherosclerosis (ICD10-I70.0). Coronary artery calcifications. Electronically Signed   By: Jeb Levering M.D.   On: 01/23/2018 04:55   Dg Chest Port 1 View  Result Date: 02/22/18 CLINICAL DATA:  Shortness of Breath EXAM: PORTABLE CHEST 1 VIEW COMPARISON:  02/10/2018 FINDINGS: Cardiac shadow is enlarged. Increased vascular congestion and patchy infiltrative changes are noted consistent with worsening CHF. No pneumothorax is noted. No bony abnormality is seen. IMPRESSION: Worsening CHF. Electronically Signed   By: Inez Catalina M.D.   On: 22-Feb-2018 08:30   Dg Chest Portable 1 View  Result Date: 02/05/2018 CLINICAL DATA:  Shortness of breath. EXAM: PORTABLE CHEST 1 VIEW COMPARISON:  Radiograph 01/15/2018 FINDINGS: Cardiomegaly again seen. Progressive perihilar pulmonary edema. Increased bilateral pleural effusions. No pneumothorax. IMPRESSION: Progressive CHF with worsening pulmonary edema and development of pleural effusions from 01/15/2018 chest radiograph. Electronically Signed   By: Jeb Levering M.D.   On: 01/23/2018 04:15   Dg Abdomen Acute W/chest  Result Date: 01/15/2018 CLINICAL DATA:  Left flank pain, no dysuria or urinary frequency. EXAM: DG ABDOMEN ACUTE W/ 1V CHEST COMPARISON:  Chest x-ray of December 16, 2017 FINDINGS: The lungs are adequately inflated. The interstitial markings are increased. The cardiac silhouette is enlarged and the pulmonary vascularity engorged and less distinct. There is a trace of pleural fluid bilaterally. Within the abdomen the bowel gas pattern is normal. No definite urinary tract stones are observed. There are severe degenerative changes of the right hip. IMPRESSION: No calcified urinary tract stones are observed. No acute intra-abdominal abnormality. Chronic bronchitic changes with superimposed CHF with moderate interstitial edema. Electronically Signed   By: David  Martinique M.D.   On: 01/15/2018 10:01   US Thoracentesis Asp Pleural Space W/img Guide  Result Date: 01/22/2018 CLINICAL DATA:  Congestive heart failure and bilateral pleural effusions, right  greater than left. EXAM: ULTRASOUND GUIDED RIGHT THORACENTESIS COMPARISON:  None. PROCEDURE: An ultrasound guided thoracentesis was thoroughly discussed with the patient and questions answered. The benefits, risks, alternatives and complications were also discussed. The patient understands and wishes to proceed with the procedure. Written consent  was obtained. Ultrasound was performed to localize and mark an adequate pocket of fluid in the right chest. The area was then prepped and draped in the normal sterile fashion. 1% Lidocaine was used for local anesthesia. Under ultrasound guidance a 6 French Safe-T-Centesis catheter was introduced. Thoracentesis was performed. The catheter was removed and a dressing applied. COMPLICATIONS: None FINDINGS: A total of approximately 1.2 L of clear, yellow fluid was removed. IMPRESSION: Successful ultrasound guided right thoracentesis yielding 1.2 L of pleural fluid. Electronically Signed   By: Aletta Edouard M.D.   On: 01/25/2018 14:10     CBC Recent Labs  Lab 02/16/2018 0224  WBC 10.3  HGB 10.3*  HCT 33.2*  PLT 366  MCV 84.2  MCH 26.0  MCHC 30.9*  RDW 24.7*  LYMPHSABS 1.3  MONOABS 1.0*  EOSABS 0.1  BASOSABS 0.1    Chemistries  Recent Labs  Lab 02/09/2018 0224  NA 136  K 3.6  CL 104  CO2 22  GLUCOSE 157*  BUN 16  CREATININE 0.98  CALCIUM 8.0*  AST 28  ALT 14  ALKPHOS 107  BILITOT 0.8   ------------------------------------------------------------------------------------------------------------------ estimated creatinine clearance is 40.5 mL/min (by C-G formula based on SCr of 0.98 mg/dL). ------------------------------------------------------------------------------------------------------------------ No results for input(s): HGBA1C in the last 72 hours. ------------------------------------------------------------------------------------------------------------------ No results for input(s): CHOL, HDL, LDLCALC, TRIG, CHOLHDL, LDLDIRECT in  the last 72 hours. ------------------------------------------------------------------------------------------------------------------ Recent Labs    01/22/2018 0654  TSH 2.211   ------------------------------------------------------------------------------------------------------------------ No results for input(s): VITAMINB12, FOLATE, FERRITIN, TIBC, IRON, RETICCTPCT in the last 72 hours.  Coagulation profile No results for input(s): INR, PROTIME in the last 168 hours.  No results for input(s): DDIMER in the last 72 hours.  Cardiac Enzymes Recent Labs  Lab 02/14/2018 0654 02/15/2018 1330 01/22/2018 1904  TROPONINI <0.03 <0.03 <0.03   ------------------------------------------------------------------------------------------------------------------ Invalid input(s): POCBNP    Assessment & Plan   IMPRESSION AND PLAN: 1.  Acute respiratory failure Stat ABG patient has not put out much with the morning dose of Lasix We will start patient on IV Lasix drip Stat chest x-ray    2. acute on chronic systolic congestive heart failure exacerbation Continue IV Lasix, Lopressor,  Echo reviewed from cardiology Shows significant systolic dysfunction at 63% severe to moderate MR moderate TR    3 acute right lower extremity limb ischemia Appreciate vascular surgery evaluation, continue aspirin/Eliquis for now, statin therapy,   4 acute on chronic right foot ulcerations on first/second toe Podiatry consulted  Continue local wound care  5 history of chronic A. 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 May use oxygen, suction and manual treatment of airway  obstruction as needed for comfort.      02/01/2018 1019    Code Status History    Date Active Date Inactive Code Status Order ID Comments User Context   02/03/2018 0645 02/14/2018 1019 Full Code [id number]  Gorden Harms, MD Inpatient   12/15/2017 1316 12/16/2017 1624 DNR [id number]  Hillary Bow, MD Inpatient   12/15/2017 0119 12/15/2017 1316 Full Code [id number]  Lance Coon, MD ED   10/15/2017 1744 10/17/2017 2034 Full Code [id number]  Nicholes Mango, MD ED   10/10/2017 2011 10/12/2017 1834 Full Code [id number]  Demetrios Loll, MD Inpatient   06/18/2016 1253 06/18/2016 1833 Full Code [id number]  Algernon Huxley, MD Inpatient    Advance Directive Documentation     Most Recent Value  Type of Advance Directive  Healthcare Power of Attorney  Pre-existing out of facility DNR order (yellow form or pink MOST form)  -  "MOST" Form in Place?  -           Consults podiatry and vascular surgery   DVT Prophylaxis Eliquis  Lab Results  Component Value Date   PLT 366 02/18/2018     Time Spent in minutes 45 minutes critical care time spent  Dustin Flock M.D on 02-14-2018 at 9:27 AM  Between 7am to 6pm - Pager - [phone]  After 6pm go to [email or web address] - Proofreader  [employer]   Office  [phone]

## 2018-02-19 NOTE — Progress Notes (Signed)
*  PRELIMINARY RESULTS* Echocardiogram 2D Echocardiogram has been performed.  Sherrie Sport February 04, 2018, 8:05 AM

## 2018-02-19 NOTE — Progress Notes (Signed)
Pt arrived to unit in resp distress- diaphoretic, RR 30s, coarse crackles, restlessness/agitation SpO2 lower 80s on NRB with refusal to wear bipap. Pt was given 1mg  of morphine for air hunger/restlessness->bipap applied. Pt was easily redirected to wear mask once it was applied.  Agitation/restlessness returned ->SpO2 decreasing to lower 80s on 60% FiO2 bipap-> increased to 70%. Family at bedside working through [facility]. Pt remaining on lasix gtt- UO about 7ml; lung sounds very coarse with new onset confusion. Daughter at bedside with decision to make pt CC->morphine gtt initiated and 1mg  of ativan given for pt comfort.

## 2018-02-19 NOTE — Progress Notes (Signed)
MD Posey Pronto was notified that pt is still SOB and reports empending doom. No urine output from am dose of lasix. Chest xray and ECHO completed.

## 2018-02-19 NOTE — Progress Notes (Signed)
Pt c/o SOB and noted to be tachypneic with crackles in bilateral lung sounds on assessment. SpO2 96-100% on 3L [place]. Given 0800 dose of IV lasix 40mg  early. Nursing staff will continue to monitor for any changes in patient status. Earleen Reaper, RN

## 2018-02-19 NOTE — Progress Notes (Signed)
MD Celesta Aver notified of patient expiration.

## 2018-02-19 DEATH — deceased

## 2019-11-21 IMAGING — CR DG CHEST 2V
2 series · 2 of 2 positions shown · non-contrast
Comparison: 10/16/2017 and prior radiograph

CLINICAL DATA: Acute shortness of breath.

EXAM:
CHEST  2 VIEW

[chest lat]
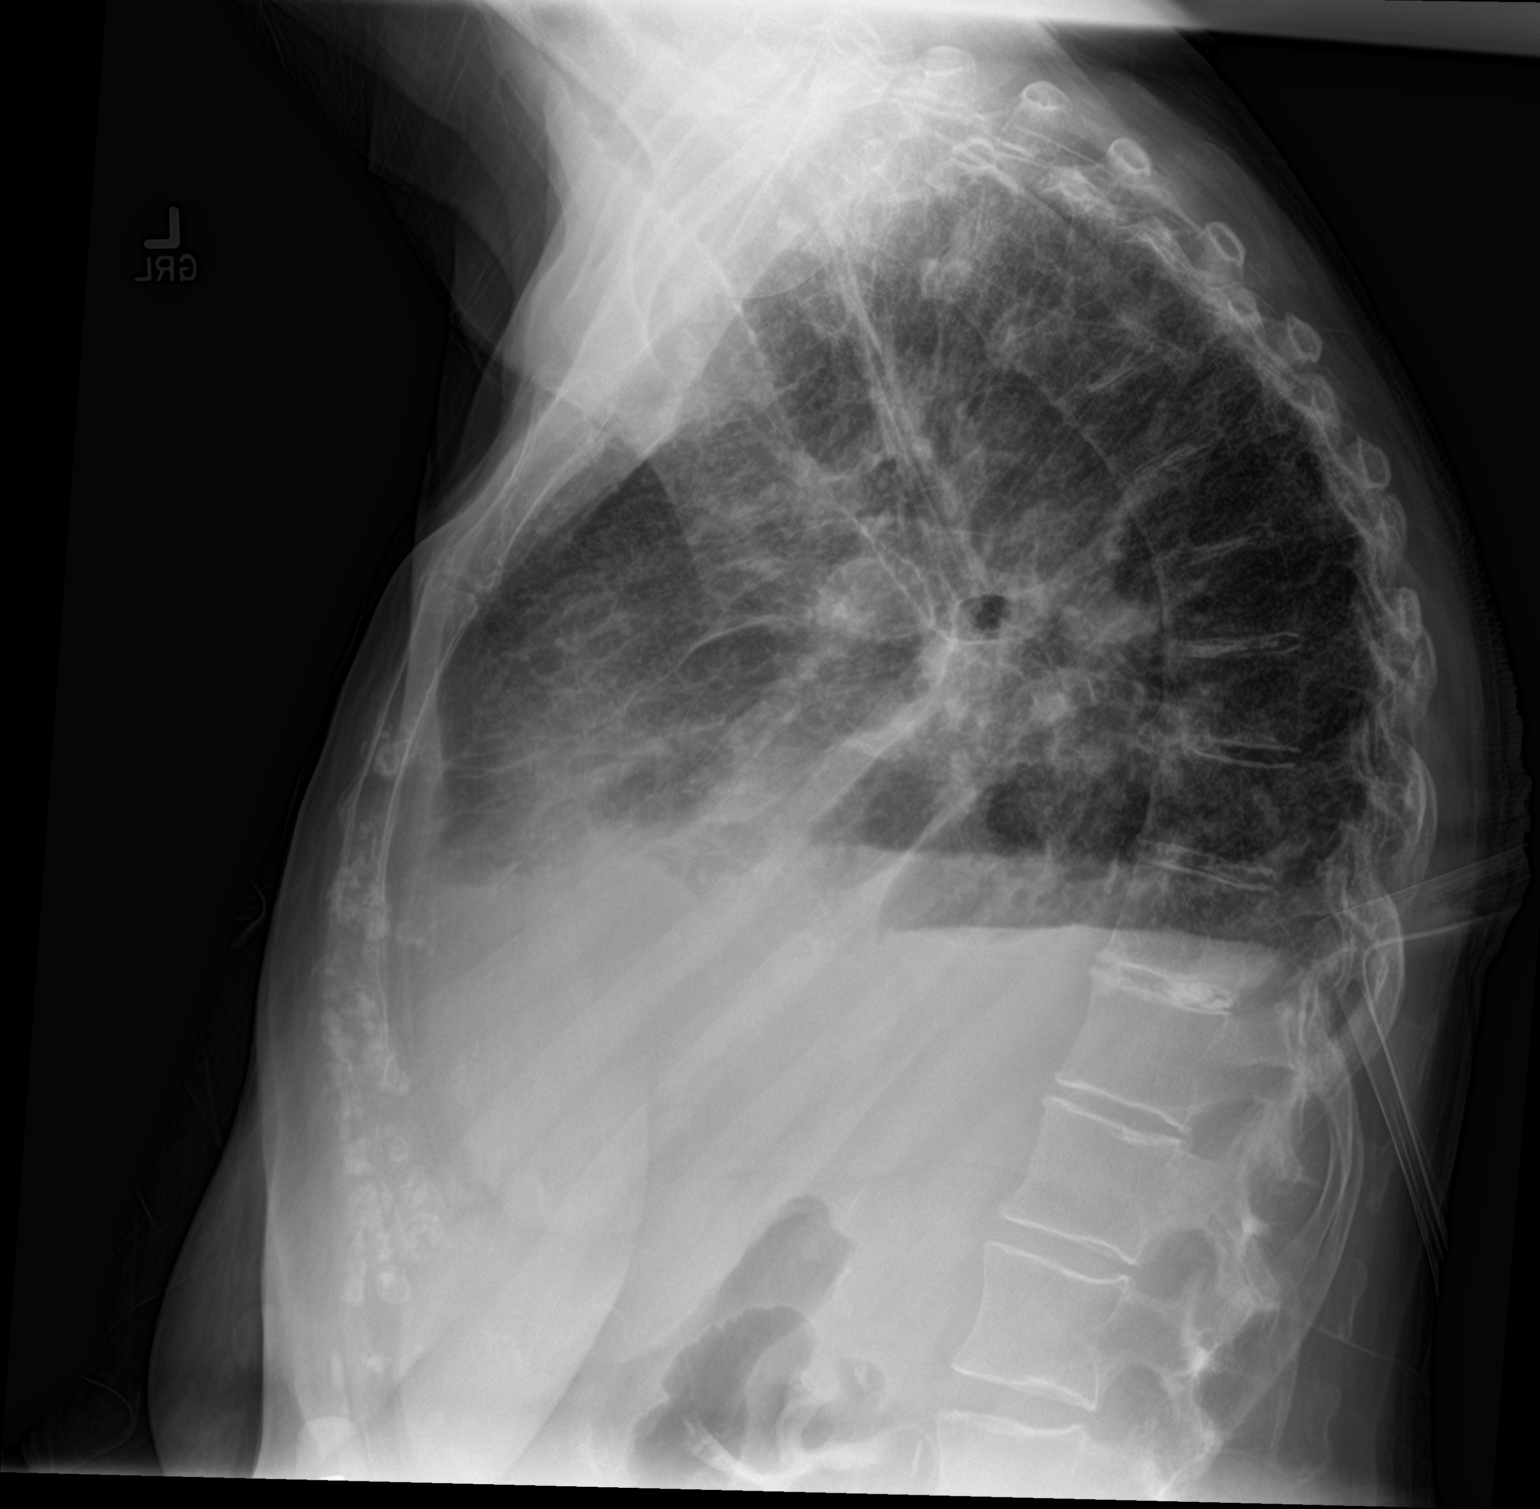

[chest ap]
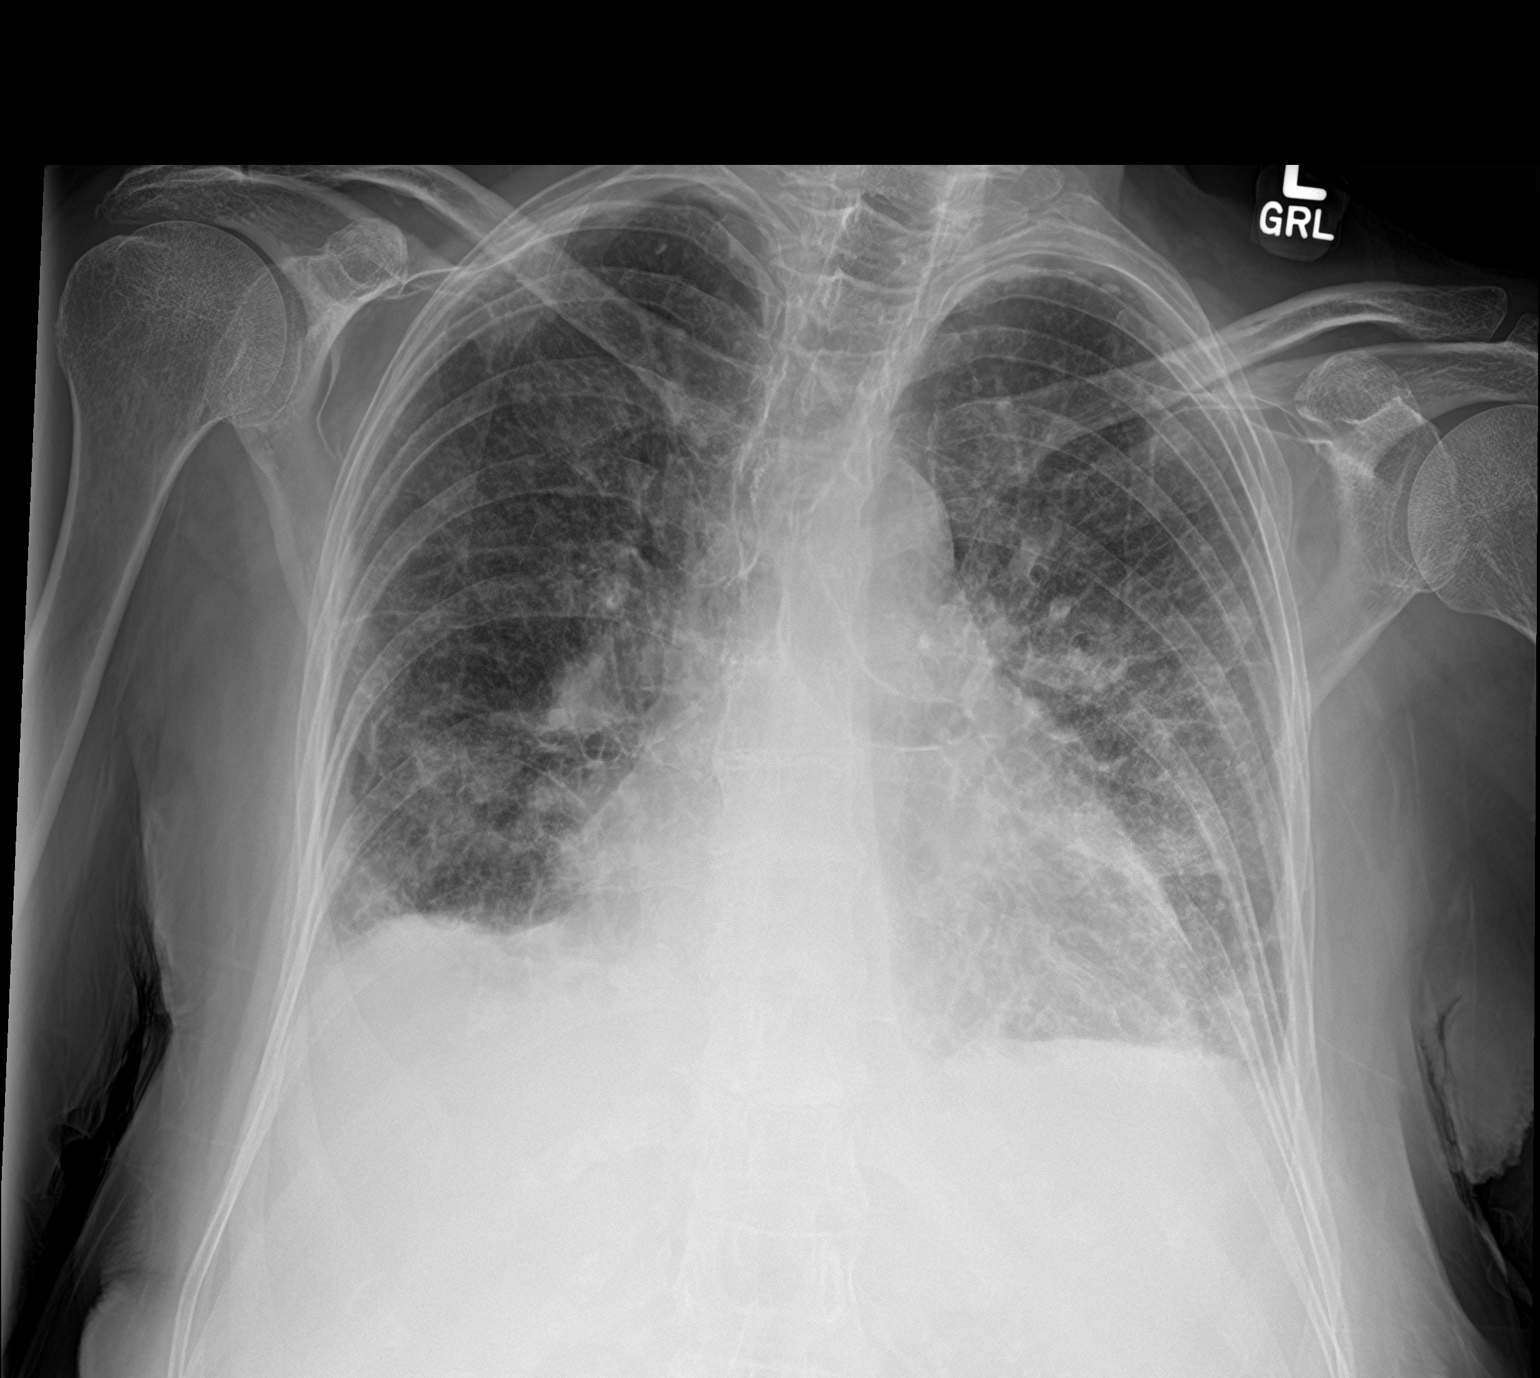

[2 of 2 positions shown; findings below may reference images not displayed]

FINDINGS: Cardiomegaly noted. Mild bilateral interstitial opacities are
identified suggestive of edema.

Bibasilar atelectasis noted.

There is no evidence of pneumothorax.

There may be trace bilateral pleural effusions present.

No acute bony abnormality identified.
IMPRESSION: Cardiomegaly with mild bilateral interstitial opacities which may
represent mild interstitial edema.

Bibasilar atelectasis.

Question trace bilateral pleural effusions.
